# Patient Record
Sex: Female | Born: 1955
Health system: Southern US, Community
[De-identification: ages and names within clinical notes are randomized; demographics above are authoritative.]

## PROBLEM LIST (undated history)

## (undated) DIAGNOSIS — I2510 Atherosclerotic heart disease of native coronary artery without angina pectoris: Secondary | ICD-10-CM

## (undated) DIAGNOSIS — K759 Inflammatory liver disease, unspecified: Secondary | ICD-10-CM

## (undated) DIAGNOSIS — R200 Anesthesia of skin: Secondary | ICD-10-CM

## (undated) DIAGNOSIS — H3320 Serous retinal detachment, unspecified eye: Secondary | ICD-10-CM

## (undated) DIAGNOSIS — I10 Essential (primary) hypertension: Secondary | ICD-10-CM

## (undated) DIAGNOSIS — J342 Deviated nasal septum: Secondary | ICD-10-CM

## (undated) DIAGNOSIS — R112 Nausea with vomiting, unspecified: Secondary | ICD-10-CM

## (undated) DIAGNOSIS — R42 Dizziness and giddiness: Secondary | ICD-10-CM

## (undated) DIAGNOSIS — D649 Anemia, unspecified: Secondary | ICD-10-CM

## (undated) DIAGNOSIS — H469 Unspecified optic neuritis: Secondary | ICD-10-CM

## (undated) DIAGNOSIS — F419 Anxiety disorder, unspecified: Secondary | ICD-10-CM

## (undated) DIAGNOSIS — S83209A Unspecified tear of unspecified meniscus, current injury, unspecified knee, initial encounter: Secondary | ICD-10-CM

## (undated) DIAGNOSIS — G5600 Carpal tunnel syndrome, unspecified upper limb: Secondary | ICD-10-CM

## (undated) DIAGNOSIS — K219 Gastro-esophageal reflux disease without esophagitis: Secondary | ICD-10-CM

## (undated) DIAGNOSIS — R350 Frequency of micturition: Secondary | ICD-10-CM

## (undated) DIAGNOSIS — R413 Other amnesia: Secondary | ICD-10-CM

## (undated) DIAGNOSIS — J189 Pneumonia, unspecified organism: Secondary | ICD-10-CM

## (undated) DIAGNOSIS — Z87442 Personal history of urinary calculi: Secondary | ICD-10-CM

## (undated) DIAGNOSIS — M1990 Unspecified osteoarthritis, unspecified site: Secondary | ICD-10-CM

## (undated) DIAGNOSIS — D219 Benign neoplasm of connective and other soft tissue, unspecified: Secondary | ICD-10-CM

## (undated) DIAGNOSIS — Z9889 Other specified postprocedural states: Secondary | ICD-10-CM

## (undated) DIAGNOSIS — E7800 Pure hypercholesterolemia, unspecified: Secondary | ICD-10-CM

## (undated) DIAGNOSIS — R0600 Dyspnea, unspecified: Secondary | ICD-10-CM

## (undated) DIAGNOSIS — H3530 Unspecified macular degeneration: Secondary | ICD-10-CM

## (undated) DIAGNOSIS — M4800 Spinal stenosis, site unspecified: Secondary | ICD-10-CM

## (undated) DIAGNOSIS — A4902 Methicillin resistant Staphylococcus aureus infection, unspecified site: Secondary | ICD-10-CM

## (undated) HISTORY — DX: Deviated nasal septum: J34.2

## (undated) HISTORY — PX: KNEE SURGERY: SHX244

## (undated) HISTORY — DX: Spinal stenosis, site unspecified: M48.00

## (undated) HISTORY — PX: BACK SURGERY: SHX140

## (undated) HISTORY — DX: Unspecified tear of unspecified meniscus, current injury, unspecified knee, initial encounter: S83.209A

## (undated) HISTORY — DX: Other amnesia: R41.3

## (undated) HISTORY — PX: CATARACT EXTRACTION: SUR2

## (undated) HISTORY — DX: Methicillin resistant Staphylococcus aureus infection, unspecified site: A49.02

## (undated) HISTORY — PX: OTHER SURGICAL HISTORY: SHX169

## (undated) HISTORY — PX: RETINAL DETACHMENT SURGERY: SHX105

## (undated) HISTORY — DX: Benign neoplasm of connective and other soft tissue, unspecified: D21.9

## (undated) HISTORY — DX: Pure hypercholesterolemia, unspecified: E78.00

---

## 2001-03-06 ENCOUNTER — Observation Stay (HOSPITAL_COMMUNITY): Admission: RE | Admit: 2001-03-06 | Discharge: 2001-03-07 | Payer: Self-pay | Admitting: Ophthalmology

## 2001-03-06 ENCOUNTER — Encounter: Payer: Self-pay | Admitting: Ophthalmology

## 2001-03-06 DIAGNOSIS — H3320 Serous retinal detachment, unspecified eye: Secondary | ICD-10-CM

## 2001-03-06 HISTORY — DX: Serous retinal detachment, unspecified eye: H33.20

## 2001-03-06 HISTORY — PX: RETINAL DETACHMENT SURGERY: SHX105

## 2005-10-10 ENCOUNTER — Ambulatory Visit (HOSPITAL_COMMUNITY): Admission: RE | Admit: 2005-10-10 | Discharge: 2005-10-10 | Payer: Self-pay | Admitting: Orthopedic Surgery

## 2006-06-06 ENCOUNTER — Encounter: Admission: RE | Admit: 2006-06-06 | Discharge: 2006-06-06 | Payer: Self-pay | Admitting: Orthopedic Surgery

## 2008-04-30 ENCOUNTER — Encounter: Admission: RE | Admit: 2008-04-30 | Discharge: 2008-04-30 | Payer: Self-pay | Admitting: Internal Medicine

## 2009-06-02 ENCOUNTER — Encounter: Admission: RE | Admit: 2009-06-02 | Discharge: 2009-06-02 | Payer: Self-pay | Admitting: Family Medicine

## 2010-06-06 ENCOUNTER — Encounter: Admission: RE | Admit: 2010-06-06 | Discharge: 2010-06-06 | Payer: Self-pay | Admitting: *Deleted

## 2010-10-25 ENCOUNTER — Other Ambulatory Visit: Payer: Self-pay | Admitting: Orthopedic Surgery

## 2010-10-25 DIAGNOSIS — M545 Low back pain, unspecified: Secondary | ICD-10-CM

## 2010-11-01 ENCOUNTER — Ambulatory Visit
Admission: RE | Admit: 2010-11-01 | Discharge: 2010-11-01 | Disposition: A | Discharge status: Home / Self Care | Payer: Medicare Other | Source: Ambulatory Visit | Attending: Orthopedic Surgery | Admitting: Orthopedic Surgery

## 2010-11-01 DIAGNOSIS — M545 Low back pain: Secondary | ICD-10-CM

## 2010-11-08 ENCOUNTER — Ambulatory Visit
Admission: RE | Admit: 2010-11-08 | Discharge: 2010-11-08 | Disposition: A | Discharge status: Home / Self Care | Payer: Medicare Other | Source: Ambulatory Visit | Attending: Orthopedic Surgery | Admitting: Orthopedic Surgery

## 2011-02-10 NOTE — H&P (Signed)
[place]. [facility]  Patient:    [name], [name]                       MRN: [id number] Adm. Date:  [id number] Disc. Date: [id number] Attending:  Ivor Messier                         History and Physical  HISTORY:  This was an urgent outpatient admission of this 55 year old white female admitted with a rhegmatogenous retinal detachment of the right eye with multiple breaks.  HISTORY OF PRESENT ILLNESS:  This patient began to note the onset of flashing lights and floaters in her right eye over one months duration.  The patient then began to note the onset of a subsequent visual field defect in the right eye inferiorly.  The patient was seen by Dr. Stark Klein, [facility] optometrist, who noted a retinal detachment.  The patient was referred to my office on March 06, 2001 and this diagnosis confirmed.  Arrangements were made for her outpatient admission at that time.  PAST MEDICAL HISTORY:  The patient is in stable general health at the present time, taking Allegra D for allergies.  REVIEW OF SYSTEMS:  No cardiorespiratory complaints.  PHYSICAL EXAMINATION:  GENERAL:  The patient is a pleasant, well-nourished, well-developed white female in acute ocular distress.  VITAL SIGNS:  As recorded on admission.  Blood pressure 114/45, heart rate 85.  HEENT:  Eyes - Visual acuity without correction, less than 20/400 in the right eye, less than 20/400 in the left eye.  With correction, 20/30 in the right eye and 20/20 in the left eye.  Applanation tonometry 16 mm each eye. External ocular and slit lamp examination normal.  Detailed fundal examination dilated with Mydriacyl ophthalmic solution, right eye vitreous clear, superior rhegmatogenous retinal detachment.  Two irregular retinal tears are located at the 11:30 and 12 oclock position.  The macular area is still attached.  The OD detachment descends downward at the edge of the macula.  LUNGS:  Clear  to auscultation and percussion.  HEART:  Normal sinus rhythm.  No cardiomegaly.  No murmurs.  ABDOMEN:  Negative.  EXTREMITIES:  Negative.  ADMISSION DIAGNOSIS:  Rhegmatogenous retinal detachment, right eye.  Multiple defects.  SURGICAL PLAN:  Scleral buckling procedure, right eye, with possible vitrectomy.DD:  04/07/01 TD:  04/07/01 Job: [id number]

## 2011-02-10 NOTE — Op Note (Signed)
[place]. [facility]  Patient:    [name], [name]                       MRN: [id number] Proc. Date: 03/06/01 Adm. Date:  [id number] Disc. Date: [id number] Attending:  Ivor Messier                           Operative Report  PREOPERATIVE DIAGNOSES:  Rhegmatogenous retinal detachment right eye with multiple defects.  POSTOPERATIVE DIAGNOSES:  Rhegmatogenous retinal detachment right eye with multiple defects.  OPERATION: 1. Scleral buckling procedure, right eye, using solid silicone implants #277    and #240. 2. Cryo application. 3. Diathermy application. 4. External drainage of subretinal fluid. 5. Intravitreal C3F8 injection.  SURGEON:  Guadelupe Sabin, M.D.  ASSISTANT:  Nurse.  ANESTHESIA:  General.  OPHTHALMOSCOPY:  As previously described.  OPERATIVE PROCEDURE:  After the patient was prepped and draped, lid traction sutures were placed in the right upper and lower lids.  A lid speculum was inserted.  A peritomy was performed adjacent to the limbus 360 degrees.  The rectus muscles were isolated and looped with 4-0 silk traction sutures. The sclera was inspected and felt to be in satisfactory condition for the upper post lamellar scleral dissection.  Localization of the retinal breaks at the 11:30 and 12 oclock positions was verified and each tear treated with direct Cryo application.  It was then elected to perform lamellar scleral dissection from the 10 to 2 oclock position, the bed measuring 9 mm in width.  Light diathermy applications were applied.  A total of four 4-0 green Mersilene sutures were used to close the flaps loosely over a trimmed #277 solid silicone implant.  A #240 solid silicone encircling band was tied with two sutures at the 7:30 and 8 oclock position.  Anchoring sutures of 5-0 white Dacron were placed at the 8 and 4 oclock position.  The fundus was inspected and it was elected to drain fluid through the sclera at  the 11 oclock position.  An incision was made through the inner scleral lamella.  The choroid was treated with direct diathermy application and then perforated with the pen electrode.  A moderate amount of clear subretinal fluid was drained and then stopped.  The scleral buckle sutures were then pulled up, creating a superior indentation.  The tension of the encircling band was adjusted.  There was still remaining some residual folds and fluid on the implant surface and the tears appeared to be slightly still open.  It was therefore elected to perform an intravitreal C3F8 injection of 0.4 cc of 100% C3F8 after a paracentesis had been performed to lower the intraocular pressure further and the patient given Diamox 500 mg.  Indirect ophthalmoscopy revealed a good bubble created with the C3F8.  The intraocular pressure was monitored and optic nerve perfusion was monitored with indirect ophthalmoscopy intermittently.  There always appeared to be sufficient retinal artery perfusion.  It was then elected to close.  Tenons capsule was pulled forward and the four quadrants were tied as a separate layer with a 6-0 chromic CT gut suture.  The conjunctiva was then pulled forward and closed with a running 6-0 chromic cat gut suture.  Depo, garamycin and Celestone were injected in the subtenons space inferiorly.  A light patch and protector shield was applied to the right eye with Maxitrol and Atropine ointment.  DURATION OF PROCEDURE:  1-1/2 hours.  DISPOSITION:  The patient tolerated the procedure well in general and left the operating room for the recovery room in good condition. DD:  04/07/01 TD:  04/07/01 Job: [id number]

## 2011-02-10 NOTE — Discharge Summary (Signed)
[place]. [facility]  Patient:    [name], [name]                       MRN: [id number] Adm. Date:  [id number] Disc. Date: [id number] Attending:  Ivor Messier                           Discharge Summary  HISTORY OF PRESENT ILLNESS:  This was an urgent outpatient admission of this 55 year old white female admitted with a rhegmatogenous retinal detachment of the right eye with multiple breaks (see detailed admission history and physical).  HOSPITAL COURSE:  The patient was evaluated preoperatively and felt to be in satisfactory condition for the proposed surgery.  She therefore was taken into the operating room, where a complex scleral buckling procedure was performed using solid silicone implants with cryo application, diathermy application, external drainage of subretinal fluid and intravitreal C3F8 injection.  The patient tolerated the 1-1/2 hour procedure under general anesthesia well and was taken to the recovery room and subsequently to the 23-hour observation unit.  The patient was seen the following morning and felt to be doing satisfactory.  The residual subretinal fluid had absorbed and a good, smooth buckling indentation created superiorly.  The retinal breaks appeared to be in good position on the buckle.  It was felt that the patient could be discharged on limited activity and topical medications consisting of TobraDex and Cyclomydril ophthalmic solutions, one drop q.i.d. five minutes apart and Maxitrol and Atropine ointment at bed time.  CONDITION ON DISCHARGE:  Improved.  DISCHARGE DIAGNOSIS:  Rhegmatogenous retinal detachment right eye with multiple defects.  FOLLOW-UP APPOINTMENT:  In my office in 5-7 days. DD:  04/07/01 TD:  04/07/01 Job: [id number]

## 2011-05-02 ENCOUNTER — Other Ambulatory Visit: Payer: Self-pay | Admitting: Family Medicine

## 2011-05-02 DIAGNOSIS — Z1231 Encounter for screening mammogram for malignant neoplasm of breast: Secondary | ICD-10-CM

## 2011-06-08 ENCOUNTER — Ambulatory Visit
Admission: RE | Admit: 2011-06-08 | Discharge: 2011-06-08 | Disposition: A | Discharge status: Home / Self Care | Payer: Medicare Other | Source: Ambulatory Visit | Attending: Family Medicine | Admitting: Family Medicine

## 2011-06-08 DIAGNOSIS — Z1231 Encounter for screening mammogram for malignant neoplasm of breast: Secondary | ICD-10-CM

## 2011-12-12 DIAGNOSIS — H02839 Dermatochalasis of unspecified eye, unspecified eyelid: Secondary | ICD-10-CM | POA: Insufficient documentation

## 2011-12-12 DIAGNOSIS — Q078 Other specified congenital malformations of nervous system: Secondary | ICD-10-CM | POA: Diagnosis present

## 2011-12-12 DIAGNOSIS — H5210 Myopia, unspecified eye: Secondary | ICD-10-CM | POA: Insufficient documentation

## 2011-12-12 DIAGNOSIS — H43819 Vitreous degeneration, unspecified eye: Secondary | ICD-10-CM | POA: Insufficient documentation

## 2011-12-12 DIAGNOSIS — H02889 Meibomian gland dysfunction of unspecified eye, unspecified eyelid: Secondary | ICD-10-CM | POA: Insufficient documentation

## 2011-12-12 DIAGNOSIS — Z8669 Personal history of other diseases of the nervous system and sense organs: Secondary | ICD-10-CM | POA: Insufficient documentation

## 2011-12-12 DIAGNOSIS — H01009 Unspecified blepharitis unspecified eye, unspecified eyelid: Secondary | ICD-10-CM | POA: Insufficient documentation

## 2011-12-12 DIAGNOSIS — H5213 Myopia, bilateral: Secondary | ICD-10-CM | POA: Insufficient documentation

## 2011-12-12 DIAGNOSIS — H25819 Combined forms of age-related cataract, unspecified eye: Secondary | ICD-10-CM | POA: Insufficient documentation

## 2012-04-30 ENCOUNTER — Other Ambulatory Visit: Payer: Self-pay | Admitting: Internal Medicine

## 2012-04-30 DIAGNOSIS — Z1231 Encounter for screening mammogram for malignant neoplasm of breast: Secondary | ICD-10-CM

## 2012-06-20 ENCOUNTER — Ambulatory Visit
Admission: RE | Admit: 2012-06-20 | Discharge: 2012-06-20 | Disposition: A | Discharge status: Home / Self Care | Payer: Medicare Other | Source: Ambulatory Visit | Attending: Internal Medicine | Admitting: Internal Medicine

## 2012-06-20 DIAGNOSIS — Z1231 Encounter for screening mammogram for malignant neoplasm of breast: Secondary | ICD-10-CM

## 2013-05-20 ENCOUNTER — Other Ambulatory Visit: Payer: Self-pay

## 2013-05-20 DIAGNOSIS — Z1231 Encounter for screening mammogram for malignant neoplasm of breast: Secondary | ICD-10-CM

## 2013-06-24 ENCOUNTER — Ambulatory Visit
Admission: RE | Admit: 2013-06-24 | Discharge: 2013-06-24 | Disposition: A | Discharge status: Home / Self Care | Payer: Medicare Other | Source: Ambulatory Visit

## 2013-06-24 DIAGNOSIS — Z1231 Encounter for screening mammogram for malignant neoplasm of breast: Secondary | ICD-10-CM

## 2013-12-22 DIAGNOSIS — Z961 Presence of intraocular lens: Secondary | ICD-10-CM | POA: Insufficient documentation

## 2014-05-25 ENCOUNTER — Other Ambulatory Visit: Payer: Self-pay

## 2014-05-25 DIAGNOSIS — Z1231 Encounter for screening mammogram for malignant neoplasm of breast: Secondary | ICD-10-CM

## 2014-06-25 ENCOUNTER — Ambulatory Visit
Admission: RE | Admit: 2014-06-25 | Discharge: 2014-06-25 | Disposition: A | Discharge status: Home / Self Care | Payer: 59 | Source: Ambulatory Visit

## 2014-06-25 DIAGNOSIS — Z1231 Encounter for screening mammogram for malignant neoplasm of breast: Secondary | ICD-10-CM

## 2015-01-21 DIAGNOSIS — M79606 Pain in leg, unspecified: Secondary | ICD-10-CM | POA: Insufficient documentation

## 2015-01-21 DIAGNOSIS — I209 Angina pectoris, unspecified: Secondary | ICD-10-CM | POA: Insufficient documentation

## 2015-01-21 DIAGNOSIS — R079 Chest pain, unspecified: Secondary | ICD-10-CM | POA: Insufficient documentation

## 2015-05-28 ENCOUNTER — Other Ambulatory Visit: Payer: Self-pay

## 2015-05-28 DIAGNOSIS — Z1231 Encounter for screening mammogram for malignant neoplasm of breast: Secondary | ICD-10-CM

## 2015-06-28 ENCOUNTER — Ambulatory Visit
Admission: RE | Admit: 2015-06-28 | Discharge: 2015-06-28 | Disposition: A | Discharge status: Home / Self Care | Payer: Medicare Other | Source: Ambulatory Visit

## 2015-06-28 DIAGNOSIS — Z1231 Encounter for screening mammogram for malignant neoplasm of breast: Secondary | ICD-10-CM

## 2015-07-09 ENCOUNTER — Encounter (HOSPITAL_COMMUNITY): Payer: Self-pay | Admitting: Nurse Practitioner

## 2015-07-09 ENCOUNTER — Inpatient Hospital Stay (HOSPITAL_COMMUNITY)
Admission: EM | Admit: 2015-07-09 | Discharge: 2015-07-13 | DRG: 123 | Disposition: A | Discharge status: Home / Self Care | Payer: Medicare Other | Attending: Internal Medicine | Admitting: Internal Medicine

## 2015-07-09 ENCOUNTER — Emergency Department (HOSPITAL_COMMUNITY): Payer: Medicare Other

## 2015-07-09 DIAGNOSIS — I10 Essential (primary) hypertension: Secondary | ICD-10-CM | POA: Diagnosis present

## 2015-07-09 DIAGNOSIS — R519 Headache, unspecified: Secondary | ICD-10-CM

## 2015-07-09 DIAGNOSIS — Q078 Other specified congenital malformations of nervous system: Secondary | ICD-10-CM | POA: Diagnosis present

## 2015-07-09 DIAGNOSIS — H47011 Ischemic optic neuropathy, right eye: Principal | ICD-10-CM | POA: Diagnosis present

## 2015-07-09 DIAGNOSIS — I739 Peripheral vascular disease, unspecified: Secondary | ICD-10-CM | POA: Diagnosis present

## 2015-07-09 DIAGNOSIS — H547 Unspecified visual loss: Secondary | ICD-10-CM | POA: Diagnosis not present

## 2015-07-09 DIAGNOSIS — H468 Other optic neuritis: Secondary | ICD-10-CM | POA: Diagnosis present

## 2015-07-09 DIAGNOSIS — M1990 Unspecified osteoarthritis, unspecified site: Secondary | ICD-10-CM | POA: Diagnosis present

## 2015-07-09 DIAGNOSIS — H469 Unspecified optic neuritis: Secondary | ICD-10-CM

## 2015-07-09 DIAGNOSIS — R51 Headache: Secondary | ICD-10-CM

## 2015-07-09 DIAGNOSIS — H538 Other visual disturbances: Secondary | ICD-10-CM

## 2015-07-09 HISTORY — DX: Unspecified osteoarthritis, unspecified site: M19.90

## 2015-07-09 HISTORY — DX: Serous retinal detachment, unspecified eye: H33.20

## 2015-07-09 HISTORY — DX: Essential (primary) hypertension: I10

## 2015-07-09 LAB — BASIC METABOLIC PANEL
ANION GAP: 7 (ref 5–15)
BUN: 25 mg/dL — AB (ref 6–20)
CHLORIDE: 104 mmol/L (ref 101–111)
CO2: 26 mmol/L (ref 22–32)
CREATININE: 0.8 mg/dL (ref 0.44–1.00)
Calcium: 9.6 mg/dL (ref 8.9–10.3)
GFR calc Af Amer: >60 mL/min (ref >60)
GFR calc non Af Amer: >60 mL/min (ref >60)
Glucose, Bld: 115 mg/dL — ABNORMAL HIGH (ref 65–99)
POTASSIUM: 4.2 mmol/L (ref 3.5–5.1)
SODIUM: 137 mmol/L (ref 135–145)

## 2015-07-09 LAB — CBC WITH DIFFERENTIAL/PLATELET
BASOS ABS: 0.1 10*3/uL (ref 0.0–0.1)
Basophils Relative: 1 %
EOS ABS: 0.5 10*3/uL (ref 0.0–0.7)
EOS PCT: 5 %
HCT: 40.7 % (ref 36.0–46.0)
HEMOGLOBIN: 13.5 g/dL (ref 12.0–15.0)
LYMPHS PCT: 31 %
Lymphs Abs: 2.9 10*3/uL (ref 0.7–4.0)
MCH: 31.6 pg (ref 26.0–34.0)
MCHC: 33.2 g/dL (ref 30.0–36.0)
MCV: 95.3 fL (ref 78.0–100.0)
Monocytes Absolute: 0.7 10*3/uL (ref 0.1–1.0)
Monocytes Relative: 8 %
NEUTROS PCT: 55 %
Neutro Abs: 5.1 10*3/uL (ref 1.7–7.7)
PLATELETS: 253 10*3/uL (ref 150–400)
RBC: 4.27 MIL/uL (ref 3.87–5.11)
RDW: 13.8 % (ref 11.5–15.5)
WBC: 9.2 10*3/uL (ref 4.0–10.5)

## 2015-07-09 MED ORDER — LORAZEPAM 1 MG PO TABS
1.0000 mg | ORAL_TABLET | Freq: Once | ORAL | Status: AC
  Administered 2015-07-09: 1 mg via ORAL
  Filled 2015-07-09: qty 1

## 2015-07-09 NOTE — ED Notes (Signed)
MD at bedside. 

## 2015-07-09 NOTE — ED Notes (Signed)
She noticed this am when she woke she had a sensation that something was in her R eye, then approximately 1700 today she lost vision in her R eye, this has remained since onset. She went to her eye doctor and they sent for r/o optic neuritis but told retina was intact. shes had retinal detachment in this eye in the past. seh describes a gray film over R eye that she can only see "bits and pieces through". She is A&Ox4, speech is clear, grips = bilaterally.

## 2015-07-09 NOTE — ED Provider Notes (Signed)
CSN: [id number]     Arrival date & time 07/09/15  1829 History   First MD Initiated Contact with Patient 07/09/15 1934     Chief Complaint  Patient presents with  . Loss of Vision     (Consider location/radiation/quality/duration/timing/severity/associated sxs/prior Treatment) HPI Comments: Patient presents with vision changes in the right eye for the past day. She describes a gray film with "bits and pieces showing through". She saw her ophthalmologist Dr. Alethia Berthold who performed a complete exam and told her there was no retinal detachment. She has had a retinal detachment in the past and had surgery. She denies any eye pain. She was sent over to rule out optic neuritis. She denies any fevers, chills, nausea or vomiting. She denies any headache. Her ophthalmologist was also concerned about her history of tick bites this past summer and possible Lyme disease.  She describes a gray film over the vision in her right eye. She was told that her macula and retinal were normal by her ophthalmologist.  The history is provided by the patient.    Past Medical History  Diagnosis Date  . Retinal detachment   . Hypertension   . Arthritis    No past surgical history on file. No family history on file. Social History  Substance Use Topics  . Smoking status: Never Smoker   . Smokeless tobacco: Not on file  . Alcohol Use: No   OB History    No data available     Review of Systems  Constitutional: Negative for fever, activity change, appetite change and fatigue.  HENT: Negative for congestion and postnasal drip.   Eyes: Positive for visual disturbance. Negative for pain.  Respiratory: Negative for cough, chest tightness and shortness of breath.   Cardiovascular: Negative for chest pain.  Gastrointestinal: Negative for nausea, vomiting and abdominal pain.  Genitourinary: Negative for dysuria, hematuria, vaginal bleeding and vaginal discharge.  Musculoskeletal: Negative for myalgias and  arthralgias.  Skin: Negative for rash.  Neurological: Negative for dizziness, weakness and headaches.  A complete 10 system review of systems was obtained and all systems are negative except as noted in the HPI and PMH.      Allergies  Review of patient's allergies indicates no known allergies.  Home Medications   Prior to Admission medications   Medication Sig Start Date End Date Taking? Authorizing Provider  CALCIUM PO Take 1 tablet by mouth daily.   Yes Historical Provider, MD  carvedilol (COREG) 12.5 MG tablet Take 12.5 mg by mouth 2 (two) times daily with a meal.   Yes Historical Provider, MD  diclofenac (VOLTAREN) 75 MG EC tablet Take 75 mg by mouth daily.   Yes Historical Provider, MD  ibuprofen (ADVIL,MOTRIN) 200 MG tablet Take 200 mg by mouth every 6 (six) hours as needed. For knee pain   Yes Historical Provider, MD  Multiple Vitamin (MULTIVITAMIN WITH MINERALS) TABS tablet Take 1 tablet by mouth daily.   Yes Historical Provider, MD   BP 148/79 mmHg  Pulse 76  Temp(Src) 98 F (36.7 C) (Oral)  Resp 20  SpO2 98% Physical Exam  Constitutional: She is oriented to person, place, and time. She appears well-developed and well-nourished. No distress.  HENT:  Head: Normocephalic and atraumatic.  Mouth/Throat: Oropharynx is clear and moist. No oropharyngeal exudate.  Eyes: Conjunctivae and EOM are normal. Pupils are equal, round, and reactive to light.  Fundoscopic exam:      The right eye shows no papilledema.  The left eye shows no papilledema.  Pharmacologically dilated. Funduscopic exam appears to be normal without papilledema.  Neck: Normal range of motion. Neck supple.  No meningismus.  Cardiovascular: Normal rate, regular rhythm, normal heart sounds and intact distal pulses.   No murmur heard. Pulmonary/Chest: Effort normal and breath sounds normal. No respiratory distress.  Abdominal: Soft. There is no tenderness. There is no rebound and no guarding.   Musculoskeletal: Normal range of motion. She exhibits no edema or tenderness.  Neurological: She is alert and oriented to person, place, and time. No cranial nerve deficit. She exhibits normal muscle tone. Coordination normal.  No ataxia on finger to nose bilaterally. No pronator drift. 5/5 strength throughout. CN 2-12 intact. Negative Romberg. Equal grip strength. Sensation intact. Gait is normal.   Skin: Skin is warm.  Psychiatric: She has a normal mood and affect. Her behavior is normal.  Nursing note and vitals reviewed.   ED Course  Procedures (including critical care time) Labs Review Labs Reviewed  BASIC METABOLIC PANEL - Abnormal; Notable for the following:    Glucose, Bld 115 (*)    BUN 25 (*)    All other components within normal limits  CBC WITH DIFFERENTIAL/PLATELET  B. BURGDORFI ANTIBODIES    Imaging Review No results found. I have personally reviewed and evaluated these images and lab results as part of my medical decision-making.   EKG Interpretation None      MDM   Final diagnoses:  Vision loss   Vision change in right eye with concern for optic neuritis. Retina normal per ophthalmology exam earlier today.  MRI will be obtained to evaluate for possible optic neuritis. MS considered as well. No retinal detachment seen on bedside ultrasound.  No other neurological findings to suggest acute stroke.  MRI brain and orbits pending at time of sign out to Dr. Claudine Mouton. Will likely need neurology consult.  Dr. Doy Mince aware.   EMERGENCY DEPARTMENT [place] OCULAR EXAM "Study: Limited Ultrasound of Orbit "  INDICATIONS: Vision loss and Floaters/Flashes  Linear probe utilized to obtain images in both long and short axis of the orbit having the patient look left and right if possible.  PERFORMED BY: Myself  IMAGES ARCHIVED?: Yes  LIMITATIONS: none  VIEWS USED: Right orbit  INTERPRETATION: No retinal detachment, Lens in proper position    Ezequiel Essex,  MD 07/10/15 [phone]

## 2015-07-10 ENCOUNTER — Encounter (HOSPITAL_COMMUNITY): Payer: Self-pay | Admitting: Family Medicine

## 2015-07-10 DIAGNOSIS — I10 Essential (primary) hypertension: Secondary | ICD-10-CM | POA: Diagnosis present

## 2015-07-10 DIAGNOSIS — I739 Peripheral vascular disease, unspecified: Secondary | ICD-10-CM | POA: Diagnosis present

## 2015-07-10 DIAGNOSIS — M1990 Unspecified osteoarthritis, unspecified site: Secondary | ICD-10-CM | POA: Diagnosis present

## 2015-07-10 DIAGNOSIS — H538 Other visual disturbances: Secondary | ICD-10-CM | POA: Diagnosis not present

## 2015-07-10 DIAGNOSIS — H47011 Ischemic optic neuropathy, right eye: Secondary | ICD-10-CM | POA: Diagnosis present

## 2015-07-10 DIAGNOSIS — H547 Unspecified visual loss: Secondary | ICD-10-CM | POA: Diagnosis present

## 2015-07-10 DIAGNOSIS — H469 Unspecified optic neuritis: Secondary | ICD-10-CM | POA: Diagnosis not present

## 2015-07-10 DIAGNOSIS — H468 Other optic neuritis: Secondary | ICD-10-CM | POA: Diagnosis present

## 2015-07-10 HISTORY — DX: Essential (primary) hypertension: I10

## 2015-07-10 LAB — SEDIMENTATION RATE: Sed Rate: 35 mm/hr — ABNORMAL HIGH (ref 0–22)

## 2015-07-10 MED ORDER — GADOBENATE DIMEGLUMINE 529 MG/ML IV SOLN
20.0000 mL | Freq: Once | INTRAVENOUS | Status: AC | PRN
  Administered 2015-07-10: 18 mL via INTRAVENOUS

## 2015-07-10 MED ORDER — PANTOPRAZOLE SODIUM 40 MG PO TBEC
40.0000 mg | DELAYED_RELEASE_TABLET | Freq: Two times a day (BID) | ORAL | Status: DC
  Administered 2015-07-10 – 2015-07-13 (×7): 40 mg via ORAL
  Filled 2015-07-10 (×8): qty 1

## 2015-07-10 MED ORDER — ENOXAPARIN SODIUM 40 MG/0.4ML ~~LOC~~ SOLN
40.0000 mg | SUBCUTANEOUS | Status: DC
Start: 2015-07-10 — End: 2015-07-13
  Administered 2015-07-10 – 2015-07-13 (×4): 40 mg via SUBCUTANEOUS
  Filled 2015-07-10 (×4): qty 0.4

## 2015-07-10 MED ORDER — ACETAMINOPHEN 325 MG PO TABS
650.0000 mg | ORAL_TABLET | Freq: Four times a day (QID) | ORAL | Status: DC | PRN
  Administered 2015-07-10: 650 mg via ORAL
  Filled 2015-07-10 (×2): qty 2

## 2015-07-10 MED ORDER — DICLOFENAC SODIUM 75 MG PO TBEC
75.0000 mg | DELAYED_RELEASE_TABLET | Freq: Every day | ORAL | Status: DC
  Filled 2015-07-10: qty 1

## 2015-07-10 MED ORDER — SODIUM CHLORIDE 0.9 % IV SOLN
250.0000 mg | Freq: Four times a day (QID) | INTRAVENOUS | Status: DC
  Administered 2015-07-10 – 2015-07-12 (×8): 250 mg via INTRAVENOUS
  Filled 2015-07-10 (×12): qty 2

## 2015-07-10 MED ORDER — CARVEDILOL 12.5 MG PO TABS
12.5000 mg | ORAL_TABLET | Freq: Two times a day (BID) | ORAL | Status: DC
Start: 2015-07-10 — End: 2015-07-13
  Administered 2015-07-10 – 2015-07-13 (×7): 12.5 mg via ORAL
  Filled 2015-07-10 (×7): qty 1

## 2015-07-10 NOTE — Progress Notes (Signed)
Patient trans in from ED to [place]. On arrival, alert and oriented and ambulatory. Here for right eye vision changes. Oriented to the room and call bell placed within reach. Will keep monitoring.

## 2015-07-10 NOTE — H&P (Signed)
History and Physical  [name]  LZJ:[id number]  DOB: 01/16/1956  DOA: 07/09/2015  Referring physician: Everlene Balls, MD PCP: Suzanna Obey, MD   Chief Complaint: Vision change  HPI: [name] is a 58 y.o. female with a past medical history significant for HTN and retinal detachment who presents with vision changes of right eye.  The patient was in her normal state of health until today around noon when she abruptly onset of visual defects in her right eye. This was painless, look like "gray patches" in front of things, and seem to be evolving. It was not associated with headache, fever, jaw pain, joint pain, slurred speech, focal weakness. She went to an ophthalmologist as records are not available to [place] who recommended that she present to the ER for urgent MRI.  In the ED, this MRI showed no stroke, white matter lesions, or evidence of optic neuritis. Evaluated by neurology who felt that her clinical exam was consistent with optic neuritis and warranted admission and empiric treatment with IV steroids.   Review of Systems:  Patient seen 4:58 AM on 07/10/2015. Pt complains of gray spots in front of her eyes. All other systems negative except as just noted or noted in the history of present illness.  Past Medical History  Diagnosis Date  . Retinal detachment   . Hypertension   . Arthritis   The above past medical history was reviewed.  History reviewed. No pertinent past surgical history.The above surgical history was reviewed.  Social History: Patient lives with her sister. She is a former Radio producer. She has never smoked or drunk.    No Known Allergies  Family History  Problem Relation Age of Onset  . Stroke Mother   . Arrhythmia Mother   . Stroke Maternal Grandfather   . Stroke Maternal Grandmother     Prior to Admission medications   Medication Sig Start Date End Date Taking? Authorizing Provider  CALCIUM PO Take 1 tablet by mouth daily.   Yes Historical  Provider, MD  carvedilol (COREG) 12.5 MG tablet Take 12.5 mg by mouth 2 (two) times daily with a meal.   Yes Historical Provider, MD  diclofenac (VOLTAREN) 75 MG EC tablet Take 75 mg by mouth daily.   Yes Historical Provider, MD  ibuprofen (ADVIL,MOTRIN) 200 MG tablet Take 200 mg by mouth every 6 (six) hours as needed. For knee pain   Yes Historical Provider, MD  Multiple Vitamin (MULTIVITAMIN WITH MINERALS) TABS tablet Take 1 tablet by mouth daily.   Yes Historical Provider, MD    Physical Exam: BP 176/84 mmHg  Pulse 76  Temp(Src) 98.1 F (36.7 C) (Oral)  Resp 16  SpO2 98% General appearance: Well-developed, adult female, alert and in no distress.  Responds appropriately to questions.   Eyes: Sclerae normal without icterus, conjunctiva pink, lids and lashes normal.  PERRL and EOMI.   Nose: No deformity, discharge, or epistaxis.   Lymph: No cervical, supraclavicular or axillary lymphadenopathy. Skin: Warm and dry.  No jaundice.  No suspicious rashes or lesions. Cardiac: RRR, nl S1-S2, no murmurs appreciated.   Respiratory: Normal respiratory rate and rhythm.  CTAB without rales or wheezes. Abdomen: BS present.  Abdomen soft without rigidity.  No TTP or rebound all quadrants. No ascites, distension.   Neuro: Sensorium intact. Moves all extremities equally and with normal coordination.    Psych: Appropriate affect.  Speech normal. Thought content/process linear/appropriate.  No evidence of aural or visual hallucinations or delusions.  Labs on Admission:  The metabolic panel is all within normal limits. The complete blood count is also all within normal limits.   Radiological Exams on Admission:  Mr Lanae Crumbly and Kizzie Fantasia Contrast 07/10/2015 IMPRESSION:  1. No evidence for acute optic neuritis or other acute optic nerve pathology.  2. Sequela of prior scleral banding at the right globe for presumed previous retinal detachment. No findings to suggest acute retinal detachment on  this examination.  3. No other acute intracranial process.  4. Mild chronic small vessel ischemic disease.     Assessment/Plan  1. Optic neuritis, suspected:  This is new.  There are no findings of MS.  The patient was evaluated by ophthalmology earlier today who did not feel that she had a retinal detachment again. Empiric treatment for optic neuritis. -IV methylprednisolone 250 mg every 6 for 3 days -We will rule out GCA -Will rule out Lyme, sarcoid  2. HTN:  Not controlled. -Continue home carvedilol -Monitored overnight and add second agent if needed     DVT PPx: Lovenox Diet: Regular Consultants: Neurology Code Status: Full  Disposition Plan:  At the time of admission, it appears that the appropriate admission status for this patient is INPATIENT. This is judged to be reasonable and necessary in order to provide the required intensity of service to ensure the patient's treatment for potentially organ threatening illness. At the time of admission, we expect her to receive 3 days of IV steroids before discharge.  Edwin Dada [facility] Pager [phone]

## 2015-07-10 NOTE — Progress Notes (Addendum)
Patient was admitted earlier during the day for worsening vision in right eye, H&P, neurology consult, orders, labs, imaging, was reviewed, patient was seen and examined.  Physical exam - Awake alert 3 - Pink conjunctiva, anicteric sclera,PERRLQ - S1-S2 plus, no rubs or gallops - Clear air entry bilaterally, no wheezing - Abdomen soft, nontender, nondistended - Extremity no edema no clubbing no cyanosis  Patient was seen by neurology, started empirically on IV steroids for suspicion of optic neuritis, MRI brain, MRI orbits also with no acute optic nerve findings or optic neuritis, Lyme  Disease Ab pending as she endorses tick bites, follow on ANA, ACE level, ESR is mildly elevated, ophthalmology service were consulted for further evaluation, - Patient started on CBG and PPI as she is on steroids. Phillips Climes MD [facility] Pager [phone]

## 2015-07-10 NOTE — Consult Note (Signed)
Reason for Consult:Decreased vision Referring Physician: Oni  CC: Decreased vision  HPI: [name] is an 59 y.o. female with a history of a right retinal detachment was watching television on yesterday around midday and began to notice that her vision was not as usual.  When she closed her left eye she noticed that it was as if she was looking at a jigsaw puzzle-some portions of her vision were visual and some portions were not.  Also as she looked around flashes of color were noticeable.  The patient was concerned that she had another retinal detachment.  She saw an ophthalmologist today.  Those records are not available but she as concerned about an optic neuritis.    Past Medical History  Diagnosis Date  . Retinal detachment   . Hypertension   . Arthritis     No past surgical history on file.  Family history: Mother deceased.  She has multiple strokes.  Her maternal grandmother and grandfather died of strokes as well.  Her father died of a MI.    Social History:  reports that she has never smoked. She does not have any smokeless tobacco history on file. She reports that she does not drink alcohol or use illicit drugs.  No Known Allergies  Medications: I have reviewed the patient's current medications. Prior to Admission:  Prior to Admission medications   Medication Sig Start Date End Date Taking? Authorizing Provider  CALCIUM PO Take 1 tablet by mouth daily.   Yes Historical Provider, MD  carvedilol (COREG) 12.5 MG tablet Take 12.5 mg by mouth 2 (two) times daily with a meal.   Yes Historical Provider, MD  diclofenac (VOLTAREN) 75 MG EC tablet Take 75 mg by mouth daily.   Yes Historical Provider, MD  ibuprofen (ADVIL,MOTRIN) 200 MG tablet Take 200 mg by mouth every 6 (six) hours as needed. For knee pain   Yes Historical Provider, MD  Multiple Vitamin (MULTIVITAMIN WITH MINERALS) TABS tablet Take 1 tablet by mouth daily.   Yes Historical Provider, MD    ROS: History obtained  from the patient  General ROS: negative for - chills, fatigue, fever, night sweats, weight gain or weight loss Psychological ROS: negative for - behavioral disorder, hallucinations, memory difficulties, mood swings or suicidal ideation Ophthalmic ROS: decreased vision ENT ROS: negative for - epistaxis, nasal discharge, oral lesions, sore throat, tinnitus or vertigo Allergy and Immunology ROS: negative for - hives or itchy/watery eyes Hematological and Lymphatic ROS: negative for - bleeding problems, bruising or swollen lymph nodes Endocrine ROS: negative for - galactorrhea, hair pattern changes, polydipsia/polyuria or temperature intolerance Respiratory ROS: negative for - cough, hemoptysis, shortness of breath or wheezing Cardiovascular ROS: bilateral lower extremity edema  Gastrointestinal ROS: negative for - abdominal pain, diarrhea, hematemesis, nausea/vomiting or stool incontinence Genito-Urinary ROS: negative for - dysuria, hematuria, incontinence or urinary frequency/urgency Musculoskeletal ROS: negative for - joint pain Neurological ROS: as noted in HPI Dermatological ROS: negative for rash and skin lesion changes  Physical Examination: Blood pressure 158/77, pulse 75, temperature 97.7 F (36.5 C), temperature source Oral, resp. rate 20, SpO2 98 %.  HEENT-  Normocephalic, no lesions, without obvious abnormality.  Normal external eye and conjunctiva.  Normal TM's bilaterally.  Normal auditory canals and external ears. Normal external nose, mucus membranes and septum.  Normal pharynx. Cardiovascular- S1, S2 normal, pulses palpable throughout   Lungs- chest clear, no wheezing, rales, normal symmetric air entry Abdomen- soft, non-tender; bowel sounds normal; no masses,  no  organomegaly Extremities- mild BLE edema Lymph-no adenopathy palpable Musculoskeletal-left knee tenderness Skin-warm and dry, no hyperpigmentation, vitiligo, or suspicious lesions  Neurological Examination Mental  Status: Alert, oriented, thought content appropriate.  Speech fluent without evidence of aphasia.  Able to follow 3 step commands without difficulty. Cranial Nerves: II: Discs flat bilaterally; Visual acuity both eyes tested at 20/30, right eye at 20/400, pupils equal, round, reactive to light and accommodation.  No APD III,IV, VI: ptosis not present, extra-ocular motions intact bilaterally V,VII: smile symmetric, facial light touch sensation normal bilaterally VIII: hearing normal bilaterally IX,X: gag reflex present XI: bilateral shoulder shrug XII: midline tongue extension Motor: Right : Upper extremity   5/5    Left:     Upper extremity   5/5  Lower extremity   5/5     Lower extremity   5/5 Tone and bulk:normal tone throughout; no atrophy noted Sensory: Pinprick and light touch intact throughout, bilaterally Deep Tendon Reflexes: 2+ and symmetric throughout Plantars: Right: downgoing   Left: downgoing Cerebellar: normal finger-to-nose and normal heel-to-shin testing bilaterally Gait: not tested due to safety concerns   Laboratory Studies:   Basic Metabolic Panel:  Recent Labs Lab 07/09/15 1957  NA 137  K 4.2  CL 104  CO2 26  GLUCOSE 115*  BUN 25*  CREATININE 0.80  CALCIUM 9.6    Liver Function Tests: No results for input(s): AST, ALT, ALKPHOS, BILITOT, PROT, ALBUMIN in the last 168 hours. No results for input(s): LIPASE, AMYLASE in the last 168 hours. No results for input(s): AMMONIA in the last 168 hours.  CBC:  Recent Labs Lab 07/09/15 1957  WBC 9.2  NEUTROABS 5.1  HGB 13.5  HCT 40.7  MCV 95.3  PLT 253    Cardiac Enzymes: No results for input(s): CKTOTAL, CKMB, CKMBINDEX, TROPONINI in the last 168 hours.  BNP: Invalid input(s): POCBNP  CBG: No results for input(s): GLUCAP in the last 168 hours.  Microbiology: No results found for this or any previous visit.  Coagulation Studies: No results for input(s): LABPROT, INR in the last 72  hours.  Urinalysis: No results for input(s): COLORURINE, LABSPEC, PHURINE, GLUCOSEU, HGBUR, BILIRUBINUR, KETONESUR, PROTEINUR, UROBILINOGEN, NITRITE, LEUKOCYTESUR in the last 168 hours.  Invalid input(s): APPERANCEUR  Lipid Panel:  No results found for: CHOL, TRIG, HDL, CHOLHDL, VLDL, LDLCALC  HgbA1C: No results found for: HGBA1C  Urine Drug Screen:  No results found for: LABOPIA, COCAINSCRNUR, LABBENZ, AMPHETMU, THCU, LABBARB  Alcohol Level: No results for input(s): ETH in the last 168 hours.   Imaging: Mr Jeri Cos LY Contrast  07/10/2015  CLINICAL DATA:  Initial valuation for acute visualized in right eye. History of retinal detachment, status post repair. EXAM: MRI HEAD WITHOUT AND WITH CONTRAST TECHNIQUE: Multiplanar, multiecho pulse sequences of the brain and surrounding structures were obtained without and with intravenous contrast. CONTRAST:  70m MULTIHANCE GADOBENATE DIMEGLUMINE 529 MG/ML IV SOLN COMPARISON:  None. FINDINGS: No abnormal foci of restricted diffusion to suggest acute intracranial infarct. Gray-white matter differentiation maintained. Normal intravascular flow voids are preserved. No acute or chronic intracranial hemorrhage. Cerebral volume within normal limits for patient age. Scattered T2/FLAIR hyperintensity within the periventricular, deep, and subcortical white matter present, nonspecific, but likely related to chronic small vessel ischemic disease. No mass lesion, midline shift or mass effect. No hydrocephalus. No extra-axial fluid collection. Craniocervical junction within normal limits. Pituitary gland normal. Dedicated views of the orbits demonstrate evidence of prior scleral banding for retinal detachment at the right globe. The right  globe itself is somewhat valve long and shape. The right lens has been replaced. No evidence for retinal detachment seen on this examination. Extraocular muscles are normal and symmetric bilaterally. Superior orbital veins symmetric and  within normal limits. The optic nerves themselves are symmetric in size without evidence for abnormal enhancement to suggest optic neuritis. Optic nerve cc demonstrate symmetric fluid. Optic chiasm is normal. No abnormal enhancement elsewhere within the brain. IMPRESSION: 1. No evidence for acute optic neuritis or other acute optic nerve pathology. 2. Sequela of prior scleral banding at the right globe for presumed previous retinal detachment. No findings to suggest acute retinal detachment on this examination. 3. No other acute intracranial process. 4. Mild chronic small vessel ischemic disease. Electronically Signed   By: Jeannine Boga M.D.   On: 07/10/2015 01:36   Mr Darnelle Catalan Wo/w Cm  07/10/2015  CLINICAL DATA:  Initial valuation for acute visualized in right eye. History of retinal detachment, status post repair. EXAM: MRI HEAD WITHOUT AND WITH CONTRAST TECHNIQUE: Multiplanar, multiecho pulse sequences of the brain and surrounding structures were obtained without and with intravenous contrast. CONTRAST:  19m MULTIHANCE GADOBENATE DIMEGLUMINE 529 MG/ML IV SOLN COMPARISON:  None. FINDINGS: No abnormal foci of restricted diffusion to suggest acute intracranial infarct. Gray-white matter differentiation maintained. Normal intravascular flow voids are preserved. No acute or chronic intracranial hemorrhage. Cerebral volume within normal limits for patient age. Scattered T2/FLAIR hyperintensity within the periventricular, deep, and subcortical white matter present, nonspecific, but likely related to chronic small vessel ischemic disease. No mass lesion, midline shift or mass effect. No hydrocephalus. No extra-axial fluid collection. Craniocervical junction within normal limits. Pituitary gland normal. Dedicated views of the orbits demonstrate evidence of prior scleral banding for retinal detachment at the right globe. The right globe itself is somewhat valve long and shape. The right lens has been replaced.  No evidence for retinal detachment seen on this examination. Extraocular muscles are normal and symmetric bilaterally. Superior orbital veins symmetric and within normal limits. The optic nerves themselves are symmetric in size without evidence for abnormal enhancement to suggest optic neuritis. Optic nerve cc demonstrate symmetric fluid. Optic chiasm is normal. No abnormal enhancement elsewhere within the brain. IMPRESSION: 1. No evidence for acute optic neuritis or other acute optic nerve pathology. 2. Sequela of prior scleral banding at the right globe for presumed previous retinal detachment. No findings to suggest acute retinal detachment on this examination. 3. No other acute intracranial process. 4. Mild chronic small vessel ischemic disease. Electronically Signed   By: BJeannine BogaM.D.   On: 07/10/2015 01:36     Assessment/Plan: 59year old female presenting with decreased visual acuity in the right eye.  Evaluated by ophthalmology today and not felt to be secondary to a retinal abnormality.  Based on history concern is for optic neuritis.  MRI of the brain performed with contrast and personally reviewed.  It showed no enhancement of the optic nerve.  No evidence of MS noted either.  With patient being early in the course of her symptoms and no records available from ophthalmology it was felt reasonable to treat the patient as if she had an optic neuritis.    Recommendations; 1.  Lyme titer, ANA, ESR, ACE level 2.  Solumedrol 2556mIV q 6 hours for 12 doses.    LeAlexis GoodellMD [facility] [phone]/15/2016, 3:49 AM

## 2015-07-10 NOTE — ED Notes (Signed)
Neurology at bedside.

## 2015-07-10 NOTE — ED Notes (Signed)
Pt just placed in pod C, requesting something to eat.  Ambulated to bathroom

## 2015-07-10 NOTE — ED Notes (Signed)
MD at bedside. 

## 2015-07-10 NOTE — Consult Note (Signed)
Reason for consult:  vision loss OD  HPI: [name] is an 59 y.o. female who has a past ocular history of high myopia, retinal detachment OD (repaired 2002 by Dr. Ishmael Holter), and cataract surgery OD (Dr. Bobette Mo at [facility] 2006.) She is seen yearly at [facility].  She also sees an optometrist (Dr. Quay Burow) at [facility] yearly.   She covered her left eye on Friday and noticed that the vision was poor OD.  She saw Dr. [place], who told her to go to ER for MRI to rule out optic neuropathy.  She has no notes or records from [facility], so I do not know the results of the testing she had yesterday.  Because it is now Saturday, I can not reach Dr. [place] or her staff.  She was started on IV steroids by neurology.  SHe has not noticed any improvement.  The patient admits that she is not sure of the time of onset of the vision change.  "It could have started much earlier in the week and I might not have noticed it until I covered my left eye on Friday."  She is concerned because of she had tick bite exposure this Spring.     Past Medical History  Diagnosis Date  . Retinal detachment   . Hypertension   . Arthritis    History reviewed. No pertinent past surgical history. Family History  Problem Relation Age of Onset  . Stroke Mother   . Arrhythmia Mother   . Stroke Maternal Grandfather   . Stroke Maternal Grandmother    Current Facility-Administered Medications  Medication Dose Route Frequency Provider Last Rate Last Dose  . carvedilol (COREG) tablet 12.5 mg  12.5 mg Oral BID WC Edwin Dada, MD   12.5 mg at 07/10/15 1056  . enoxaparin (LOVENOX) injection 40 mg  40 mg Subcutaneous Q24H Edwin Dada, MD   40 mg at 07/10/15 1056  . methylPREDNISolone sodium succinate (SOLU-MEDROL) 250 mg in sodium chloride 0.9 % 50 mL IVPB  250 mg Intravenous Q6H Alexis Goodell, MD   250 mg at 07/10/15 1232  . pantoprazole (PROTONIX) EC tablet 40 mg  40 mg Oral BID Albertine Patricia, MD   40 mg at  07/10/15 1056   No Known Allergies Social History   Social History  . Marital Status: Single    Spouse Name: N/A  . Number of Children: N/A  . Years of Education: N/A   Occupational History  . Not on file.   Social History Main Topics  . Smoking status: Never Smoker   . Smokeless tobacco: Not on file  . Alcohol Use: No  . Drug Use: No  . Sexual Activity: Not on file   Other Topics Concern  . Not on file   Social History Narrative    Review of systems: ROS General: no fever Neuro:  Mild headache a few days ago, no dizziness,no jaw claudication  Derm: no rash,  MS: Bilateral leg pain getting worse over the last year, chronic knee pain for years. CV: No chest pain, HTN not controlled well lately.  GI: no nausea or vomitting  Pulm: chronic SOB, HEENT: no hearing issues, no sinus congestion  Hem/onc: no night sweats, no history of CA, GU: no dysuria  Physical Exam:  Blood pressure 139/72, pulse 85, temperature 98.1 F (36.7 C), temperature source Oral, resp. rate 18, height 5' 6"  (1.676 m), weight 87.6 kg (193 lb 2 oz), SpO2 97 %.  VA cc near card:  OD  20/200  OS 20/30   Pupils:   OD round, reactive to light, + RAPD            OS round, reactive to light, no APD  IOP (T pen)  OD 17  OS  18  CVF: OD: restricted.  "I can only see a few spots in the center.  Like puzzle pieces.   OS: full to CF  Motility:  OD full ductions  OS full ductions  Balance/alignment:  Ortho by Luiz Ochoa   Bedside examination:                                 OD                                       External/adnexa: Normal                                      Lids/lashes:        Normal                                      Conjunctiva        White, quiet         Cornea:              Clear                  AC:                     Deep, quiet                                Iris:                     Normal        Lens:                  PCIOL                                       OS                                        External/adnexa: Normal                                      Lids/lashes:        Normal                                      Conjunctiva        White, quiet        Cornea:              Clear  AC:                     Deep, quiet                                Iris:                     Normal        Lens:                  Nuclear Sclerosis       Dilated fundus exam: (Neo 2.5; Myd 1%)      OD Vitreous            Marked vitreous degeneration                             Optic Disc:       Tilted, temporal PPA                      Macula:            Poor bedside view.  I do see a couple of yellow dots consistent with either drusen or macular scars                                           Vessels:           Normal caliber,distribution         Periphery:         Low scleral buckle, light peripheral LASER, no untreated tears.                                    OS Vitreous            Vitreous degeneration                              Optic Disc:       Normal, perfused                      Macula:             Flat                                            Vessels:           Normal caliber,distribution         Periphery:         Flat, attached        Labs/studies: Results for orders placed or performed during the hospital encounter of 07/09/15 (from the past 48 hour(s))  CBC with Differential/Platelet     Status: None   Collection Time: 07/09/15  7:57 PM  Result Value Ref Range   WBC 9.2 4.0 - 10.5 K/uL   RBC 4.27 3.87 - 5.11 MIL/uL   Hemoglobin 13.5 12.0 - 15.0 g/dL   HCT 40.7 36.0 - 46.0 %   MCV 95.3 78.0 - 100.0 fL   MCH 31.6  26.0 - 34.0 pg   MCHC 33.2 30.0 - 36.0 g/dL   RDW 13.8 11.5 - 15.5 %   Platelets 253 150 - 400 K/uL   Neutrophils Relative % 55 %   Neutro Abs 5.1 1.7 - 7.7 K/uL   Lymphocytes Relative 31 %   Lymphs Abs 2.9 0.7 - 4.0 K/uL   Monocytes Relative 8 %   Monocytes Absolute 0.7 0.1 - 1.0 K/uL   Eosinophils Relative 5 %    Eosinophils Absolute 0.5 0.0 - 0.7 K/uL   Basophils Relative 1 %   Basophils Absolute 0.1 0.0 - 0.1 K/uL  Basic metabolic panel     Status: Abnormal   Collection Time: 07/09/15  7:57 PM  Result Value Ref Range   Sodium 137 135 - 145 mmol/L   Potassium 4.2 3.5 - 5.1 mmol/L   Chloride 104 101 - 111 mmol/L   CO2 26 22 - 32 mmol/L   Glucose, Bld 115 (H) 65 - 99 mg/dL   BUN 25 (H) 6 - 20 mg/dL   Creatinine, Ser 0.80 0.44 - 1.00 mg/dL   Calcium 9.6 8.9 - 10.3 mg/dL   GFR calc non Af Amer >60 >60 mL/min   GFR calc Af Amer >60 >60 mL/min    Comment: (NOTE) The eGFR has been calculated using the CKD EPI equation. This calculation has not been validated in all clinical situations. eGFR's persistently <60 mL/min signify possible Chronic Kidney Disease.    Anion gap 7 5 - 15  Sedimentation rate     Status: Abnormal   Collection Time: 07/10/15  4:45 AM  Result Value Ref Range   Sed Rate 35 (H) 0 - 22 mm/hr   Mr Jeri Cos Wo Contrast  07/10/2015  CLINICAL DATA:  Initial valuation for acute visualized in right eye. History of retinal detachment, status post repair. EXAM: MRI HEAD WITHOUT AND WITH CONTRAST TECHNIQUE: Multiplanar, multiecho pulse sequences of the brain and surrounding structures were obtained without and with intravenous contrast. CONTRAST:  80m MULTIHANCE GADOBENATE DIMEGLUMINE 529 MG/ML IV SOLN COMPARISON:  None. FINDINGS: No abnormal foci of restricted diffusion to suggest acute intracranial infarct. Gray-white matter differentiation maintained. Normal intravascular flow voids are preserved. No acute or chronic intracranial hemorrhage. Cerebral volume within normal limits for patient age. Scattered T2/FLAIR hyperintensity within the periventricular, deep, and subcortical white matter present, nonspecific, but likely related to chronic small vessel ischemic disease. No mass lesion, midline shift or mass effect. No hydrocephalus. No extra-axial fluid collection. Craniocervical junction  within normal limits. Pituitary gland normal. Dedicated views of the orbits demonstrate evidence of prior scleral banding for retinal detachment at the right globe. The right globe itself is somewhat valve long and shape. The right lens has been replaced. No evidence for retinal detachment seen on this examination. Extraocular muscles are normal and symmetric bilaterally. Superior orbital veins symmetric and within normal limits. The optic nerves themselves are symmetric in size without evidence for abnormal enhancement to suggest optic neuritis. Optic nerve cc demonstrate symmetric fluid. Optic chiasm is normal. No abnormal enhancement elsewhere within the brain. IMPRESSION: 1. No evidence for acute optic neuritis or other acute optic nerve pathology. 2. Sequela of prior scleral banding at the right globe for presumed previous retinal detachment. No findings to suggest acute retinal detachment on this examination. 3. No other acute intracranial process. 4. Mild chronic small vessel ischemic disease. Electronically Signed   By: BJeannine BogaM.D.   On: 07/10/2015 01:36   Mr ODarnelle CatalanWo/w  Cm  07/10/2015  CLINICAL DATA:  Initial valuation for acute visualized in right eye. History of retinal detachment, status post repair. EXAM: MRI HEAD WITHOUT AND WITH CONTRAST TECHNIQUE: Multiplanar, multiecho pulse sequences of the brain and surrounding structures were obtained without and with intravenous contrast. CONTRAST:  20m MULTIHANCE GADOBENATE DIMEGLUMINE 529 MG/ML IV SOLN COMPARISON:  None. FINDINGS: No abnormal foci of restricted diffusion to suggest acute intracranial infarct. Gray-white matter differentiation maintained. Normal intravascular flow voids are preserved. No acute or chronic intracranial hemorrhage. Cerebral volume within normal limits for patient age. Scattered T2/FLAIR hyperintensity within the periventricular, deep, and subcortical white matter present, nonspecific, but likely related to  chronic small vessel ischemic disease. No mass lesion, midline shift or mass effect. No hydrocephalus. No extra-axial fluid collection. Craniocervical junction within normal limits. Pituitary gland normal. Dedicated views of the orbits demonstrate evidence of prior scleral banding for retinal detachment at the right globe. The right globe itself is somewhat valve long and shape. The right lens has been replaced. No evidence for retinal detachment seen on this examination. Extraocular muscles are normal and symmetric bilaterally. Superior orbital veins symmetric and within normal limits. The optic nerves themselves are symmetric in size without evidence for abnormal enhancement to suggest optic neuritis. Optic nerve cc demonstrate symmetric fluid. Optic chiasm is normal. No abnormal enhancement elsewhere within the brain. IMPRESSION: 1. No evidence for acute optic neuritis or other acute optic nerve pathology. 2. Sequela of prior scleral banding at the right globe for presumed previous retinal detachment. No findings to suggest acute retinal detachment on this examination. 3. No other acute intracranial process. 4. Mild chronic small vessel ischemic disease. Electronically Signed   By: BJeannine BogaM.D.   On: 07/10/2015 01:36                             Assessment and Plan: Vision loss OD in a patient with a history of RD.  I do not have any records for comparison to baseline.  My examination is limited at bedside.  No OCT access in hospital.  She does have an RAPD OD, but this could be longstanding.  (RAPD is common in eyes with a history of RD and multiple surgeries.)  With her RAPD, I think that optic neuropathy should be kept high on the differential diagnosis list.  She is being treated for presumed optic neuritis with IV solumedrol as per neurology.  However, the MRI was unremarkable.  Plus, she is older than the usual optic neuritis patient, and she has no pain with eye movement.  I will defer to  neurology on this, as it is their specialty.  Statistically,  Ischemic optic neuropathy is more likely.  Thus, I would recommend carotid dopplers and C-reactive protein.  I recommend watching cardiovascular risk factors.  I will discuss with hospitalist.    This patient has marked vitreous degeneration OD obscuring my view.  She needs a full ophthalmic evaluation (Visual field testing, OCT of optic nerve and macula, slit lamp exam, etc.) as an outpatient.  [facility], I would recommend she follow up with them.  She has transportation issues, but they do have a satellite office in [place]  I am happy to see this patient at my office for further testing.  I gave her my card.   All of the above information was relayed to the patient..   Follow up contact  information was provided.  All questions were answered.   [place] L 07/10/2015, 3:34 PM  [facility] [phone]

## 2015-07-11 ENCOUNTER — Encounter (HOSPITAL_COMMUNITY): Payer: PRIVATE HEALTH INSURANCE

## 2015-07-11 LAB — GLUCOSE, CAPILLARY
GLUCOSE-CAPILLARY: 145 mg/dL — AB (ref 65–99)
GLUCOSE-CAPILLARY: 189 mg/dL — AB (ref 65–99)
Glucose-Capillary: 129 mg/dL — ABNORMAL HIGH (ref 65–99)
Glucose-Capillary: 155 mg/dL — ABNORMAL HIGH (ref 65–99)

## 2015-07-11 LAB — BASIC METABOLIC PANEL
Anion gap: 11 (ref 5–15)
BUN: 23 mg/dL — AB (ref 6–20)
CHLORIDE: 102 mmol/L (ref 101–111)
CO2: 26 mmol/L (ref 22–32)
CREATININE: 0.69 mg/dL (ref 0.44–1.00)
Calcium: 9.9 mg/dL (ref 8.9–10.3)
GFR calc Af Amer: >60 mL/min (ref >60)
GFR calc non Af Amer: >60 mL/min (ref >60)
GLUCOSE: 174 mg/dL — AB (ref 65–99)
POTASSIUM: 3.8 mmol/L (ref 3.5–5.1)
SODIUM: 139 mmol/L (ref 135–145)

## 2015-07-11 LAB — ANGIOTENSIN CONVERTING ENZYME: ANGIOTENSIN-CONVERTING ENZYME: 52 U/L (ref 14–82)

## 2015-07-11 LAB — C-REACTIVE PROTEIN: CRP: <0.5 mg/dL (ref <1.0)

## 2015-07-11 MED ORDER — ONDANSETRON HCL 4 MG/2ML IJ SOLN
4.0000 mg | Freq: Four times a day (QID) | INTRAMUSCULAR | Status: DC | PRN
Start: 2015-07-11 — End: 2015-07-13
  Administered 2015-07-11 – 2015-07-12 (×2): 4 mg via INTRAVENOUS
  Filled 2015-07-11 (×2): qty 2

## 2015-07-11 MED ORDER — HYDRALAZINE HCL 20 MG/ML IJ SOLN
5.0000 mg | Freq: Four times a day (QID) | INTRAMUSCULAR | Status: DC | PRN
Start: 2015-07-11 — End: 2015-07-13

## 2015-07-11 MED ORDER — HYDROCODONE-ACETAMINOPHEN 5-325 MG PO TABS
1.0000 | ORAL_TABLET | Freq: Four times a day (QID) | ORAL | Status: DC | PRN
  Administered 2015-07-11 – 2015-07-12 (×2): 1 via ORAL
  Filled 2015-07-11 (×2): qty 1

## 2015-07-11 MED ORDER — TRAMADOL HCL 50 MG PO TABS
50.0000 mg | ORAL_TABLET | Freq: Once | ORAL | Status: AC
  Administered 2015-07-11: 50 mg via ORAL
  Filled 2015-07-11: qty 1

## 2015-07-11 MED ORDER — MORPHINE SULFATE (PF) 2 MG/ML IV SOLN
1.0000 mg | Freq: Once | INTRAVENOUS | Status: AC
  Administered 2015-07-11: 1 mg via INTRAVENOUS
  Filled 2015-07-11: qty 1

## 2015-07-11 MED ORDER — BUTALBITAL-APAP-CAFFEINE 50-325-40 MG PO TABS: 1.0000 | ORAL_TABLET | ORAL | Status: DC | PRN

## 2015-07-11 NOTE — Progress Notes (Signed)
Subjective: Patient was complaining of headache and questionable reaction to treatment with Solu-Medrol last night with a burning sensation involving left side of her face and headache. She was given Tylenol, Ultram, butalbital as well as morphine and headache was not relieved. She has not experienced an improvement in vision involving her right eye.  Objective: Current vital signs: BP 154/94 mmHg  Pulse 81  Temp(Src) 97.8 F (36.6 C) (Oral)  Resp 17  Ht 5\' 6"  (1.676 m)  Wt 87.6 kg (193 lb 2 oz)  BMI 31.19 kg/m2  SpO2 96%  Neurologic Exam: Patient was complaining of headache and appeared to be at least in moderate discomfort. Mental status was normal. Visual acuity right eye was 20/400. Extraocular movements were full and conjugate. No facial weakness was noted. Speech was normal.  Medications: I have reviewed the patient's current medications.  Assessment/Plan: 59 year old lady with probable right optic neuritis. Etiology is unclear. Patient's also complaining of headache which so far has been unresponsive to analgesic medications given.  Plan: 1. Continue course of IV Solu-Medrol as planned 2. Trial of Vicodin 5-3 25 one every 6 hours when necessary severe headache  We will continue to follow this patient with you.  C.R. Nicole Kindred, MD [facility] [phone]  07/11/2015  10:10 AM

## 2015-07-11 NOTE — Progress Notes (Addendum)
Patient Demographics  [name], is a 59 y.o. female, DOB - 01/20/1956, PQZ:[id number]  Admit date - 07/09/2015   Admitting Physician Edwin Dada, MD  Outpatient Primary MD for the patient is Suzanna Obey, MD  LOS - 1   Chief Complaint  Patient presents with  . Loss of Vision         Subjective:   [name] today has,  No chest pain, No abdominal pain -  No new weakness tingling or numbness, No Cough - SOB. Complaining off headache, nausea, had an episode of vomiting.  Assessment & Plan    Principal Problem:   Optic neuritis Active Problems:   Hypertension   Arthritis  Right eye blurry vision - Etiology is unclear, has been seen by neurology and ophthalmology, MRI brain and orbits  is unremarkable, will continue with Solu-Medrol treatment as planned per neurology, will obtain carotid Dopplers as recommended by ophthalmology to rule out ischemic event contributing to her symptoms. - Will need to follow-up with [facility] ophthalmology on discharge for more appropriate eye exam in the outpatient setting. - Continue with PPI and CBGs monitoring as patient is on high-dose steroids. - Lyme Disease Ab pending as she endorses tick bites, follow on ANA, ACE level, ESR is mildly elevated  Hypertension - Continue with home medication, will add when necessary hydralazine.   Code Status: Full  Family Communication: None at bedside  Disposition Plan: Home when stable   Procedures  None   Consults   Ophthalmology Dr Luberta Mutter Neurology Dr. Doy Mince   Medications  Scheduled Meds: . carvedilol  12.5 mg Oral BID WC  . enoxaparin (LOVENOX) injection  40 mg Subcutaneous Q24H  . methylPREDNISolone (SOLU-MEDROL) injection  250 mg Intravenous Q6H  . pantoprazole  40 mg Oral BID   Continuous Infusions:  PRN Meds:.HYDROcodone-acetaminophen, ondansetron (ZOFRAN)  IV  DVT Prophylaxis  Lovenox -   Lab Results  Component Value Date   PLT 253 07/09/2015    Antibiotics    Anti-infectives    None          Objective:   Filed Vitals:   07/10/15 0607 07/10/15 1347 07/10/15 2242 07/11/15 0551  BP: 148/69 139/72 142/84 154/94  Pulse: 71 85 83 81  Temp: 98.1 F (36.7 C) 98.1 F (36.7 C) 98.3 F (36.8 C) 97.8 F (36.6 C)  TempSrc: Oral Oral Oral Oral  Resp: _0 Height: _1  (1.676 m)     Weight: 87.6 kg (193 lb 2 oz)     SpO2: 100% 97% 98% 96%    Wt Readings from Last 3 Encounters:  07/10/15 87.6 kg (193 lb 2 oz)     Intake/Output Summary (Last 24 hours) at 07/11/15 1251 Last data filed at 07/11/15 0930  Gross per 24 hour  Intake    360 ml  Output      0 ml  Net    360 ml     Physical Exam  Awake Alert, Oriented X 3, Significantly decreased visual activity in right eye, extraocular movement intact. Supple Neck,No JVD,  Symmetrical Chest wall movement, Good air movement bilaterally, CTAB RRR,No Gallops,Rubs or new Murmurs, No Parasternal Heave +ve B.Sounds, Abd Soft, No tenderness, No organomegaly appriciated, No  rebound - guarding or rigidity. No Cyanosis, Clubbing or edema, No new Rash or bruise     Data Review   Micro Results No results found for this or any previous visit (from the past 240 hour(s)).  Radiology Reports Mr Jeri Cos Wo Contrast  07/10/2015  CLINICAL DATA:  Initial valuation for acute visualized in right eye. History of retinal detachment, status post repair. EXAM: MRI HEAD WITHOUT AND WITH CONTRAST TECHNIQUE: Multiplanar, multiecho pulse sequences of the brain and surrounding structures were obtained without and with intravenous contrast. CONTRAST:  7m MULTIHANCE GADOBENATE DIMEGLUMINE 529 MG/ML IV SOLN COMPARISON:  None. FINDINGS: No abnormal foci of restricted diffusion to suggest acute intracranial infarct. Gray-white matter differentiation maintained. Normal intravascular flow voids are  preserved. No acute or chronic intracranial hemorrhage. Cerebral volume within normal limits for patient age. Scattered T2/FLAIR hyperintensity within the periventricular, deep, and subcortical white matter present, nonspecific, but likely related to chronic small vessel ischemic disease. No mass lesion, midline shift or mass effect. No hydrocephalus. No extra-axial fluid collection. Craniocervical junction within normal limits. Pituitary gland normal. Dedicated views of the orbits demonstrate evidence of prior scleral banding for retinal detachment at the right globe. The right globe itself is somewhat valve long and shape. The right lens has been replaced. No evidence for retinal detachment seen on this examination. Extraocular muscles are normal and symmetric bilaterally. Superior orbital veins symmetric and within normal limits. The optic nerves themselves are symmetric in size without evidence for abnormal enhancement to suggest optic neuritis. Optic nerve cc demonstrate symmetric fluid. Optic chiasm is normal. No abnormal enhancement elsewhere within the brain. IMPRESSION: 1. No evidence for acute optic neuritis or other acute optic nerve pathology. 2. Sequela of prior scleral banding at the right globe for presumed previous retinal detachment. No findings to suggest acute retinal detachment on this examination. 3. No other acute intracranial process. 4. Mild chronic small vessel ischemic disease. Electronically Signed   By: BJeannine BogaM.D.   On: 07/10/2015 01:36   Mm Digital Screening Bilateral  06/29/2015  CLINICAL DATA:  Screening. EXAM: DIGITAL SCREENING BILATERAL MAMMOGRAM WITH CAD COMPARISON:  Previous exam(s). ACR Breast Density Category c: The breast tissue is heterogeneously dense, which may obscure small masses. FINDINGS: There are no findings suspicious for malignancy. Images were processed with CAD. IMPRESSION: No mammographic evidence of malignancy. A result letter of this screening  mammogram will be mailed directly to the patient. RECOMMENDATION: Screening mammogram in one year. (Code:SM-B-01Y) BI-RADS CATEGORY  1: Negative. Electronically Signed   By: DFidela SalisburyM.D.   On: 06/29/2015 11:21   Mr ODarnelle CatalanWo/w Cm  07/10/2015  CLINICAL DATA:  Initial valuation for acute visualized in right eye. History of retinal detachment, status post repair. EXAM: MRI HEAD WITHOUT AND WITH CONTRAST TECHNIQUE: Multiplanar, multiecho pulse sequences of the brain and surrounding structures were obtained without and with intravenous contrast. CONTRAST:  178mMULTIHANCE GADOBENATE DIMEGLUMINE 529 MG/ML IV SOLN COMPARISON:  None. FINDINGS: No abnormal foci of restricted diffusion to suggest acute intracranial infarct. Gray-white matter differentiation maintained. Normal intravascular flow voids are preserved. No acute or chronic intracranial hemorrhage. Cerebral volume within normal limits for patient age. Scattered T2/FLAIR hyperintensity within the periventricular, deep, and subcortical white matter present, nonspecific, but likely related to chronic small vessel ischemic disease. No mass lesion, midline shift or mass effect. No hydrocephalus. No extra-axial fluid collection. Craniocervical junction within normal limits. Pituitary gland normal. Dedicated views of the orbits demonstrate evidence of prior scleral  banding for retinal detachment at the right globe. The right globe itself is somewhat valve long and shape. The right lens has been replaced. No evidence for retinal detachment seen on this examination. Extraocular muscles are normal and symmetric bilaterally. Superior orbital veins symmetric and within normal limits. The optic nerves themselves are symmetric in size without evidence for abnormal enhancement to suggest optic neuritis. Optic nerve cc demonstrate symmetric fluid. Optic chiasm is normal. No abnormal enhancement elsewhere within the brain. IMPRESSION: 1. No evidence for acute optic  neuritis or other acute optic nerve pathology. 2. Sequela of prior scleral banding at the right globe for presumed previous retinal detachment. No findings to suggest acute retinal detachment on this examination. 3. No other acute intracranial process. 4. Mild chronic small vessel ischemic disease. Electronically Signed   By: Jeannine Boga M.D.   On: 07/10/2015 01:36     CBC  Recent Labs Lab 07/09/15 1957  WBC 9.2  HGB 13.5  HCT 40.7  PLT 253  MCV 95.3  MCH 31.6  MCHC 33.2  RDW 13.8  LYMPHSABS 2.9  MONOABS 0.7  EOSABS 0.5  BASOSABS 0.1    Chemistries   Recent Labs Lab 07/09/15 1957 07/11/15 0537  NA 137 139  K 4.2 3.8  CL 104 102  CO2 26 26  GLUCOSE 115* 174*  BUN 25* 23*  CREATININE 0.80 0.69  CALCIUM 9.6 9.9   ------------------------------------------------------------------------------------------------------------------ estimated creatinine clearance is 82.3 mL/min (by C-G formula based on Cr of 0.69). ------------------------------------------------------------------------------------------------------------------ No results for input(s): HGBA1C in the last 72 hours. ------------------------------------------------------------------------------------------------------------------ No results for input(s): CHOL, HDL, LDLCALC, TRIG, CHOLHDL, LDLDIRECT in the last 72 hours. ------------------------------------------------------------------------------------------------------------------ No results for input(s): TSH, T4TOTAL, T3FREE, THYROIDAB in the last 72 hours.  Invalid input(s): FREET3 ------------------------------------------------------------------------------------------------------------------ No results for input(s): VITAMINB12, FOLATE, FERRITIN, TIBC, IRON, RETICCTPCT in the last 72 hours.  Coagulation profile No results for input(s): INR, PROTIME in the last 168 hours.  No results for input(s): DDIMER in the last 72 hours.  Cardiac  Enzymes No results for input(s): CKMB, TROPONINI, MYOGLOBIN in the last 168 hours.  Invalid input(s): CK ------------------------------------------------------------------------------------------------------------------ Invalid input(s): POCBNP     Time Spent in minutes   30 minutes   ELGERGAWY, DAWOOD M.D on 07/11/2015 at 12:51 PM  Between 7am to 7pm - Pager - [phone]  After 7pm go to [email or web address] - password [facility]  [facility]   Office  [phone]

## 2015-07-11 NOTE — Progress Notes (Signed)
Dr Doy Mince informed of patient c/o headache and facial pain post solumedrol infusion . Orders given to hold 0500 iv solumedrol dose

## 2015-07-11 NOTE — Progress Notes (Signed)
Patient c/o of headache and facial pain. Hsp paged(Dr. Rogue Bussing) @ x2 orders given to administer  tylenol on call one. morphine on call #2/ see mar for dosing times.

## 2015-07-12 ENCOUNTER — Encounter (HOSPITAL_COMMUNITY): Payer: Self-pay | Admitting: *Deleted

## 2015-07-12 ENCOUNTER — Inpatient Hospital Stay (HOSPITAL_COMMUNITY): Payer: Medicare Other

## 2015-07-12 DIAGNOSIS — H538 Other visual disturbances: Secondary | ICD-10-CM

## 2015-07-12 LAB — GLUCOSE, CAPILLARY
GLUCOSE-CAPILLARY: 171 mg/dL — AB (ref 65–99)
GLUCOSE-CAPILLARY: 209 mg/dL — AB (ref 65–99)
Glucose-Capillary: 136 mg/dL — ABNORMAL HIGH (ref 65–99)
Glucose-Capillary: 145 mg/dL — ABNORMAL HIGH (ref 65–99)

## 2015-07-12 LAB — B. BURGDORFI ANTIBODIES: B burgdorferi Ab IgG+IgM: <0.91 {ISR} (ref 0.00–0.90)

## 2015-07-12 LAB — ANTINUCLEAR ANTIBODIES, IFA: ANTINUCLEAR ANTIBODIES, IFA: NEGATIVE

## 2015-07-12 MED ORDER — PROMETHAZINE HCL 25 MG PO TABS
12.5000 mg | ORAL_TABLET | Freq: Four times a day (QID) | ORAL | Status: DC | PRN
  Administered 2015-07-12: 12.5 mg via ORAL
  Filled 2015-07-12: qty 1

## 2015-07-12 MED ORDER — SODIUM CHLORIDE 0.9 % IV SOLN
250.0000 mg | Freq: Four times a day (QID) | INTRAVENOUS | Status: AC
  Administered 2015-07-12 – 2015-07-13 (×3): 250 mg via INTRAVENOUS
  Filled 2015-07-12 (×5): qty 2

## 2015-07-12 NOTE — Progress Notes (Signed)
Subjective: patient feels slight improvement especially when looking at a distance. She did have a HA this AM but the Vicodin seems to keep it mild.   Objective: Current vital signs: BP 143/69 mmHg  Pulse 70  Temp(Src) 97.6 F (36.4 C) (Oral)  Resp 16  Ht 5\' 6"  (1.676 m)  Wt 87.6 kg (193 lb 2 oz)  BMI 31.19 kg/m2  SpO2 98% Vital signs in last 24 hours: Temp:  [97.6 F (36.4 C)-97.9 F (36.6 C)] 97.6 F (36.4 C) (10/17 0540) Pulse Rate:  [70-79] 70 (10/17 0540) Resp:  [16-19] 16 (10/17 0540) BP: (143-145)/(68-74) 143/69 mmHg (10/17 0540) SpO2:  [95 %-100 %] 98 % (10/17 0540)  Intake/Output from previous day: 10/16 0701 - 10/17 0700 In: 490 [P.O.:440; IV Piggyback:50] Out: 500 [Urine:500] Intake/Output this shift:   Nutritional status: Diet regular Room service appropriate?: Yes; Fluid consistency:: Thin  Neurologic Exam: General: NAD Mental Status: Alert, oriented, thought content appropriate.  Speech fluent without evidence of aphasia.  Able to follow 3 step commands without difficulty. Cranial Nerves: II: Visual fields intact right eye 20/200 and left eye 20/20, pupils equal, round, reactive to light and accommodation III,IV, VI: ptosis not present, extra-ocular motions intact bilaterally V,VII: smile symmetric, facial light touch sensation normal bilaterally VIII: hearing normal bilaterally IX,X: uvula rises symmetrically XI: bilateral shoulder shrug XII: midline tongue extension without atrophy or fasciculations  Motor: Right : Upper extremity   5/5    Left:     Upper extremity   5/5  Lower extremity   5/5     Lower extremity   5/5 Tone and bulk:normal tone throughout; no atrophy noted Sensory: Pinprick and light touch intact throughout, bilaterally Deep Tendon Reflexes:  Right: Upper Extremity   Left: Upper extremity   biceps (C-5 to C-6) 2/4   biceps (C-5 to C-6) 2/4 tricep (C7) 2/4    triceps (C7) 2/4 Brachioradialis (C6) 2/4  Brachioradialis (C6)  2/4  Lower Extremity Lower Extremity  quadriceps (L-2 to L-4) 2/4   quadriceps (L-2 to L-4) 2/4 Achilles (S1) 0/4   Achilles (S1) 0/4  Plantars: Right: downgoing   Left: downgoing t    Lab Results: Basic Metabolic Panel:  Recent Labs Lab 07/09/15 1957 07/11/15 0537  NA 137 139  K 4.2 3.8  CL 104 102  CO2 26 26  GLUCOSE 115* 174*  BUN 25* 23*  CREATININE 0.80 0.69  CALCIUM 9.6 9.9    Liver Function Tests: No results for input(s): AST, ALT, ALKPHOS, BILITOT, PROT, ALBUMIN in the last 168 hours. No results for input(s): LIPASE, AMYLASE in the last 168 hours. No results for input(s): AMMONIA in the last 168 hours.  CBC:  Recent Labs Lab 07/09/15 1957  WBC 9.2  NEUTROABS 5.1  HGB 13.5  HCT 40.7  MCV 95.3  PLT 253    Cardiac Enzymes: No results for input(s): CKTOTAL, CKMB, CKMBINDEX, TROPONINI in the last 168 hours.  Lipid Panel: No results for input(s): CHOL, TRIG, HDL, CHOLHDL, VLDL, LDLCALC in the last 168 hours.  CBG:  Recent Labs Lab 07/11/15 0748 07/11/15 1151 07/11/15 1650 07/11/15 2130 07/12/15 0808  GLUCAP 145* 129* 189* 155* 136*    Microbiology: No results found for this or any previous visit.  Coagulation Studies: No results for input(s): LABPROT, INR in the last 72 hours.  Imaging: No results found.  Medications:  Scheduled: . carvedilol  12.5 mg Oral BID WC  . enoxaparin (LOVENOX) injection  40 mg Subcutaneous Q24H  .  methylPREDNISolone (SOLU-MEDROL) injection  250 mg Intravenous Q6H  . pantoprazole  40 mg Oral BID    Assessment/Plan: 59 year old lady with probable right optic neuritis. Etiology is unclear.now 7/12 doses of solumedrol with some improvement. Sed rate 35, ACE (-). ANA and Lyme pending  Plan: 1) Continue Solumedrol  2) Continue Vicodin 5-325 Q6 hours PRN for HA.   Etta Quill PA-C [facility] [phone]  07/12/2015, 9:20 AM   I personally participated in this patient's evaluation and  management, including formulating the above clinical assessment and management recommendations.  Rush Farmer M.D. [facility] [phone]

## 2015-07-12 NOTE — Progress Notes (Deleted)
Pt has orders to receive IV Solumedrol. Med not available. Pharmacy was notified. Awaiting medication.

## 2015-07-12 NOTE — Progress Notes (Addendum)
Patient Demographics  [name], is a 59 y.o. female, DOB - 04/27/1956, AJG:[id number]  Admit date - 07/09/2015   Admitting Physician Edwin Dada, MD  Outpatient Primary MD for the patient is Suzanna Obey, MD  LOS - 2   Chief Complaint  Patient presents with  . Loss of Vision         Subjective:   [name] today has,  No chest pain, No abdominal pain -  No new weakness tingling or numbness, No Cough - SOB. Complaints of headache, mild nausea, reports blurry vision is improving.  Assessment & Plan    Principal Problem:   Optic neuritis Active Problems:   Hypertension   Arthritis  Right eye blurry vision - Etiology is unclear, has been seen by neurology and ophthalmology, MRI brain and orbits  is unremarkable, will continue with Solu-Medrol treatment as planned per neurology,   - carotid Dopplers :Bilateral: 1-39% ICA stenosis, Right ECA >50% stenosis.  - Will need to follow-up with [facility] ophthalmology on discharge for more appropriate eye exam in the outpatient setting. Ointment has been scheduled with Dr. Dwana Melena on 10/24 at [place] office at 8:40 am. - This discussed with her optometrist Dr.Oman, who examined her in our office prior to admission, she did not see any evidence of ischemic picture on her physical exam. - Continue with PPI and CBGs monitoring as patient is on high-dose steroids. - Lyme Disease Ab were obtained as she endorses tick bites, they are negative,  - ANA, ACE level, CRP within normal limits ESR is mildly elevated  Hypertension - Continue with home medication, will add when necessary hydralazine.  Headache - Proven with Vicodin, will check CT head.  Code Status: Full  Family Communication: None at bedside  Disposition Plan: Home when stable   Procedures  None   Consults   Ophthalmology Dr Luberta Mutter Neurology Dr.  Doy Mince   Medications  Scheduled Meds: . carvedilol  12.5 mg Oral BID WC  . enoxaparin (LOVENOX) injection  40 mg Subcutaneous Q24H  . methylPREDNISolone (SOLU-MEDROL) injection  250 mg Intravenous Q6H  . pantoprazole  40 mg Oral BID   Continuous Infusions:  PRN Meds:.hydrALAZINE, HYDROcodone-acetaminophen, ondansetron (ZOFRAN) IV, promethazine  DVT Prophylaxis  Lovenox -   Lab Results  Component Value Date   PLT 253 07/09/2015    Antibiotics    Anti-infectives    None          Objective:   Filed Vitals:   07/11/15 1341 07/11/15 2132 07/12/15 0540 07/12/15 1422  BP: 143/68 145/74 143/69 155/85  Pulse: 77 79 70 77  Temp: 97.9 F (36.6 C) 97.6 F (36.4 C) 97.6 F (36.4 C) 98.4 F (36.9 C)  TempSrc: Oral Oral Oral Oral  Resp: _0 Height:      Weight:      SpO2: 100% 95% 98% 99%    Wt Readings from Last 3 Encounters:  07/10/15 87.6 kg (193 lb 2 oz)     Intake/Output Summary (Last 24 hours) at 07/12/15 1438 Last data filed at 07/12/15 1031  Gross per 24 hour  Intake    440 ml  Output    300 ml  Net    140 ml  Physical Exam  Awake Alert, Oriented X 3, Significantly decreased visual activity in right eye, extraocular movement intact. Supple Neck,No JVD,  Symmetrical Chest wall movement, Good air movement bilaterally, CTAB RRR,No Gallops,Rubs or new Murmurs, No Parasternal Heave +ve B.Sounds, Abd Soft, No tenderness, No organomegaly appriciated, No rebound - guarding or rigidity. No Cyanosis, Clubbing or edema, No new Rash or bruise     Data Review   Micro Results No results found for this or any previous visit (from the past 240 hour(s)).  Radiology Reports Mr Jeri Cos Wo Contrast  07/10/2015  CLINICAL DATA:  Initial valuation for acute visualized in right eye. History of retinal detachment, status post repair. EXAM: MRI HEAD WITHOUT AND WITH CONTRAST TECHNIQUE: Multiplanar, multiecho pulse sequences of the brain and surrounding  structures were obtained without and with intravenous contrast. CONTRAST:  82m MULTIHANCE GADOBENATE DIMEGLUMINE 529 MG/ML IV SOLN COMPARISON:  None. FINDINGS: No abnormal foci of restricted diffusion to suggest acute intracranial infarct. Gray-white matter differentiation maintained. Normal intravascular flow voids are preserved. No acute or chronic intracranial hemorrhage. Cerebral volume within normal limits for patient age. Scattered T2/FLAIR hyperintensity within the periventricular, deep, and subcortical white matter present, nonspecific, but likely related to chronic small vessel ischemic disease. No mass lesion, midline shift or mass effect. No hydrocephalus. No extra-axial fluid collection. Craniocervical junction within normal limits. Pituitary gland normal. Dedicated views of the orbits demonstrate evidence of prior scleral banding for retinal detachment at the right globe. The right globe itself is somewhat valve long and shape. The right lens has been replaced. No evidence for retinal detachment seen on this examination. Extraocular muscles are normal and symmetric bilaterally. Superior orbital veins symmetric and within normal limits. The optic nerves themselves are symmetric in size without evidence for abnormal enhancement to suggest optic neuritis. Optic nerve cc demonstrate symmetric fluid. Optic chiasm is normal. No abnormal enhancement elsewhere within the brain. IMPRESSION: 1. No evidence for acute optic neuritis or other acute optic nerve pathology. 2. Sequela of prior scleral banding at the right globe for presumed previous retinal detachment. No findings to suggest acute retinal detachment on this examination. 3. No other acute intracranial process. 4. Mild chronic small vessel ischemic disease. Electronically Signed   By: BJeannine BogaM.D.   On: 07/10/2015 01:36   Mm Digital Screening Bilateral  06/29/2015  CLINICAL DATA:  Screening. EXAM: DIGITAL SCREENING BILATERAL MAMMOGRAM  WITH CAD COMPARISON:  Previous exam(s). ACR Breast Density Category c: The breast tissue is heterogeneously dense, which may obscure small masses. FINDINGS: There are no findings suspicious for malignancy. Images were processed with CAD. IMPRESSION: No mammographic evidence of malignancy. A result letter of this screening mammogram will be mailed directly to the patient. RECOMMENDATION: Screening mammogram in one year. (Code:SM-B-01Y) BI-RADS CATEGORY  1: Negative. Electronically Signed   By: DFidela SalisburyM.D.   On: 06/29/2015 11:21   Mr ODarnelle CatalanWo/w Cm  07/10/2015  CLINICAL DATA:  Initial valuation for acute visualized in right eye. History of retinal detachment, status post repair. EXAM: MRI HEAD WITHOUT AND WITH CONTRAST TECHNIQUE: Multiplanar, multiecho pulse sequences of the brain and surrounding structures were obtained without and with intravenous contrast. CONTRAST:  146mMULTIHANCE GADOBENATE DIMEGLUMINE 529 MG/ML IV SOLN COMPARISON:  None. FINDINGS: No abnormal foci of restricted diffusion to suggest acute intracranial infarct. Gray-white matter differentiation maintained. Normal intravascular flow voids are preserved. No acute or chronic intracranial hemorrhage. Cerebral volume within normal limits for patient age. Scattered T2/FLAIR hyperintensity within the  periventricular, deep, and subcortical white matter present, nonspecific, but likely related to chronic small vessel ischemic disease. No mass lesion, midline shift or mass effect. No hydrocephalus. No extra-axial fluid collection. Craniocervical junction within normal limits. Pituitary gland normal. Dedicated views of the orbits demonstrate evidence of prior scleral banding for retinal detachment at the right globe. The right globe itself is somewhat valve long and shape. The right lens has been replaced. No evidence for retinal detachment seen on this examination. Extraocular muscles are normal and symmetric bilaterally. Superior orbital  veins symmetric and within normal limits. The optic nerves themselves are symmetric in size without evidence for abnormal enhancement to suggest optic neuritis. Optic nerve cc demonstrate symmetric fluid. Optic chiasm is normal. No abnormal enhancement elsewhere within the brain. IMPRESSION: 1. No evidence for acute optic neuritis or other acute optic nerve pathology. 2. Sequela of prior scleral banding at the right globe for presumed previous retinal detachment. No findings to suggest acute retinal detachment on this examination. 3. No other acute intracranial process. 4. Mild chronic small vessel ischemic disease. Electronically Signed   By: Jeannine Boga M.D.   On: 07/10/2015 01:36     CBC  Recent Labs Lab 07/09/15 1957  WBC 9.2  HGB 13.5  HCT 40.7  PLT 253  MCV 95.3  MCH 31.6  MCHC 33.2  RDW 13.8  LYMPHSABS 2.9  MONOABS 0.7  EOSABS 0.5  BASOSABS 0.1    Chemistries   Recent Labs Lab 07/09/15 1957 07/11/15 0537  NA 137 139  K 4.2 3.8  CL 104 102  CO2 26 26  GLUCOSE 115* 174*  BUN 25* 23*  CREATININE 0.80 0.69  CALCIUM 9.6 9.9   ------------------------------------------------------------------------------------------------------------------ estimated creatinine clearance is 82.3 mL/min (by C-G formula based on Cr of 0.69). ------------------------------------------------------------------------------------------------------------------ No results for input(s): HGBA1C in the last 72 hours. ------------------------------------------------------------------------------------------------------------------ No results for input(s): CHOL, HDL, LDLCALC, TRIG, CHOLHDL, LDLDIRECT in the last 72 hours. ------------------------------------------------------------------------------------------------------------------ No results for input(s): TSH, T4TOTAL, T3FREE, THYROIDAB in the last 72 hours.  Invalid input(s):  FREET3 ------------------------------------------------------------------------------------------------------------------ No results for input(s): VITAMINB12, FOLATE, FERRITIN, TIBC, IRON, RETICCTPCT in the last 72 hours.  Coagulation profile No results for input(s): INR, PROTIME in the last 168 hours.  No results for input(s): DDIMER in the last 72 hours.  Cardiac Enzymes No results for input(s): CKMB, TROPONINI, MYOGLOBIN in the last 168 hours.  Invalid input(s): CK ------------------------------------------------------------------------------------------------------------------ Invalid input(s): POCBNP     Time Spent in minutes   30 minutes   ELGERGAWY, DAWOOD M.D on 07/12/2015 at 2:38 PM  Between 7am to 7pm - Pager - [phone]  After 7pm go to [email or web address] - password [email or web address]  [facility]   Office  [phone]

## 2015-07-12 NOTE — Progress Notes (Deleted)
[name] to be D/C'd Home per MD order.  Discussed with the patient and all questions fully answered.  VSS, Skin clean, dry and intact without evidence of skin break down, no evidence of skin tears noted. IV catheter discontinued intact. Site without signs and symptoms of complications. Dressing and pressure applied.  An After Visit Summary was printed and given to the patient. Patient received prescription.  D/c education completed with patient/family including follow up instructions, medication list, d/c activities limitations if indicated, with other d/c instructions as indicated by MD - patient able to verbalize understanding, all questions fully answered.   Patient instructed to return to ED, call 911, or call MD for any changes in condition.   Patient escorted via [place], and D/C home via private auto.  [name] 07/12/2015 5:34 PM

## 2015-07-12 NOTE — Progress Notes (Signed)
Utilization review completed. Martavion Couper, RN, BSN. 

## 2015-07-12 NOTE — Progress Notes (Signed)
*  PRELIMINARY RESULTS* Vascular Ultrasound Carotid Duplex (Doppler) has been completed.  Preliminary findings: Bilateral:  1-39% ICA stenosis.  Right ECA >50% stenosis.  Vertebral artery flow is antegrade.     Landry Mellow, RDMS, RVT  07/12/2015, 1:04 PM

## 2015-07-13 LAB — GLUCOSE, CAPILLARY
GLUCOSE-CAPILLARY: 124 mg/dL — AB (ref 65–99)
GLUCOSE-CAPILLARY: 145 mg/dL — AB (ref 65–99)

## 2015-07-13 MED ORDER — SODIUM CHLORIDE 0.9 % IV SOLN
250.0000 mg | INTRAVENOUS | Status: AC
  Administered 2015-07-13: 250 mg via INTRAVENOUS
  Filled 2015-07-13: qty 2

## 2015-07-13 NOTE — Discharge Summary (Signed)
[name], is a 59 y.o. female  DOB May 23, 1956  MRN [id number].  Admission date:  07/09/2015  Admitting Physician  Edwin Dada, MD  Discharge Date:  07/13/2015   Primary MD  Suzanna Obey, MD  Recommendations for primary care physician for things to follow:  - check CBC, BMP the next visit. - Patient will need to follow-up with ophthalmology as scheduled appointment 10/24 - Patient  to follow with neurology in 2 weeks .   Admission Diagnosis  Vision loss [H54.7] Optic neuritis [H46.9]   Discharge Diagnosis  Vision loss [H54.7] Optic neuritis [H46.9]    Principal Problem:   Optic neuritis Active Problems:   Hypertension   Arthritis      Past Medical History  Diagnosis Date  . Retinal detachment   . Hypertension   . Arthritis     History reviewed. No pertinent past surgical history.     History of present illness and  Hospital Course:     Kindly see H&P for history of present illness and admission details, please review complete Labs, Consult reports and Test reports for all details in brief  HPI  from the history and physical done on the day of admission  [name] is a 59 y.o. female with a past medical history significant for HTN and retinal detachment who presents with vision changes of right eye.  The patient was in her normal state of health until today around noon when she abruptly onset of visual defects in her right eye. This was painless, look like "gray patches" in front of things, and seem to be evolving. It was not associated with headache, fever, jaw pain, joint pain, slurred speech, focal weakness. She went to an ophthalmologist as records are not available to [place] who recommended that she present to the ER for urgent MRI.  In the ED, this MRI showed no stroke, white matter lesions, or evidence of optic neuritis. Evaluated by neurology who felt that her  clinical exam was consistent with optic neuritis and warranted admission and empiric treatment with IV steroids.    Hospital Course   Right eye blurry vision - Etiology is unclear, has been seen by neurology and ophthalmology, MRI brain and orbits is unremarkable, there was clinical suspicion for optic neuritis, patient was treated with IV Solu-Medrol, finish total of 12 doses of 250 mg of IV Solu-Medrol over  3 days, follow with urology as an outpatient for further recommendation, referral has been sent to [facility] neurology. - carotid Dopplers :Bilateral: 1-39% ICA stenosis, Right ECA >50% stenosis.  - Will need to follow-up with [facility] ophthalmology on discharge for more appropriate eye exam in the outpatient setting. Ointment has been scheduled with Dr. Dwana Melena on 10/24 at [place] office at 8:40 am. - This discussed with her optometrist Dr.Oman, who examined her in our office prior to admission, she did not see any evidence of ischemic picture on her physical exam. - Lyme Disease Ab were obtained as she endorses tick bites, they  are negative,  - ANA, ACE level, CRP within normal limits ESR is mildly elevated  Hypertension - Continue with home medication,   Headache - Negative CT head      Discharge Condition:    Follow UP  Follow-up Information    Follow up with Dr Dwana Melena .   Why:  apointment on 10/24 at 8:40 am at       Follow up with Suzanna Obey, MD. Call in 1 week.   Specialty:  Family Medicine   Why:  follow up   Contact information:   [place] [address] [phone]       Follow up with [facility]. Call in 1 week.   Specialty:  Neurology   Why:  For optic neuritisoptic neuritis   Contact information:   [address] [place] [phone]        Discharge Instructions  and  Discharge Medications     Discharge Instructions    Ambulatory  referral to Neurology    Complete by:  As directed   An appointment is requested in approximately: 2 weeks     Discharge instructions    Complete by:  As directed   Follow with Primary MD Suzanna Obey, MD in 7 days   Get CBC, CMP, 2 view Chest X ray checked  by Primary MD next visit.    Activity: As tolerated with Full fall precautions use walker/cane & assistance as needed   Disposition Home **   Diet: Heart Healthy ** , with feeding assistance and aspiration precautions.  For Heart failure patients - Check your Weight same time everyday, if you gain over 2 pounds, or you develop in leg swelling, experience more shortness of breath or chest pain, call your Primary MD immediately. Follow Cardiac Low Salt Diet and 1.5 lit/day fluid restriction.   On your next visit with your primary care physician please Get Medicines reviewed and adjusted.   Please request your Prim.MD to go over all Hospital Tests and Procedure/Radiological results at the follow up, please get all Hospital records sent to your Prim MD by signing hospital release before you go home.   If you experience worsening of your admission symptoms, develop shortness of breath, life threatening emergency, suicidal or homicidal thoughts you must seek medical attention immediately by calling 911 or calling your MD immediately  if symptoms less severe.  You Must read complete instructions/literature along with all the possible adverse reactions/side effects for all the Medicines you take and that have been prescribed to you. Take any new Medicines after you have completely understood and accpet all the possible adverse reactions/side effects.   Do not drive, operating heavy machinery, perform activities at heights, swimming or participation in water activities or provide baby sitting services if your were admitted for syncope or siezures until you have seen by Primary MD or a Neurologist and advised to do so again.  Do not drive  when taking Pain medications.    Do not take more than prescribed Pain, Sleep and Anxiety Medications  Special Instructions: If you have smoked or chewed Tobacco  in the last 2 yrs please stop smoking, stop any regular Alcohol  and or any Recreational drug use.  Wear Seat belts while driving.   Please note  You were cared for by a hospitalist during your hospital stay. If you have any questions about your discharge medications or the care you received while you  were in the hospital after you are discharged, you can call the unit and asked to speak with the hospitalist on call if the hospitalist that took care of you is not available. Once you are discharged, your primary care physician will handle any further medical issues. Please note that NO REFILLS for any discharge medications will be authorized once you are discharged, as it is imperative that you return to your primary care physician (or establish a relationship with a primary care physician if you do not have one) for your aftercare needs so that they can reassess your need for medications and monitor your lab values.     Increase activity slowly    Complete by:  As directed             Medication List    STOP taking these medications        diclofenac 75 MG EC tablet  Commonly known as:  VOLTAREN     ibuprofen 200 MG tablet  Commonly known as:  ADVIL,MOTRIN      TAKE these medications        CALCIUM PO  Take 1 tablet by mouth daily.     carvedilol 12.5 MG tablet  Commonly known as:  COREG  Take 12.5 mg by mouth 2 (two) times daily with a meal.     multivitamin with minerals Tabs tablet  Take 1 tablet by mouth daily.          Diet and Activity recommendation: See Discharge Instructions above   Consults obtained -  Ophthalmology Neurology   Major procedures and Radiology Reports - PLEASE review detailed and final reports for all details, in brief -     Ct Head Wo Contrast  07/12/2015  CLINICAL  DATA:  Headache since 07/10/2015. EXAM: CT HEAD WITHOUT CONTRAST TECHNIQUE: Contiguous axial images were obtained from the base of the skull through the vertex without intravenous contrast. COMPARISON:  MR brain 07/09/2015 FINDINGS: There is no evidence of mass effect, midline shift or extra-axial fluid collections. There is no evidence of a space-occupying lesion or intracranial hemorrhage. There is no evidence of a cortical-based area of acute infarction. The ventricles and sulci are appropriate for the patient's age. The basal cisterns are patent. Visualized portions of the orbits are unremarkable. The visualized portions of the paranasal sinuses and mastoid air cells are unremarkable. The osseous structures are unremarkable. IMPRESSION: Normal CT of the brain without intravenous contrast. Electronically Signed   By: Kathreen Devoid   On: 07/12/2015 16:28   Mr Brain W Wo Contrast  07/10/2015  CLINICAL DATA:  Initial valuation for acute visualized in right eye. History of retinal detachment, status post repair. EXAM: MRI HEAD WITHOUT AND WITH CONTRAST TECHNIQUE: Multiplanar, multiecho pulse sequences of the brain and surrounding structures were obtained without and with intravenous contrast. CONTRAST:  29m MULTIHANCE GADOBENATE DIMEGLUMINE 529 MG/ML IV SOLN COMPARISON:  None. FINDINGS: No abnormal foci of restricted diffusion to suggest acute intracranial infarct. Gray-white matter differentiation maintained. Normal intravascular flow voids are preserved. No acute or chronic intracranial hemorrhage. Cerebral volume within normal limits for patient age. Scattered T2/FLAIR hyperintensity within the periventricular, deep, and subcortical white matter present, nonspecific, but likely related to chronic small vessel ischemic disease. No mass lesion, midline shift or mass effect. No hydrocephalus. No extra-axial fluid collection. Craniocervical junction within normal limits. Pituitary gland normal. Dedicated views of  the orbits demonstrate evidence of prior scleral banding for retinal detachment at the right globe. The right globe  itself is somewhat valve long and shape. The right lens has been replaced. No evidence for retinal detachment seen on this examination. Extraocular muscles are normal and symmetric bilaterally. Superior orbital veins symmetric and within normal limits. The optic nerves themselves are symmetric in size without evidence for abnormal enhancement to suggest optic neuritis. Optic nerve cc demonstrate symmetric fluid. Optic chiasm is normal. No abnormal enhancement elsewhere within the brain. IMPRESSION: 1. No evidence for acute optic neuritis or other acute optic nerve pathology. 2. Sequela of prior scleral banding at the right globe for presumed previous retinal detachment. No findings to suggest acute retinal detachment on this examination. 3. No other acute intracranial process. 4. Mild chronic small vessel ischemic disease. Electronically Signed   By: Jeannine Boga M.D.   On: 07/10/2015 01:36   Mm Digital Screening Bilateral  06/29/2015  CLINICAL DATA:  Screening. EXAM: DIGITAL SCREENING BILATERAL MAMMOGRAM WITH CAD COMPARISON:  Previous exam(s). ACR Breast Density Category c: The breast tissue is heterogeneously dense, which may obscure small masses. FINDINGS: There are no findings suspicious for malignancy. Images were processed with CAD. IMPRESSION: No mammographic evidence of malignancy. A result letter of this screening mammogram will be mailed directly to the patient. RECOMMENDATION: Screening mammogram in one year. (Code:SM-B-01Y) BI-RADS CATEGORY  1: Negative. Electronically Signed   By: Fidela Salisbury M.D.   On: 06/29/2015 11:21   Mr Darnelle Catalan Wo/w Cm  07/10/2015  CLINICAL DATA:  Initial valuation for acute visualized in right eye. History of retinal detachment, status post repair. EXAM: MRI HEAD WITHOUT AND WITH CONTRAST TECHNIQUE: Multiplanar, multiecho pulse sequences of the  brain and surrounding structures were obtained without and with intravenous contrast. CONTRAST:  78m MULTIHANCE GADOBENATE DIMEGLUMINE 529 MG/ML IV SOLN COMPARISON:  None. FINDINGS: No abnormal foci of restricted diffusion to suggest acute intracranial infarct. Gray-white matter differentiation maintained. Normal intravascular flow voids are preserved. No acute or chronic intracranial hemorrhage. Cerebral volume within normal limits for patient age. Scattered T2/FLAIR hyperintensity within the periventricular, deep, and subcortical white matter present, nonspecific, but likely related to chronic small vessel ischemic disease. No mass lesion, midline shift or mass effect. No hydrocephalus. No extra-axial fluid collection. Craniocervical junction within normal limits. Pituitary gland normal. Dedicated views of the orbits demonstrate evidence of prior scleral banding for retinal detachment at the right globe. The right globe itself is somewhat valve long and shape. The right lens has been replaced. No evidence for retinal detachment seen on this examination. Extraocular muscles are normal and symmetric bilaterally. Superior orbital veins symmetric and within normal limits. The optic nerves themselves are symmetric in size without evidence for abnormal enhancement to suggest optic neuritis. Optic nerve cc demonstrate symmetric fluid. Optic chiasm is normal. No abnormal enhancement elsewhere within the brain. IMPRESSION: 1. No evidence for acute optic neuritis or other acute optic nerve pathology. 2. Sequela of prior scleral banding at the right globe for presumed previous retinal detachment. No findings to suggest acute retinal detachment on this examination. 3. No other acute intracranial process. 4. Mild chronic small vessel ischemic disease. Electronically Signed   By: BJeannine BogaM.D.   On: 07/10/2015 01:36    Micro Results    No results found for this or any previous visit (from the past 240  hour(s)).     Today   Subjective:   [name] has no headache,no chest abdominal pain,no new weakness tingling or numbness, feels much today.   Objective:   Blood pressure 143/72, pulse 75,  temperature 97.9 F (36.6 C), temperature source Oral, resp. rate 18, height 5' 6"  (1.676 m), weight 87.6 kg (193 lb 2 oz), SpO2 98 %.   Intake/Output Summary (Last 24 hours) at 07/13/15 1326 Last data filed at 07/13/15 0948  Gross per 24 hour  Intake    486 ml  Output    900 ml  Net   -414 ml    Exam  Awake Alert, Oriented X 3, Significantly decreased visual activity in right eye, extraocular movement intact. Supple Neck,No JVD,  Symmetrical Chest wall movement, Good air movement bilaterally, CTAB RRR,No Gallops,Rubs or new Murmurs, No Parasternal Heave +ve B.Sounds, Abd Soft, No tenderness, No organomegaly appriciated, No rebound - guarding or rigidity. No Cyanosis, Clubbing or edema, No new Rash or bruise  Data Review   CBC w Diff: Lab Results  Component Value Date   WBC 9.2 07/09/2015   HGB 13.5 07/09/2015   HCT 40.7 07/09/2015   PLT 253 07/09/2015   LYMPHOPCT 31 07/09/2015   MONOPCT 8 07/09/2015   EOSPCT 5 07/09/2015   BASOPCT 1 07/09/2015    CMP: Lab Results  Component Value Date   NA 139 07/11/2015   K 3.8 07/11/2015   CL 102 07/11/2015   CO2 26 07/11/2015   BUN 23* 07/11/2015   CREATININE 0.69 07/11/2015  .   Total Time in preparing paper work, data evaluation and todays exam - 35 minutes  Travarus Trudo M.D on 07/13/2015 at 1:26 PM  [facility]   Office  [phone]

## 2015-07-13 NOTE — Care Management Note (Addendum)
Case Management Note  Patient Details  Name: [name] MRN: [id number] Date of Birth: Apr 06, 1956  Subjective/Objective:                 Patient states she has established care for cataracts and retinal detachment in [place]. Patient expressed interest in following up earleir than appointment that is scheduled on 10/24. Patient requested CM to call and schedule with Dr Rachelle Hora at [facility]. [place] with office to call patient back to schedule appointment for this week if possible.  Plan on following up with [facility] if appointment is made earleir. Patient will plan to transfer care to Dr Dwana Melena.   Action/Plan:   Expected Discharge Date:  07/12/15               Expected Discharge Plan:  Home/Self Care  In-House Referral:     Discharge planning Services  CM Consult  Post Acute Care Choice:    Choice offered to:     DME Arranged:    DME Agency:     HH Arranged:    HH Agency:     Status of Service:  Completed, signed off  Medicare Important Message Given:    Date Medicare IM Given:    Medicare IM give by:    Date Additional Medicare IM Given:    Additional Medicare Important Message give by:     If discussed at [place] of Stay Meetings, dates discussed:    Additional Comments:  Carles Collet, RN 07/13/2015, 11:03 AM

## 2015-07-13 NOTE — Care Management Important Message (Signed)
Important Message  Patient Details  Name: [name] MRN: [id number] Date of Birth: June 27, 1956   Medicare Important Message Given:  Yes-second notification given    Nathen May 07/13/2015, 1:04 PM

## 2015-07-13 NOTE — Progress Notes (Signed)
[name] to be D/C'd Home per MD order.  Discussed with the patient and all questions fully answered.  VSS, Skin clean, dry and intact without evidence of skin break down, no evidence of skin tears noted. IV catheter discontinued intact. Site without signs and symptoms of complications. Dressing and pressure applied.  An After Visit Summary was printed and given to the patient. Patient received prescription.  D/c education completed with patient/family including follow up instructions, medication list, d/c activities limitations if indicated, with other d/c instructions as indicated by MD - patient able to verbalize understanding, all questions fully answered.   Patient instructed to return to ED, call 911, or call MD for any changes in condition.   Patient escorted via [place], and D/C home via private auto.  [name] 07/13/2015 2:30 PM

## 2015-07-13 NOTE — Discharge Instructions (Signed)
Follow with Primary MD Suzanna Obey, MD in 7 days    Get CBC, CMP, 2 view Chest X ray checked  by Primary MD next visit.    Activity: As tolerated with Full fall precautions use walker/cane & assistance as needed   Disposition Home    Diet: Heart Healthy  , with feeding assistance and aspiration precautions.  For Heart failure patients - Check your Weight same time everyday, if you gain over 2 pounds, or you develop in leg swelling, experience more shortness of breath or chest pain, call your Primary MD immediately. Follow Cardiac Low Salt Diet and 1.5 lit/day fluid restriction.   On your next visit with your primary care physician please Get Medicines reviewed and adjusted.   Please request your Prim.MD to go over all Hospital Tests and Procedure/Radiological results at the follow up, please get all Hospital records sent to your Prim MD by signing hospital release before you go home.   If you experience worsening of your admission symptoms, develop shortness of breath, life threatening emergency, suicidal or homicidal thoughts you must seek medical attention immediately by calling 911 or calling your MD immediately  if symptoms less severe.  You Must read complete instructions/literature along with all the possible adverse reactions/side effects for all the Medicines you take and that have been prescribed to you. Take any new Medicines after you have completely understood and accpet all the possible adverse reactions/side effects.   Do not drive, operating heavy machinery, perform activities at heights, swimming or participation in water activities or provide baby sitting services if your were admitted for syncope or siezures until you have seen by Primary MD or a Neurologist and advised to do so again.  Do not drive when taking Pain medications.    Do not take more than prescribed Pain, Sleep and Anxiety Medications  Special Instructions: If you have smoked or chewed Tobacco  in  the last 2 yrs please stop smoking, stop any regular Alcohol  and or any Recreational drug use.  Wear Seat belts while driving.   Please note  You were cared for by a hospitalist during your hospital stay. If you have any questions about your discharge medications or the care you received while you were in the hospital after you are discharged, you can call the unit and asked to speak with the hospitalist on call if the hospitalist that took care of you is not available. Once you are discharged, your primary care physician will handle any further medical issues. Please note that NO REFILLS for any discharge medications will be authorized once you are discharged, as it is imperative that you return to your primary care physician (or establish a relationship with a primary care physician if you do not have one) for your aftercare needs so that they can reassess your need for medications and monitor your lab values.

## 2015-07-13 NOTE — Progress Notes (Signed)
Subjective: No significant change. Able to see colors well but feels vision is blurry when looking close but improves with far vision.   Objective: Current vital signs: BP 143/72 mmHg  Pulse 75  Temp(Src) 97.9 F (36.6 C) (Oral)  Resp 18  Ht 5\' 6"  (1.676 m)  Wt 87.6 kg (193 lb 2 oz)  BMI 31.19 kg/m2  SpO2 98% Vital signs in last 24 hours: Temp:  [97.4 F (36.3 C)-97.9 F (36.6 C)] 97.9 F (36.6 C) (10/18 0453) Pulse Rate:  [69-77] 75 (10/18 0453) Resp:  [16-18] 18 (10/18 0453) BP: (129-155)/(61-85) 143/72 mmHg (10/18 0453) SpO2:  [97 %-99 %] 98 % (10/18 0453)  Intake/Output from previous day: 10/17 0701 - 10/18 0700 In: 636 [P.O.:480; IV Piggyback:156] Out: 650 [Urine:650] Intake/Output this shift:   Nutritional status: Diet regular Room service appropriate?: Yes; Fluid consistency:: Thin  Neurologic Exam: General: NAD Mental Status: Alert, oriented, thought content appropriate. Speech fluent without evidence of aphasia. Able to follow 3 step commands without difficulty. Cranial Nerves: II: Visual fields intact right eye 20/200 and left eye 20/20, pupils equal, round, reactive to light and accommodation III,IV, VI: ptosis not present, extra-ocular motions intact bilaterally V,VII: smile symmetric, facial light touch sensation normal bilaterally VIII: hearing normal bilaterally IX,X: uvula rises symmetrically XI: bilateral shoulder shrug XII: midline tongue extension without atrophy or fasciculations  Motor: Right :Upper extremity 5/5Left: Upper extremity 5/5 Lower extremity 5/5Lower extremity 5/5 Tone and bulk:normal tone throughout; no atrophy noted Sensory: Pinprick and light touch intact throughout, bilaterally Deep Tendon Reflexes:  Right: Upper Extremity Left: Upper extremity   biceps (C-5 to C-6) 2/4  biceps (C-5 to C-6) 2/4 tricep (C7) 2/4triceps (C7) 2/4 Brachioradialis (C6) 2/4Brachioradialis (C6) 2/4  Lower Extremity Lower Extremity  quadriceps (L-2 to L-4) 2/4 quadriceps (L-2 to L-4) 2/4 Achilles (S1) 0/4Achilles (S1) 0/4  Plantars: Right: downgoingLeft: downgoing  Lab Results: Basic Metabolic Panel:  Recent Labs Lab 07/09/15 1957 07/11/15 0537  NA 137 139  K 4.2 3.8  CL 104 102  CO2 26 26  GLUCOSE 115* 174*  BUN 25* 23*  CREATININE 0.80 0.69  CALCIUM 9.6 9.9    Liver Function Tests: No results for input(s): AST, ALT, ALKPHOS, BILITOT, PROT, ALBUMIN in the last 168 hours. No results for input(s): LIPASE, AMYLASE in the last 168 hours. No results for input(s): AMMONIA in the last 168 hours.  CBC:  Recent Labs Lab 07/09/15 1957  WBC 9.2  NEUTROABS 5.1  HGB 13.5  HCT 40.7  MCV 95.3  PLT 253    Cardiac Enzymes: No results for input(s): CKTOTAL, CKMB, CKMBINDEX, TROPONINI in the last 168 hours.  Lipid Panel: No results for input(s): CHOL, TRIG, HDL, CHOLHDL, VLDL, LDLCALC in the last 168 hours.  CBG:  Recent Labs Lab 07/12/15 0808 07/12/15 1211 07/12/15 1714 07/12/15 2102 07/13/15 0759  GLUCAP 136* 145* 171* 209* 124*    Microbiology: No results found for this or any previous visit.  Coagulation Studies: No results for input(s): LABPROT, INR in the last 72 hours.  Imaging: Ct Head Wo Contrast  07/12/2015  CLINICAL DATA:  Headache since 07/10/2015. EXAM: CT HEAD WITHOUT CONTRAST TECHNIQUE: Contiguous axial images were obtained from the base of the skull through the vertex without intravenous contrast. COMPARISON:  MR brain 07/09/2015 FINDINGS: There is no evidence of mass effect, midline shift or extra-axial fluid collections. There is no evidence of a space-occupying lesion or  intracranial hemorrhage. There is no evidence of a cortical-based area of  acute infarction. The ventricles and sulci are appropriate for the patient's age. The basal cisterns are patent. Visualized portions of the orbits are unremarkable. The visualized portions of the paranasal sinuses and mastoid air cells are unremarkable. The osseous structures are unremarkable. IMPRESSION: Normal CT of the brain without intravenous contrast. Electronically Signed   By: Kathreen Devoid   On: 07/12/2015 16:28    Medications:  Scheduled: . carvedilol  12.5 mg Oral BID WC  . enoxaparin (LOVENOX) injection  40 mg Subcutaneous Q24H  . methylPREDNISolone (SOLU-MEDROL) injection  250 mg Intravenous STAT  . pantoprazole  40 mg Oral BID    Assessment/Plan:  59 year old lady with probable right optic neuritis. Etiology is unclear.now 11/12 doses of solumedrol with mild improvement. Sed rate 35, ACE (-),ANA (-) and Lyme(-). CD show bilateral ICA 1-39%  Plan: 1) Continue Solumedrol for one final dose.  2) Patient will need to follow up with neurology in 1-2 weeks for follow up  Etta Quill PA-C [facility] [phone]  07/13/2015, 9:00 AM   I personally participated in this patient's evaluation and management, including formulating above clinical impression and management recommendations.  Rush Farmer M.D. [facility] [phone]

## 2015-07-19 DIAGNOSIS — H3321 Serous retinal detachment, right eye: Secondary | ICD-10-CM | POA: Insufficient documentation

## 2015-07-19 DIAGNOSIS — H35033 Hypertensive retinopathy, bilateral: Secondary | ICD-10-CM | POA: Insufficient documentation

## 2015-07-19 DIAGNOSIS — H35039 Hypertensive retinopathy, unspecified eye: Secondary | ICD-10-CM | POA: Insufficient documentation

## 2015-07-19 DIAGNOSIS — H3581 Retinal edema: Secondary | ICD-10-CM | POA: Insufficient documentation

## 2015-07-21 DIAGNOSIS — E7800 Pure hypercholesterolemia, unspecified: Secondary | ICD-10-CM

## 2015-07-21 DIAGNOSIS — I6529 Occlusion and stenosis of unspecified carotid artery: Secondary | ICD-10-CM | POA: Insufficient documentation

## 2015-07-21 DIAGNOSIS — R7309 Other abnormal glucose: Secondary | ICD-10-CM | POA: Insufficient documentation

## 2015-07-21 HISTORY — DX: Pure hypercholesterolemia, unspecified: E78.00

## 2015-07-22 ENCOUNTER — Encounter: Payer: Self-pay | Admitting: Neurology

## 2015-07-22 ENCOUNTER — Ambulatory Visit (INDEPENDENT_AMBULATORY_CARE_PROVIDER_SITE_OTHER): Payer: Medicare Other | Admitting: Neurology

## 2015-07-22 VITALS — BP 158/80 | HR 69 | Ht 66.0 in | Wt 188.0 lb

## 2015-07-22 DIAGNOSIS — R9089 Other abnormal findings on diagnostic imaging of central nervous system: Secondary | ICD-10-CM

## 2015-07-22 DIAGNOSIS — R5382 Chronic fatigue, unspecified: Secondary | ICD-10-CM | POA: Diagnosis not present

## 2015-07-22 DIAGNOSIS — R202 Paresthesia of skin: Secondary | ICD-10-CM

## 2015-07-22 DIAGNOSIS — H469 Unspecified optic neuritis: Secondary | ICD-10-CM

## 2015-07-22 DIAGNOSIS — R930 Abnormal findings on diagnostic imaging of skull and head, not elsewhere classified: Secondary | ICD-10-CM | POA: Diagnosis not present

## 2015-07-22 NOTE — Progress Notes (Signed)
PATIENT: [name] DOB: [birth year]  Chief Complaint  Patient presents with  . Visual Disturbance    She has been evaluated at [facility] by Dr. Dwana Melena.  She was informed it was possible optic neuritis.     HISTORICAL  [name] is a 59 years old right-handed female, seen in refer by her primary care physician, for evaluation of right visual distortion, possible right optic neuritis in July 22 2015,  She had a history of right retinal detachment in 2002, in early October, she noticed blurry vision, color washing out, by October 15, she realized when she close her left eye, she saw dark clusters of gray discoloration in her right  Visual field,  She was sent to the emergency room for evaluation, was diagnosed with right optic neuritis, stating hospital for IV Solu-Medrol 1000 mg 4 days,  She reported over the Past few years, she had intermittent left hand numbness, muscle cramping, but was never evaluated, she also complains bilateral lower extremity stiffness, give out underneath her sometimes. 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  She was also recently evaluated by [facility] ophthalmologist Dr. Dwana Melena, confirmed her diagnosis of right optic neuritis , she also has a history of bilateral posterior vitreous detachment, hypertensive retinopathy of both eyes, she was given prednisone tapering dose, overall her vision has improved, but not back to her baseline  I have personally reviewed MRI of the brain with and without contrast October 2015 with patient, multi supratentorium lesions, few lesions are oval-shaped, oriented towards ventricle, no contrast enhancement. This certainly raises the possibility of relapsing remitting multiple sclerosis versus small vessel disease.  She also reported previous history of brain injury, basketball bad hit her head before, falling down 13 steps stairs   in the past  Laboratory showed mild elevated glucose on BMP, normal CBC, Lyme titer, ESR was elevated 35  REVIEW OF SYSTEMS: Full 14 system review of systems performed and notable only fas above   ALLERGIES: No Known Allergies  HOME MEDICATIONS: Current Outpatient Prescriptions  Medication Sig Dispense Refill  . CALCIUM PO Take 1 tablet by mouth daily.    . carvedilol (COREG) 12.5 MG tablet Take 12.5 mg by mouth 2 (two) times daily with a meal.    . carvedilol (COREG) 12.5 MG tablet     . diclofenac (VOLTAREN) 75 MG EC tablet TAKE 1 TABLET BY MOUTH TWICE A DAY FOR INFLAMMATION AND PAIN  1  . Docusate Calcium (STOOL SOFTENER PO) Take by mouth daily.    . Misc Natural Products (JOINT HEALTH) CAPS Take by mouth.    . Multiple Vitamin (MULTIVITAMIN WITH MINERALS) TABS tablet Take 1 tablet by mouth daily.    . pravastatin (PRAVACHOL) 20 MG tablet Take 20 mg by mouth daily.    . predniSONE (DELTASONE) 20 MG tablet Take 20 mg by mouth.    . ranitidine (ZANTAC) 150 MG tablet Take 150 mg by mouth.     No current facility-administered medications for this visit.    PAST MEDICAL HISTORY: Past Medical History  Diagnosis Date  . Retinal detachment   . Hypertension   . Arthritis     PAST SURGICAL HISTORY: Past Surgical History  Procedure Laterality Date  . Fibroid tumor    . Retinal detachment surgery Right   . Cataract extraction Right   . Knee surgery Bilateral     FAMILY HISTORY: Family  History  Problem Relation Age of Onset  . Stroke Mother   . Arrhythmia Mother   . Stroke Maternal Grandfather   . Stroke Maternal Grandmother   . Heart disease Father   . Bladder Cancer Mother     SOCIAL HISTORY:  Social History   Social History  . Marital Status: Single    Spouse Name: N/A  . Number of Children: 0  . Years of Education: Masters   Occupational History  . caregiver     Part-time   Social History Main Topics  . Smoking status: Never Smoker   . Smokeless  tobacco: Not on file  . Alcohol Use: No  . Drug Use: No  . Sexual Activity: Not on file   Other Topics Concern  . Not on file   Social History Narrative   Lives at home with her sister.   Right-handed.   1 cup caffeine daily.        PHYSICAL EXAM   Filed Vitals:   07/22/15 0942  BP: 158/80  Pulse: 69  Height: _0  (1.676 m)  Weight: 188 lb (85.276 kg)    Not recorded      Body mass index is 30.36 kg/(m^2).  PHYSICAL EXAMNIATION:  Gen: NAD, conversant, well nourised, obese, well groomed                     Cardiovascular: Regular rate rhythm, no peripheral edema, warm, nontender. Eyes: Conjunctivae clear without exudates or hemorrhage Neck: Supple, no carotid bruise. Pulmonary: Clear to auscultation bilaterally   NEUROLOGICAL EXAM:  MENTAL STATUS: Speech:    Speech is normal; fluent and spontaneous with normal comprehension.  Cognition:     Orientation to time, place and person     Normal recent and remote memory     Normal Attention span and concentration     Normal Language, naming, repeating,spontaneous speech     Fund of knowledge   CRANIAL NERVES: CN II: Visual fields are full to confrontation. Fundoscopic exam is normal with sharp discs and no vascular changes. Pupils are round briskly reactive to light, she has right pupil afferent defect  CN III, IV, VI: extraocular movement are normal. No ptosis. CN V: Facial sensation is intact to pinprick in all 3 divisions bilaterally. Corneal responses are intact.  CN VII: Face is symmetric with normal eye closure and smile. CN VIII: Hearing is normal to rubbing fingers CN IX, X: Palate elevates symmetrically. Phonation is normal. CN XI: Head turning and shoulder shrug are intact CN XII: Tongue is midline with normal movements and no atrophy.  MOTOR: There is no pronator drift of out-stretched arms. Muscle bulk and tone are normal. Muscle strength is normal.  REFLEXES: Reflexes are 2+ and symmetric at the  biceps, triceps, knees 2/0 , and ankles. Plantar responses are flexor.  SENSORY: Intact to light touch, pinprick, position sense, and vibration sense are intact in fingers and toes.  COORDINATION: Rapid alternating movements and fine finger movements are intact. There is no dysmetria on finger-to-nose and heel-knee-shin.    GAIT/STANCE: Posture is normal. Gait is steady with normal steps, base, arm swing, and turning. Heel and toe walking are normal. Tandem gait is normal.  Romberg is absent.   DIAGNOSTIC DATA (LABS, IMAGING, TESTING) - I reviewed patient records, labs, notes, testing and imaging myself where available.  ASSESSMENT AND PLAN  SHANEL PRAZAK is a 59 y.o. female   Right optic neuritis,  abnormal MRI of the brain  possible relapsing remitting multiple sclerosis  Complete evaluation with MRI of cervical spine with and without contrast  Lumbar puncture   Marcial Pacas, M.D. Ph.D.  [facility] [address] [place], [place] [address] Ph: [phone] Fax: [phone]  CC: Katherina Mires, MD

## 2015-07-30 DIAGNOSIS — I6529 Occlusion and stenosis of unspecified carotid artery: Secondary | ICD-10-CM | POA: Insufficient documentation

## 2015-07-30 DIAGNOSIS — E785 Hyperlipidemia, unspecified: Secondary | ICD-10-CM | POA: Diagnosis present

## 2015-07-30 DIAGNOSIS — Z006 Encounter for examination for normal comparison and control in clinical research program: Secondary | ICD-10-CM | POA: Insufficient documentation

## 2015-08-03 ENCOUNTER — Ambulatory Visit
Admission: RE | Admit: 2015-08-03 | Discharge: 2015-08-03 | Disposition: A | Discharge status: Home / Self Care | Payer: Medicare Other | Source: Ambulatory Visit | Attending: Neurology | Admitting: Neurology

## 2015-08-03 ENCOUNTER — Other Ambulatory Visit: Payer: Self-pay | Admitting: Neurology

## 2015-08-03 DIAGNOSIS — R5382 Chronic fatigue, unspecified: Secondary | ICD-10-CM

## 2015-08-03 DIAGNOSIS — R202 Paresthesia of skin: Secondary | ICD-10-CM

## 2015-08-03 DIAGNOSIS — H269 Unspecified cataract: Secondary | ICD-10-CM | POA: Insufficient documentation

## 2015-08-03 DIAGNOSIS — H3320 Serous retinal detachment, unspecified eye: Secondary | ICD-10-CM | POA: Insufficient documentation

## 2015-08-03 DIAGNOSIS — R9089 Other abnormal findings on diagnostic imaging of central nervous system: Secondary | ICD-10-CM

## 2015-08-03 DIAGNOSIS — H469 Unspecified optic neuritis: Secondary | ICD-10-CM

## 2015-08-03 LAB — CSF CELL COUNT WITH DIFFERENTIAL
RBC COUNT CSF: 1 uL — AB
Tube #: 4
WBC CSF: 1 uL (ref 0–5)

## 2015-08-03 LAB — GRAM STAIN: GRAM STAIN: NONE SEEN

## 2015-08-03 LAB — PROTEIN, CSF: Total Protein, CSF: 44 mg/dL (ref 15–45)

## 2015-08-03 LAB — GLUCOSE, CSF: Glucose, CSF: 57 mg/dL (ref 43–76)

## 2015-08-03 NOTE — Progress Notes (Signed)
One SST tube of blood drawn from left AC space for LP labs; site unremarkable.   

## 2015-08-03 NOTE — Discharge Instructions (Signed)

## 2015-08-06 ENCOUNTER — Ambulatory Visit
Admission: RE | Admit: 2015-08-06 | Discharge: 2015-08-06 | Disposition: A | Discharge status: Home / Self Care | Payer: Medicare Other | Source: Ambulatory Visit | Attending: Neurology | Admitting: Neurology

## 2015-08-06 DIAGNOSIS — R202 Paresthesia of skin: Secondary | ICD-10-CM | POA: Diagnosis not present

## 2015-08-06 DIAGNOSIS — R5382 Chronic fatigue, unspecified: Secondary | ICD-10-CM

## 2015-08-06 DIAGNOSIS — R9089 Other abnormal findings on diagnostic imaging of central nervous system: Secondary | ICD-10-CM

## 2015-08-06 DIAGNOSIS — H469 Unspecified optic neuritis: Secondary | ICD-10-CM

## 2015-08-06 DIAGNOSIS — R930 Abnormal findings on diagnostic imaging of skull and head, not elsewhere classified: Secondary | ICD-10-CM | POA: Diagnosis not present

## 2015-08-06 LAB — ANGIOTENSIN CONVERTING ENZYME, CSF: ACE, CSF: 10 U/L (ref <=15)

## 2015-08-06 LAB — VDRL, CSF: SYPHILIS VDRL QUANT CSF: NONREACTIVE

## 2015-08-06 MED ORDER — GADOBENATE DIMEGLUMINE 529 MG/ML IV SOLN
17.0000 mL | Freq: Once | INTRAVENOUS | Status: AC | PRN
  Administered 2015-08-06: 17 mL via INTRAVENOUS

## 2015-08-08 LAB — MULTIPLE SCLEROSIS PANEL 2
ALBUMIN CSF MSPROF: 21 mg/dL (ref <35)
ALBUMIN MSPROF: 3920 mg/dL (ref 3700–5410)
Albumin Index: 5.4 (ref <9.0)
CNS-IgG Synthesis Rate: 0.8 mg/24hr (ref <3.3)
IGG-INDEX: 0.63 (ref <0.70)
IGM TOTAL: 78 mg/dL (ref 48–271)
IgA CSF: 0.2 mg/dL (ref 0.15–0.60)
IgA MSPROF: <0.001 mg/dL (ref <0.010)
IgA Total: 121 mg/dL (ref 81–463)
IgG Total CSF: 2.6 mg/dL (ref 0.5–6.1)
IgG Total: 774 mg/dL (ref 694–1618)
IgM-CSF: 0.03 mg/dL (ref <0.10)

## 2015-08-09 ENCOUNTER — Telehealth: Payer: Self-pay | Admitting: Neurology

## 2015-08-09 NOTE — Telephone Encounter (Signed)
Please call patient, MRI of the cervical spine showed severe spinal canal stenosis at C5-6 level, subtle T2 hyperintensity signal changes within the cord, may move up appointment to review the film together  IMPRESSION: This is an abnormal MRI of the cervical spine with and without contrast showed the following: 1. Severe spinal stenosis at C5-C6 with an AP diameter of 5.6 mm. There is subtle T2 hyperintense signal within the spinal cord below the point of maximal stenosis that could represent myelopathic changes associated with possible cord compression. There does not appear to be nerve root compression at this level. 2. Mild spinal stenosis at C6-C7 that does not lead to any spinal cord changes or nerve root compression. 3. Milder degenerative changes at the other cervical levels do not appear to lead to nerve root impingement. 4.. There are no acute findings and the enhancement pattern is normal.

## 2015-08-09 NOTE — Telephone Encounter (Signed)
Spoke to patient - aware of results. 

## 2015-08-09 NOTE — Telephone Encounter (Signed)
Spoke to [place] - moved her appt to 08/10/15.

## 2015-08-09 NOTE — Telephone Encounter (Signed)
Patient returned call

## 2015-08-09 NOTE — Telephone Encounter (Signed)
Left message for a return call

## 2015-08-09 NOTE — Telephone Encounter (Signed)
Please call patient, CSF showed no evidence of oligoclonal band, I will review all the findings with her at her follow-up visit in November 29

## 2015-08-10 ENCOUNTER — Encounter: Payer: Self-pay | Admitting: Neurology

## 2015-08-10 ENCOUNTER — Telehealth: Payer: Self-pay | Admitting: Neurology

## 2015-08-10 ENCOUNTER — Ambulatory Visit (INDEPENDENT_AMBULATORY_CARE_PROVIDER_SITE_OTHER): Payer: Medicare Other | Admitting: Neurology

## 2015-08-10 VITALS — BP 122/74 | HR 85 | Ht 66.0 in | Wt 188.0 lb

## 2015-08-10 DIAGNOSIS — H469 Unspecified optic neuritis: Secondary | ICD-10-CM | POA: Diagnosis not present

## 2015-08-10 DIAGNOSIS — R930 Abnormal findings on diagnostic imaging of skull and head, not elsewhere classified: Secondary | ICD-10-CM

## 2015-08-10 DIAGNOSIS — M47812 Spondylosis without myelopathy or radiculopathy, cervical region: Secondary | ICD-10-CM | POA: Diagnosis not present

## 2015-08-10 DIAGNOSIS — G4719 Other hypersomnia: Secondary | ICD-10-CM | POA: Diagnosis not present

## 2015-08-10 DIAGNOSIS — R9089 Other abnormal findings on diagnostic imaging of central nervous system: Secondary | ICD-10-CM

## 2015-08-10 NOTE — Progress Notes (Signed)
PATIENT: [name] DOB: 07/05/1956  Chief Complaint  Patient presents with  . Visual Disturbance    She has been evaluated at [facility] by Dr. Dwana Melena.  She was informed it was possible optic neuritis.     HISTORICAL  [name] is a 59 years old right-handed female, seen in refer by her primary care physician, for evaluation of right visual distortion, possible right optic neuritis in July 22 2015,  She had a history of right retinal detachment in 2002, in early October 2016, she noticed blurry vision, color washing out, by July 10 2015, she realized when she close her left eye, she saw dark clusters of gray discoloration in her right  Visual field,  She was sent to the emergency room for evaluation, was diagnosed with right optic neuritis, stating hospital for IV Solu-Medrol 1000 mg 4 days,  She reported over the past few years, she had intermittent left hand numbness, muscle cramping, but was never evaluated, she also complains bilateral lower extremity stiffness, give out underneath her sometimes. 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  She was also recently evaluated by [facility] ophthalmologist Dr. Dwana Melena, confirmed her diagnosis of right optic neuritis , she also has a history of bilateral posterior vitreous detachment, hypertensive retinopathy of both eyes, she was given prednisone tapering dose, overall her vision has improved, but not back to her baseline  I have personally reviewed MRI of the brain with and without contrast October 2015 with patient, multi supratentorium lesions, few lesions are oval-shaped, oriented towards ventricle, no contrast enhancement. This certainly raises the possibility of relapsing remitting multiple sclerosis versus small vessel disease.  She also reported previous history of brain injury, basketball bad hit her head before, falling down 13  steps stairs  in the past  Laboratory showed mild elevated glucose on BMP, normal CBC, Lyme titer, ESR was elevated 35  UPDATE Aug 10 2015:  I have personally reviewed MRI of the cervical in November third 2016 with patient: Severe spinal stenosis at C5-C6 with an AP diameter of 5.6 mm. There is subtle T2 hyperintense signal within the spinal cord below the point of maximal stenosis that could represent myelopathic changes associated with possible cord compression. Mild spinal stenosis at C6-C7 that does not lead to any spinal cord changes or nerve root compression.  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 CALCIUM PO Take 1 tablet by mouth daily.    . carvedilol (COREG) 12.5 MG tablet Take 12.5 mg by mouth 2 (two) times daily with a meal.    . carvedilol (COREG) 12.5 MG tablet     . Docusate Calcium (STOOL SOFTENER PO) Take by mouth daily.    . Misc Natural Products (JOINT HEALTH) CAPS Take by mouth.    Marland Kitchen  Multiple Vitamin (MULTIVITAMIN WITH MINERALS) TABS tablet Take 1 tablet by mouth daily.    . pravastatin (PRAVACHOL) 20 MG tablet Take 20 mg by mouth daily.    . predniSONE (DELTASONE) 20 MG tablet Take 20 mg by mouth.    . ranitidine (ZANTAC) 150 MG tablet Take 150 mg by mouth.      No current facility-administered medications for this visit.    PAST MEDICAL HISTORY: Past Medical History  Diagnosis Date  . Retinal detachment   . Hypertension   . Arthritis     PAST SURGICAL HISTORY: Past Surgical History  Procedure Laterality Date  . Fibroid tumor    . Retinal detachment surgery Right   . Cataract extraction Right   . Knee surgery Bilateral     FAMILY HISTORY: Family History  Problem Relation Age of Onset  . Stroke Mother   . Arrhythmia Mother   . Stroke Maternal Grandfather   . Stroke Maternal Grandmother   . Heart disease Father   . Bladder Cancer Mother     SOCIAL HISTORY:  Social History   Social History  . Marital Status: Single    Spouse Name: N/A  . Number of Children: 0  . Years of Education: Masters   Occupational History  . caregiver     Part-time   Social History Main Topics  . Smoking status: Never Smoker   . Smokeless tobacco: Not on file  . Alcohol Use: No  . Drug Use: No  . Sexual Activity: Not on file   Other Topics Concern  . Not on file   Social History Narrative   Lives at home with her sister.   Right-handed.   1 cup caffeine daily.        PHYSICAL EXAM   Filed Vitals:   08/10/15 1432  BP: 122/74  Pulse: 85  Height: 5' 6"  (1.676 m)  Weight: 188 lb (85.276 kg)    Not recorded      Body mass index is 30.36 kg/(m^2).  PHYSICAL EXAMNIATION:  Gen: NAD, conversant, well nourised, obese, well groomed                     Cardiovascular: Regular rate rhythm, no peripheral edema, warm, nontender. Eyes: Conjunctivae clear without exudates or hemorrhage Neck: Supple, no carotid bruise. Pulmonary: Clear to auscultation bilaterally   NEUROLOGICAL EXAM:  MENTAL STATUS: Speech:    Speech is normal; fluent and spontaneous with normal comprehension.  Cognition:     Orientation to time, place and person     Normal recent and remote memory     Normal Attention span and concentration     Normal  Language, naming, repeating,spontaneous speech     Fund of knowledge   CRANIAL NERVES: CN II: Visual fields are full to confrontation. Fundoscopic exam is normal with sharp discs and no vascular changes. Pupils are round briskly reactive to light, she has right pupil afferent defect  CN III, IV, VI: extraocular movement are normal. No ptosis. CN V: Facial sensation is intact to pinprick in all 3 divisions bilaterally. Corneal responses are intact.  CN VII: Face is symmetric with normal eye closure and smile. CN VIII: Hearing is normal to rubbing fingers CN IX, X: Palate elevates symmetrically. Phonation is normal. CN XI: Head turning and shoulder shrug are intact CN XII: Tongue is midline with normal movements and no atrophy.  MOTOR: There is no pronator drift of out-stretched arms. Muscle bulk and tone are normal. Muscle  strength is normal.  REFLEXES: Reflexes are 2+ and symmetric at the biceps, triceps, knees 2/0 , and ankles. Plantar responses are flexor.  SENSORY: Intact to light touch, pinprick, position sense, and vibration sense are intact in fingers and toes.  COORDINATION: Rapid alternating movements and fine finger movements are intact. There is no dysmetria on finger-to-nose and heel-knee-shin.    GAIT/STANCE: Mildly unsteady due to big body habitus,  DIAGNOSTIC DATA (LABS, IMAGING, TESTING) - I reviewed patient records, labs, notes, testing and imaging myself where available.  ASSESSMENT AND PLAN  [name] is a 59 y.o. female   Right optic neuritis,  abnormal MRI of the brain   0 oligoclonal banding on spinal fluid testing,  Not enough evidence to support a definite relapsing remitting multiple sclerosis  We will repeat MRI of the brain with and without contrast in one year   Cervical spondylitic myelopathy  After discussed with patient, she wants to proceed with surgical referral at this point  Obstructive sleep apnea  She: has obesity, small  oropharyngeal  ESS score is 10  Referral to sleep study  Marcial Pacas, M.D. Ph.D.  [facility] [address] [place], [place] [address] Ph: [phone] Fax: [phone]  CC: Katherina Mires, MD

## 2015-08-10 NOTE — Telephone Encounter (Signed)
Pt's sister called and would like to know how the appt went. Please call and advise [phone]

## 2015-08-10 NOTE — Telephone Encounter (Signed)
Unable to reach at home number - rang busy with multiple attempts - called patient's cell phone and left a message letting her know her sister called inquiring about her appt.  Left our number to call back with any questions.

## 2015-08-24 ENCOUNTER — Ambulatory Visit: Payer: Medicare Other | Admitting: Neurology

## 2015-08-30 LAB — FUNGUS CULTURE W SMEAR: SMEAR RESULT: NONE SEEN

## 2015-09-01 ENCOUNTER — Encounter: Payer: Self-pay | Admitting: Neurology

## 2015-09-01 ENCOUNTER — Ambulatory Visit (INDEPENDENT_AMBULATORY_CARE_PROVIDER_SITE_OTHER): Payer: Medicare Other | Admitting: Neurology

## 2015-09-01 ENCOUNTER — Telehealth: Payer: Self-pay | Admitting: *Deleted

## 2015-09-01 VITALS — BP 138/84 | HR 80 | Resp 18 | Ht 66.0 in | Wt 194.0 lb

## 2015-09-01 DIAGNOSIS — R0683 Snoring: Secondary | ICD-10-CM

## 2015-09-01 DIAGNOSIS — G2581 Restless legs syndrome: Secondary | ICD-10-CM

## 2015-09-01 DIAGNOSIS — G4719 Other hypersomnia: Secondary | ICD-10-CM

## 2015-09-01 DIAGNOSIS — G4761 Periodic limb movement disorder: Secondary | ICD-10-CM | POA: Diagnosis not present

## 2015-09-01 DIAGNOSIS — E669 Obesity, unspecified: Secondary | ICD-10-CM | POA: Diagnosis not present

## 2015-09-01 DIAGNOSIS — R351 Nocturia: Secondary | ICD-10-CM

## 2015-09-01 HISTORY — DX: Restless legs syndrome: G25.81

## 2015-09-01 NOTE — Telephone Encounter (Signed)
Per Epic, the referral was sent on 08/11/15.  I called [facility] and they told me several messages had been left for the patient on her home and cell.  I verified the numbers with them and both were correct. They will call her again today.  I also called the patient to let her know but was unable to reach her at either number.  I left message at both with [facility]'s phone number - told her she just needed to return their call for scheduling.

## 2015-09-01 NOTE — Progress Notes (Signed)
Subjective:    Patient ID: [name] is a 59 y.o. female.  HPI     Cheryl Age, MD, PhD [facility] [address], [address].O. [place], [place] [address]  Dear [name],   I saw your patient, [name], upon your kind request in my clinic today for initial consultation of her sleep disorder, in particular, concern for underlying obstructive sleep apnea. The patient is unaccompanied today. As you know, [name] is a 59 year old right-handed woman with an underlying medical history of cervical spondylosis, retinal detachment, status post surgery on the right, arthritis, status post knee surgery, hypertension, hyperlipidemia, reflux disease, history of right optic neuritis, and obesity, who reports snoring and excessive daytime somnolence. Her Epworth sleepiness score is 10 out of 24 today, her fatigue score is 60 out of 63 today. 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 She lives with her 10 year old sister [name]. She has no children. She is single. She has a history of restless leg syndrome and symptoms improved after she started wearing compression stockings during the day. She is a restless sleeper. She feels like she moves a lot in her sleep in her bed is usually disheveled in the morning. She has a little puppy who sleeps in the same bed with her. She has woken up with a sense of gasping for air. Her father had loud snoring and woke up with gasping sensations and died at the Miller of 38 from a massive heart attack. Her sister was diagnosed with sleep apnea but stopped using her CPAP machine. Patient's bedtime is around 9 PM and wake time is around 5 AM. She sometimes takes an over-the-counter sleep aid. She does not smoke, or drink alcohol. She drinks coffee one cup per day on average. She has significant nocturia, usually 2-3 times per night. She has rare morning headaches.    I reviewed your office note from  08/10/2015.  Her Past Medical History Is Significant For: Past Medical History  Diagnosis Date  . Retinal detachment   . Hypertension   . Arthritis   . Hypercholesteremia   . Retinal detachment   . Fibroid tumor   . Meniscus tear   . Spinal stenosis     Her Past Surgical History Is Significant For: Past Surgical History  Procedure Laterality Date  . Fibroid tumor    . Retinal detachment surgery Right   . Cataract extraction Right   . Knee surgery Bilateral     Her Family History Is Significant For: Family History  Problem Relation Miller of Onset  . Stroke Mother   . Arrhythmia Mother   . Bladder Cancer Mother   . Thyroid disease Mother   . Stroke Maternal Grandfather   . Stroke Maternal Grandmother   . Heart disease Father   . Heart attack Father     Her Social History Is Significant For: Social History   Social History  . Marital Status: Single    Spouse Name: N/A  . Number of Children: 0  . Years of Education: Masters   Occupational History  . caregiver     Part-time   Social History Main Topics  . Smoking status: Never Smoker   . Smokeless tobacco: Not on file  . Alcohol Use: No  . Drug Use: No  . Sexual Activity: Not on file   Other Topics Concern  . Not on file   Social History Narrative  Lives at home with her sister.   Right-handed.   1 cup coffee daily.       Her Allergies Are:  No Known Allergies:   Her Current Medications Are:  Outpatient Encounter Prescriptions as of 09/01/2015  Medication Sig  . CALCIUM PO Take 1 tablet by mouth daily.  . carvedilol (COREG) 12.5 MG tablet Take 12.5 mg by mouth 2 (two) times daily with a meal.  . carvedilol (COREG) 12.5 MG tablet   . Docusate Calcium (STOOL SOFTENER PO) Take by mouth daily.  . Misc Natural Products (JOINT HEALTH) CAPS Take by mouth.  . Multiple Vitamin (MULTIVITAMIN WITH MINERALS) TABS tablet Take 1 tablet by mouth daily.  . pravastatin (PRAVACHOL) 20 MG tablet Take 20 mg by  mouth daily.  . predniSONE (DELTASONE) 20 MG tablet Take 20 mg by mouth 3 (three) times daily.   . ranitidine (ZANTAC) 150 MG tablet Take 150 mg by mouth.   No facility-administered encounter medications on file as of 09/01/2015.  :  Review of Systems:  Out of a complete 14 point review of systems, all are reviewed and negative with the exception of these symptoms as listed below:   Review of Systems  Constitutional: Positive for fatigue.  Eyes:       Blurred vision, loss of vision.   Musculoskeletal:       Joint pain   Neurological: Positive for weakness and numbness.       Patient feels like she is sleeping better at night after hospital stay in October. She feels like she has good sleep hygiene.  Restless legs at night, has trouble going back to sleep after waking up in the night, daytime tiredness, wakes up feeling tired, denies taking naps.   Epworth Sleepiness Scale 0= would never doze 1= slight chance of dozing 2= moderate chance of dozing 3= high chance of dozing  Sitting and reading:1 Watching TV:2 Sitting inactive in a public place (ex. Theater or meeting):0 As a passenger in a car for an hour without a break:1 Lying down to rest in the afternoon:3 Sitting and talking to someone:0 Sitting quietly after lunch (no alcohol):2 In a car, while stopped in traffic:1 Total:10  Objective:  Neurologic Exam  Physical Exam Physical Examination:   Filed Vitals:   09/01/15 1536  BP: 138/84  Pulse: 80  Resp: 18   General Examination: The patient is a very pleasant 59 y.o. female in no acute distress. She appears well-developed and well-nourished and well groomed.   HEENT: Normocephalic, atraumatic, pupils are equal, round and reactive to light and accommodation. Funduscopic exam is normal with sharp disc margins noted. Extraocular tracking is good without limitation to gaze excursion or nystagmus noted. Normal smooth pursuit is noted. Hearing is grossly intact. Tympanic  membranes are clear bilaterally. Face is symmetric with normal facial animation and normal facial sensation. Speech is clear with no dysarthria noted. There is no hypophonia. There is no lip, neck/head, jaw or voice tremor. Neck is supple with full range of passive and active motion. There are no carotid bruits on auscultation. Oropharynx exam reveals: mild mouth dryness, adequate dental hygiene and moderate airway crowding, due to narrow airway entry and larger uvula and tonsils in place. Mallampati is class II. Tongue protrudes centrally and palate elevates symmetrically. Tonsils are 1+ in size. Neck size is 16 inches. She has a Mild overbite. Nasal inspection reveals no significant nasal mucosal bogginess or redness and no septal deviation.   Chest: Clear to auscultation without  wheezing, rhonchi or crackles noted.  Heart: S1+S2+0, regular and normal without murmurs, rubs or gallops noted.   Abdomen: Soft, non-tender and non-distended with normal bowel sounds appreciated on auscultation.  Extremities: There is trace pitting edema in the distal lower extremities bilaterally. Pedal pulses are intact. She is wearing compression stockings up to the knees b/l.  Skin: Warm and dry without trophic changes noted.   Musculoskeletal: exam reveals no obvious joint deformities, tenderness or joint swelling or erythema.   Neurologically:  Mental status: The patient is awake, alert and oriented in all 4 spheres. Her immediate and remote memory, attention, language skills and fund of knowledge are appropriate. There is no evidence of aphasia, agnosia, apraxia or anomia. Speech is clear with normal prosody and enunciation. Thought process is linear. Mood is normal and affect is normal.  Cranial nerves II - XII are as described above under HEENT exam. In addition: shoulder shrug is normal with equal shoulder height noted. Motor exam: Normal bulk, strength and tone is noted. There is no drift, tremor or rebound.  Romberg is negative. Reflexes are 2+ in the upper extremities, 1+ in both knees, left side more diminished. Fine motor skills and coordination appear intact. She has some deliberate movements on finger to nose testing but no frank intention tremor or dysmetria. Heel to shin is unremarkable.  Sensory exam: intact to light touch in the upper and lower extremities.  Gait, station and balance: She stands easily. No veering to one side is noted. No leaning to one side is noted. Posture is Miller-appropriate and stance is narrow based. Gait shows normal stride length and normal pace. No problems turning are noted. She turns en bloc. Tandem walk is somewhat challenging for her. She feels that she is weaker in her left leg and has trouble putting pressure on it.               Assessment and Plan:  In summary, [name] is a very pleasant 59 y.o.-year old female with an underlying medical history of cervical spondylosis, retinal detachment, status post surgery on the right, arthritis, status post knee surgery, hypertension, hyperlipidemia, reflux disease, history of right optic neuritis, and obesity, whose history and physical exam are indeed concerning for obstructive sleep apnea (OSA). I had a long chat with the patient about my findings and the diagnosis of OSA, its prognosis and treatment options. We talked about medical treatments, surgical interventions and non-pharmacological approaches. I explained in particular the risks and ramifications of untreated moderate to severe OSA, especially with respect to developing cardiovascular disease down the Road, including congestive heart failure, difficult to treat hypertension, cardiac arrhythmias, or stroke. Even type 2 diabetes has, in part, been linked to untreated OSA. Symptoms of untreated OSA include daytime sleepiness, memory problems, mood irritability and mood disorder such as depression and anxiety, lack of energy, as well as recurrent headaches, especially  morning headaches. We talked about trying to maintain a healthy lifestyle in general, as well as the importance of weight control. I encouraged the patient to eat healthy, exercise daily and keep well hydrated, to keep a scheduled bedtime and wake time routine, to not skip any meals and eat healthy snacks in between meals. I advised the patient not to drive when feeling sleepy. I recommended the following at this time: sleep study with potential positive airway pressure titration. (We will score hypopneas at 4% and split the sleep study into diagnostic and treatment portion, if the estimated. 2 hour AHI  is >20/h).   I explained the sleep test procedure to the patient and also outlined possible surgical and non-surgical treatment options of OSA, including the use of a custom-made dental device (which would require a referral to a specialist dentist or oral surgeon), upper airway surgical options, such as pillar implants, radiofrequency surgery, tongue base surgery, and UPPP (which would involve a referral to an ENT surgeon). Rarely, jaw surgery such as mandibular advancement may be considered.  I also explained the CPAP treatment option to the patient, who indicated that she would be willing to try CPAP if the need arises. I explained the importance of being compliant with PAP treatment, not only for insurance purposes but primarily to improve Her symptoms, and for the patient's long term health benefit, including to reduce Her cardiovascular risks. I answered all her questions today and the patient was in agreement. I would like to see her back after the sleep study is completed and encouraged her to call with any interim questions, concerns, problems or updates.   Thank you very much for allowing me to participate in the care of this nice patient. If I can be of any further assistance to you please do not hesitate to talk to me.   Sincerely,   Cheryl Age, MD, PhD

## 2015-09-01 NOTE — Patient Instructions (Signed)

## 2015-09-14 ENCOUNTER — Other Ambulatory Visit (HOSPITAL_COMMUNITY): Payer: Self-pay | Admitting: Neurosurgery

## 2015-10-08 DIAGNOSIS — G4733 Obstructive sleep apnea (adult) (pediatric): Secondary | ICD-10-CM | POA: Insufficient documentation

## 2015-10-12 ENCOUNTER — Telehealth: Payer: Self-pay | Admitting: Neurology

## 2015-10-12 NOTE — Telephone Encounter (Signed)
Spoke to [name] - her appt has been moved up prior to her surgery date.

## 2015-10-12 NOTE — Telephone Encounter (Signed)
Sister Vaughan Basta called regarding patient having neck surgery 11/01/14, "patient is having a really rough time" and thinks it's due to Prednisone, takes Prednisone due to eye condition and can't come off of this medication, states Dr. Krista Blue knows all about this, feels patient should see Dr. Krista Blue prior to already scheduled appointment 11/09/14 and before surgery 11/01/14.

## 2015-10-13 ENCOUNTER — Other Ambulatory Visit (HOSPITAL_COMMUNITY): Payer: Medicare Other

## 2015-10-19 ENCOUNTER — Telehealth: Payer: Self-pay | Admitting: Neurology

## 2015-10-19 ENCOUNTER — Encounter: Payer: Self-pay | Admitting: Neurology

## 2015-10-19 ENCOUNTER — Ambulatory Visit (INDEPENDENT_AMBULATORY_CARE_PROVIDER_SITE_OTHER): Payer: Medicare Other | Admitting: Neurology

## 2015-10-19 VITALS — BP 129/78 | HR 68 | Ht 66.0 in | Wt 204.0 lb

## 2015-10-19 DIAGNOSIS — M5440 Lumbago with sciatica, unspecified side: Secondary | ICD-10-CM | POA: Diagnosis not present

## 2015-10-19 DIAGNOSIS — H469 Unspecified optic neuritis: Secondary | ICD-10-CM

## 2015-10-19 DIAGNOSIS — M545 Low back pain, unspecified: Secondary | ICD-10-CM | POA: Insufficient documentation

## 2015-10-19 DIAGNOSIS — M4802 Spinal stenosis, cervical region: Secondary | ICD-10-CM

## 2015-10-19 DIAGNOSIS — M25552 Pain in left hip: Secondary | ICD-10-CM

## 2015-10-19 DIAGNOSIS — M25551 Pain in right hip: Secondary | ICD-10-CM | POA: Diagnosis not present

## 2015-10-19 NOTE — Telephone Encounter (Signed)
Left message for [name] to return call to Dr. Krista Blue concerning her order questions.

## 2015-10-19 NOTE — Patient Instructions (Addendum)
January 25-27 2017, prednisone 30mg   every morning  January 28 -Oct 25 2015 20 mg daily  Jan 31-Feb 2nd 89m daily.  Feb 3rd -Feb 6th 5mg  daily

## 2015-10-19 NOTE — Progress Notes (Signed)
PATIENT: [name] DOB: 03-18-1956  Chief Complaint  Patient presents with  . Visual Disturbance    She has been evaluated at [facility] by Dr. Dwana Melena.  She was informed it was possible optic neuritis.     HISTORICAL  [name] is a 60 years old right-handed female, seen in refer by her primary care physician, for evaluation of right visual distortion, possible right optic neuritis in July 22 2015.  She had a history of right retinal detachment in 2002, in early October 2016, she noticed blurry vision, color washing out, by July 10 2015, she realized when she close her left eye, she saw dark clusters of gray discoloration in her right visual field,  She was sent to the emergency room for evaluation, was diagnosed with right optic neuritis, stayed in hospital for IV Solu-Medrol 1000 mg for 4 days.  She reported over the past few years, she had intermittent left hand numbness, muscle cramping, but was never evaluated, she also complains bilateral lower extremity stiffness, give out underneath her sometimes. 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  She was also recently evaluated by [facility] ophthalmologist Dr. Dwana Melena, confirmed her diagnosis of right optic neuritis , she also has a history of bilateral posterior vitreous detachment, hypertensive retinopathy of both eyes, she was given prednisone tapering dose, overall her vision has improved, but not back to her baseline  I have personally reviewed MRI of the brain with and without contrast October 2015 with patient, multi supratentorium lesions, few lesions are oval-shaped, oriented towards ventricle, no contrast enhancement. This certainly raises the possibility of relapsing remitting multiple sclerosis versus small vessel disease.  She also reported previous history of brain injury, basketball bad hit her head before, falling down  13 steps stairs  in the past  Laboratory showed mild elevated glucose on BMP, normal CBC, Lyme titer, ESR was elevated 35  UPDATE Aug 10 2015:  I have personally reviewed MRI of the cervical in November third 2016 with patient: Severe spinal stenosis at C5-C6 with an AP diameter of 5.6 mm. There is subtle T2 hyperintense signal within the spinal cord below the point of maximal stenosis that could represent myelopathic changes associated with possible cord compression. Mild spinal stenosis at C6-C7 that does not lead to any spinal cord changes or nerve root compression.  Patient's right eye vision has significant improvement, about 80% back to baseline.  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 She also complains of nighttime snoring, excessive daytime sleepiness, fatigue during the daytime, ESS score is 10  UPDATE Oct 19 2015: She has developed moon face, she is now taking prednisone 24m, [facility], she was given prednisone 60 mg daily since July 19 2015, only started tapering recently, she was also started on methotrexate 2.5 milligrams, 4 tablets once a week, along with folic acid since October 12 2015, She is now on Fosamax,  She continue complains of intermittent blurry vision involving both eye, she has complicated eye issues, bilateral cataract, posterior vitreous detachment, hypertensive retinopathy, right optic neuritis, right retinal detachment, status post scleral buckle/cryo in the past, laboratory evaluation in her January visit showed no evidence of systemic inflammatory process  She will have anterior approach  cervical decompression surgery in November 02 2015.  She is now having increased pain at her both hips, she also has low back pain, getting worse since Jan 2017,  she has mild gait difficulty also related to her bilateral hip pain, she denies bowel and bladder incontinence, generalized fatigue, even bilateral upper extremity weighted down  I have reviewed ophthalmologist note and called her ophthalmologist Dr. Manuella Ghazi, we agreed on quickly tapering off her steroid dosage, keep methotrexate 2.4 milligrams 4 tablets each week    REVIEW OF SYSTEMS: Full 14 system review of systems performed and notable only for: Activity change, fatigue, loss of vision, blurred vision, daytime sleepiness, back pain, walking difficulty, dizziness, numbness, weakness   ALLERGIES: No Known Allergies  HOME MEDICATIONS: Current Outpatient Prescriptions  Medication Sig Dispense Refill  . alendronate (FOSAMAX) 70 MG tablet Take 70 mg by mouth.    Marland Kitchen CALCIUM PO Take 1 tablet by mouth daily.    . carvedilol (COREG) 12.5 MG tablet Take 12.5 mg by mouth 2 (two) times daily with a meal.    . Docusate Calcium (STOOL SOFTENER PO) Take by mouth daily.    . folic acid (FOLVITE) 1 MG tablet Take 1 mg by mouth.    . methotrexate (RHEUMATREX) 2.5 MG tablet Take 10 mg by mouth.    . Misc Natural Products (JOINT HEALTH) CAPS Take by mouth.    . Multiple Vitamin (MULTIVITAMIN WITH MINERALS) TABS tablet Take 1 tablet by mouth daily.    . pravastatin (PRAVACHOL) 20 MG tablet Take 20 mg by mouth daily.    . predniSONE (DELTASONE) 20 MG tablet Take 20 mg by mouth 3 (three) times daily.     . ranitidine (ZANTAC) 150 MG tablet Take 150 mg by mouth.     No current facility-administered medications for this visit.    PAST MEDICAL HISTORY: Past Medical History  Diagnosis Date  . Retinal detachment   . Hypertension   . Arthritis   . Hypercholesteremia   . Retinal detachment   . Fibroid tumor   . Meniscus tear   . Spinal stenosis     PAST SURGICAL HISTORY: Past Surgical History  Procedure Laterality Date  . Fibroid tumor    . Retinal detachment surgery Right   . Cataract extraction  Right   . Knee surgery Bilateral     FAMILY HISTORY: Family History  Problem Relation Age of Onset  . Stroke Mother   . Arrhythmia Mother   . Bladder Cancer Mother   . Thyroid disease Mother   . Stroke Maternal Grandfather   . Stroke Maternal Grandmother   . Heart disease Father   . Heart attack Father     SOCIAL HISTORY:  Social History   Social History  . Marital Status: Single    Spouse Name: N/A  . Number of Children: 0  . Years of Education: Masters   Occupational History  . caregiver     Part-time   Social History Main Topics  . Smoking status: Never Smoker   . Smokeless tobacco: Not on file  . Alcohol Use: No  . Drug Use: No  . Sexual Activity: Not on file   Other Topics Concern  . Not on file   Social History Narrative   Lives at home with her sister.   Right-handed.   1 cup coffee daily.        PHYSICAL EXAM   Filed Vitals:   10/19/15 0954  BP: 129/78  Pulse: 68  Height:  5' 6"  (1.676 m)  Weight: 204 lb (92.534 kg)    Not recorded      Body mass index is 32.94 kg/(m^2).  PHYSICAL EXAMNIATION:  Gen: NAD, conversant, well nourised, obese, well groomed                     Cardiovascular: Regular rate rhythm, no peripheral edema, warm, nontender. Eyes: Conjunctivae clear without exudates or hemorrhage Neck: Supple, no carotid bruise. Pulmonary: Clear to auscultation bilaterally   NEUROLOGICAL EXAM:  MENTAL STATUS: Speech:    Speech is normal; fluent and spontaneous with normal comprehension.  Cognition:     Orientation to time, place and person     Normal recent and remote memory     Normal Attention span and concentration     Normal Language, naming, repeating,spontaneous speech     Fund of knowledge   CRANIAL NERVES: CN II: Visual fields are full to confrontation. Fundoscopic exam is normal with sharp discs and no vascular changes. Pupils are round briskly reactive to light, near vision are 20/20 OU CN III, IV, VI:  extraocular movement are normal. No ptosis. CN V: Facial sensation is intact to pinprick in all 3 divisions bilaterally. Corneal responses are intact.  CN VII: Face is symmetric with normal eye closure and smile. CN VIII: Hearing is normal to rubbing fingers CN IX, X: Palate elevates symmetrically. Phonation is normal. CN XI: Head turning and shoulder shrug are intact CN XII: Tongue is midline with normal movements and no atrophy.  MOTOR: There is no pronator drift of out-stretched arms. Muscle bulk and tone are normal. Muscle strength is normal.  REFLEXES: Reflexes are 2+ and symmetric at the biceps, triceps, knees 2/0 , and ankles. Plantar responses are flexor.  SENSORY: Intact to light touch, pinprick, position sense, and vibration sense are intact in fingers and toes.  COORDINATION: Rapid alternating movements and fine finger movements are intact. There is no dysmetria on finger-to-nose and heel-knee-shin.    GAIT/STANCE: Mildly unsteady due to big body habitus,  DIAGNOSTIC DATA (LABS, IMAGING, TESTING) - I reviewed patient records, labs, notes, testing and imaging myself where available.  ASSESSMENT AND PLAN  [name] is a 60 y.o. female   Right optic neuritis,  abnormal MRI of the brain   0 oligoclonal banding on spinal fluid testing,  Not enough evidence to support a definite diagnosis relapsing remitting multiple sclerosis  We will repeat MRI of the brain with and without contrast in one year   She was treated with steroid since October 2016, had a phone discussion with her ophthalmologist Dr. Manuella Ghazi, agrees on tapering off her prednisone to avoid the long-term side effect   Cervical spondylitic myelopathy  Anterior approach cervical decompression in February 2017   Face-to-face time was 40 minutes, more than 50% was spent in counseling and coordination of care with the patient  Marcial Pacas, M.D. Ph.D.  [facility] [address] [place], [place] [address] Ph: [phone] Fax: [phone]  CC: Katherina Mires, MD

## 2015-10-19 NOTE — Telephone Encounter (Signed)
Roberta from [facility] called requesting to clarify the orders for [facility] Hip left & right. Please call her # [phone]

## 2015-10-20 NOTE — Addendum Note (Signed)
Addended by: Marcial Pacas on: 10/20/2015 08:16 AM   Modules accepted: Orders

## 2015-10-26 ENCOUNTER — Encounter (HOSPITAL_COMMUNITY)
Admission: RE | Admit: 2015-10-26 | Discharge: 2015-10-26 | Disposition: A | Discharge status: Home / Self Care | Payer: Medicare Other | Source: Ambulatory Visit | Attending: Neurosurgery | Admitting: Neurosurgery

## 2015-10-26 ENCOUNTER — Encounter (HOSPITAL_COMMUNITY): Payer: Self-pay

## 2015-10-26 ENCOUNTER — Ambulatory Visit
Admission: RE | Admit: 2015-10-26 | Discharge: 2015-10-26 | Disposition: A | Discharge status: Home / Self Care | Payer: Medicare Other | Source: Ambulatory Visit | Attending: Neurology | Admitting: Neurology

## 2015-10-26 DIAGNOSIS — M5440 Lumbago with sciatica, unspecified side: Secondary | ICD-10-CM | POA: Diagnosis not present

## 2015-10-26 DIAGNOSIS — H469 Unspecified optic neuritis: Secondary | ICD-10-CM

## 2015-10-26 DIAGNOSIS — Z01812 Encounter for preprocedural laboratory examination: Secondary | ICD-10-CM | POA: Diagnosis not present

## 2015-10-26 DIAGNOSIS — M4802 Spinal stenosis, cervical region: Secondary | ICD-10-CM | POA: Diagnosis not present

## 2015-10-26 DIAGNOSIS — M25551 Pain in right hip: Secondary | ICD-10-CM

## 2015-10-26 DIAGNOSIS — M25552 Pain in left hip: Secondary | ICD-10-CM

## 2015-10-26 DIAGNOSIS — Z01818 Encounter for other preprocedural examination: Secondary | ICD-10-CM | POA: Diagnosis not present

## 2015-10-26 DIAGNOSIS — I10 Essential (primary) hypertension: Secondary | ICD-10-CM | POA: Diagnosis not present

## 2015-10-26 HISTORY — DX: Gastro-esophageal reflux disease without esophagitis: K21.9

## 2015-10-26 HISTORY — DX: Inflammatory liver disease, unspecified: K75.9

## 2015-10-26 LAB — BASIC METABOLIC PANEL
Anion gap: 10 (ref 5–15)
BUN: 31 mg/dL — ABNORMAL HIGH (ref 6–20)
CHLORIDE: 106 mmol/L (ref 101–111)
CO2: 26 mmol/L (ref 22–32)
CREATININE: 0.84 mg/dL (ref 0.44–1.00)
Calcium: 9.4 mg/dL (ref 8.9–10.3)
GFR calc non Af Amer: >60 mL/min (ref >60)
Glucose, Bld: 128 mg/dL — ABNORMAL HIGH (ref 65–99)
POTASSIUM: 5.3 mmol/L — AB (ref 3.5–5.1)
SODIUM: 142 mmol/L (ref 135–145)

## 2015-10-26 LAB — CBC
HEMATOCRIT: 41.3 % (ref 36.0–46.0)
Hemoglobin: 13.8 g/dL (ref 12.0–15.0)
MCH: 33.7 pg (ref 26.0–34.0)
MCHC: 33.4 g/dL (ref 30.0–36.0)
MCV: 101 fL — ABNORMAL HIGH (ref 78.0–100.0)
Platelets: 279 10*3/uL (ref 150–400)
RBC: 4.09 MIL/uL (ref 3.87–5.11)
RDW: 14.1 % (ref 11.5–15.5)
WBC: 11.5 10*3/uL — ABNORMAL HIGH (ref 4.0–10.5)

## 2015-10-26 LAB — SURGICAL PCR SCREEN
MRSA, PCR: NEGATIVE
STAPHYLOCOCCUS AUREUS: NEGATIVE

## 2015-10-26 NOTE — Progress Notes (Signed)
Requested ekg + echo  From Dr. Mauricio Po  [phone].

## 2015-10-26 NOTE — Telephone Encounter (Signed)
Please call patient, x-ray of right and left hip showed no significant abnormality  I did talk with her ophthalmologist Dr. Manuella Ghazi the day of her last appointment October 19 2015, he agreed on rapid tapering off the prednisone,

## 2015-10-26 NOTE — Pre-Procedure Instructions (Signed)
[name]  10/26/2015      [id number] - [place], [place]  [address] Phone: [phone] FaxXO:[phone]    Your procedure is scheduled on   Tuesday  11/02/15  Report to [facility] Admitting at 530 A.M.  Call this number if you have problems the morning of surgery:  [phone]   Remember:  Do not eat food or drink liquids after midnight.  Take these medicines the morning of surgery with A SIP OF WATER   CARVEDILOL (COREG), PREDNISONE  , RANITIDINE         (STOP MULTIVITAMIN, VITAMINS, HERBAL MEDICINES, MOVE FREE JOINT HEALTH, ASPIRIN, IBUPROFEN/ ADVIL/ MOTRIN, GOODY POWDERS/ BC'S)   Do not wear jewelry, make-up or nail polish.  Do not wear lotions, powders, or perfumes.  You may wear deodorant.  Do not shave 48 hours prior to surgery.  Men may shave face and neck.  Do not bring valuables to the hospital.  [facility] is not responsible for any belongings or valuables.  Contacts, dentures or bridgework may not be worn into surgery.  Leave your suitcase in the car.  After surgery it may be brought to your room.  For patients admitted to the hospital, discharge time will be determined by your treatment team.  Patients discharged the day of surgery will not be allowed to drive home.   Name and phone number of your driver:    Special instructions:  [place] - Preparing for Surgery  Before surgery, you can play an important role.  Because skin is not sterile, your skin needs to be as free of germs as possible.  You can reduce the number of germs on you skin by washing with CHG (chlorahexidine gluconate) soap before surgery.  CHG is an antiseptic cleaner which kills germs and bonds with the skin to continue killing germs even after washing.  Please DO NOT use if you have an allergy to CHG or antibacterial soaps.  If your skin becomes reddened/irritated stop using the CHG and inform your nurse when you arrive  at Short Stay.  Do not shave (including legs and underarms) for at least 48 hours prior to the first CHG shower.  You may shave your face.  Please follow these instructions carefully:   1.  Shower with CHG Soap the night before surgery and the                                morning of Surgery.  2.  If you choose to wash your hair, wash your hair first as usual with your       normal shampoo.  3.  After you shampoo, rinse your hair and body thoroughly to remove the                      Shampoo.  4.  Use CHG as you would any other liquid soap.  You can apply chg directly       to the skin and wash gently with scrungie or a clean washcloth.  5.  Apply the CHG Soap to your body ONLY FROM THE NECK DOWN.        Do not use on open wounds or open sores.  Avoid contact with your eyes,       ears, mouth and genitals (private parts).  Wash genitals (private parts)  with your normal soap.  6.  Wash thoroughly, paying special attention to the area where your surgery        will be performed.  7.  Thoroughly rinse your body with warm water from the neck down.  8.  DO NOT shower/wash with your normal soap after using and rinsing off       the CHG Soap.  9.  Pat yourself dry with a clean towel.            10.  Wear clean pajamas.            11.  Place clean sheets on your bed the night of your first shower and do not        sleep with pets.  Day of Surgery  Do not apply any lotions/deoderants the morning of surgery.  Please wear clean clothes to the hospital/surgery center.    Please read over the following fact sheets that you were given. Pain Booklet, Coughing and Deep Breathing, MRSA Information and Surgical Site Infection Prevention

## 2015-10-26 NOTE — Telephone Encounter (Signed)
Spoke to Emerald - she is aware of results.  She is still having significant pain and would like her MRI results when they become available (scan completed today).

## 2015-10-28 ENCOUNTER — Telehealth: Payer: Self-pay | Admitting: Neurology

## 2015-10-28 ENCOUNTER — Other Ambulatory Visit: Payer: Self-pay | Admitting: Neurology

## 2015-10-28 MED ORDER — OXYCODONE-ACETAMINOPHEN 5-325 MG PO TABS: 1.0000 | ORAL_TABLET | ORAL | Status: DC | PRN

## 2015-10-28 NOTE — Telephone Encounter (Signed)
Please call patient, I have reviewed her MRI of lumbar spine, there is evidence of moderate to severe lumbar spinal stenosis at L4-5, which likely will explain her bilateral low back hip pain, I will review findings at her next follow-up in March   Abnormal MRI lumbar spine (without) demonstrating: 1. At L4-5: disc bulging and facet hypertrophy with moderate-severe spinal stenosis and mild biforaminal narrowing 2. At L3-4: disc bulging and facet hypertrophy with mild-moderate spinal stenosis and no foraminal narrowing 3. Additional multi-level facet hypertrophy and disc bulging as above. 4. Compared to MRI on 11/08/10, there has been some progressive worsening of degenerative spine disease and spinal stenosis.

## 2015-10-28 NOTE — Telephone Encounter (Addendum)
Spoke to Manaia - she is aware of results.  She will keep her follow up appt.  Dr. Krista Blue has supplied her with a small rx for oxycodone to help with pain.  She is aware to come to office to pick it up.

## 2015-11-01 MED ORDER — CEFAZOLIN SODIUM-DEXTROSE 2-3 GM-% IV SOLR
2.0000 g | INTRAVENOUS | Status: AC
  Administered 2015-11-02: 2 g via INTRAVENOUS
  Filled 2015-11-01: qty 50

## 2015-11-01 MED ORDER — DEXAMETHASONE SODIUM PHOSPHATE 10 MG/ML IJ SOLN
10.0000 mg | INTRAMUSCULAR | Status: AC
  Administered 2015-11-02: 10 mg via INTRAVENOUS
  Filled 2015-11-01: qty 1

## 2015-11-02 ENCOUNTER — Inpatient Hospital Stay (HOSPITAL_COMMUNITY): Payer: Medicare Other | Admitting: Certified Registered Nurse Anesthetist

## 2015-11-02 ENCOUNTER — Encounter (HOSPITAL_COMMUNITY)
Admission: RE | Disposition: A | Discharge status: Home / Self Care | Payer: Self-pay | Source: Ambulatory Visit | Attending: Neurosurgery

## 2015-11-02 ENCOUNTER — Encounter (HOSPITAL_COMMUNITY): Payer: Self-pay | Admitting: Certified Registered Nurse Anesthetist

## 2015-11-02 ENCOUNTER — Inpatient Hospital Stay (HOSPITAL_COMMUNITY): Payer: Medicare Other

## 2015-11-02 ENCOUNTER — Inpatient Hospital Stay (HOSPITAL_COMMUNITY)
Admission: RE | Admit: 2015-11-02 | Discharge: 2015-11-03 | DRG: 473 | Disposition: A | Discharge status: Home / Self Care | Payer: Medicare Other | Source: Ambulatory Visit | Attending: Neurosurgery | Admitting: Neurosurgery

## 2015-11-02 DIAGNOSIS — E7800 Pure hypercholesterolemia, unspecified: Secondary | ICD-10-CM | POA: Diagnosis present

## 2015-11-02 DIAGNOSIS — K219 Gastro-esophageal reflux disease without esophagitis: Secondary | ICD-10-CM

## 2015-11-02 DIAGNOSIS — I10 Essential (primary) hypertension: Secondary | ICD-10-CM | POA: Diagnosis present

## 2015-11-02 DIAGNOSIS — Z7952 Long term (current) use of systemic steroids: Secondary | ICD-10-CM | POA: Diagnosis not present

## 2015-11-02 DIAGNOSIS — Z79899 Other long term (current) drug therapy: Secondary | ICD-10-CM

## 2015-11-02 DIAGNOSIS — M4802 Spinal stenosis, cervical region: Principal | ICD-10-CM | POA: Diagnosis present

## 2015-11-02 DIAGNOSIS — Z419 Encounter for procedure for purposes other than remedying health state, unspecified: Secondary | ICD-10-CM

## 2015-11-02 HISTORY — PX: ANTERIOR CERVICAL DECOMP/DISCECTOMY FUSION: SHX1161

## 2015-11-02 HISTORY — DX: Gastro-esophageal reflux disease without esophagitis: K21.9

## 2015-11-02 SURGERY — ANTERIOR CERVICAL DECOMPRESSION/DISCECTOMY FUSION 2 LEVELS
Anesthesia: General | Site: Spine Cervical

## 2015-11-02 MED ORDER — DEXAMETHASONE SODIUM PHOSPHATE 4 MG/ML IJ SOLN
4.0000 mg | Freq: Four times a day (QID) | INTRAMUSCULAR | Status: AC
  Administered 2015-11-02: 4 mg via INTRAVENOUS
  Filled 2015-11-02: qty 1

## 2015-11-02 MED ORDER — LIDOCAINE HCL 4 % EX SOLN
CUTANEOUS | Status: DC | PRN
  Administered 2015-11-02: 4 mL via TOPICAL

## 2015-11-02 MED ORDER — PROPOFOL 10 MG/ML IV BOLUS
INTRAVENOUS | Status: AC
  Filled 2015-11-02: qty 40

## 2015-11-02 MED ORDER — DEXAMETHASONE 4 MG PO TABS
4.0000 mg | ORAL_TABLET | Freq: Four times a day (QID) | ORAL | Status: AC
  Administered 2015-11-02: 4 mg via ORAL
  Filled 2015-11-02: qty 1

## 2015-11-02 MED ORDER — METHOTREXATE 2.5 MG PO TABS
10.0000 mg | ORAL_TABLET | ORAL | Status: DC
  Filled 2015-11-02: qty 4

## 2015-11-02 MED ORDER — MIDAZOLAM HCL 2 MG/2ML IJ SOLN
INTRAMUSCULAR | Status: AC
  Filled 2015-11-02: qty 2

## 2015-11-02 MED ORDER — LIDOCAINE HCL (CARDIAC) 20 MG/ML IV SOLN
INTRAVENOUS | Status: AC
  Filled 2015-11-02: qty 5

## 2015-11-02 MED ORDER — MIDAZOLAM HCL 5 MG/5ML IJ SOLN
INTRAMUSCULAR | Status: DC | PRN
  Administered 2015-11-02: 2 mg via INTRAVENOUS

## 2015-11-02 MED ORDER — ACETAMINOPHEN 325 MG PO TABS: 650.0000 mg | ORAL_TABLET | ORAL | Status: DC | PRN

## 2015-11-02 MED ORDER — CEFAZOLIN SODIUM-DEXTROSE 2-3 GM-% IV SOLR
2.0000 g | Freq: Three times a day (TID) | INTRAVENOUS | Status: AC
  Administered 2015-11-02 (×2): 2 g via INTRAVENOUS
  Filled 2015-11-02 (×2): qty 50

## 2015-11-02 MED ORDER — HYDROMORPHONE HCL 1 MG/ML IJ SOLN
1.0000 mg | INTRAMUSCULAR | Status: DC | PRN
  Administered 2015-11-02: 1 mg via INTRAMUSCULAR
  Filled 2015-11-02: qty 1

## 2015-11-02 MED ORDER — LACTATED RINGERS IV SOLN
INTRAVENOUS | Status: DC | PRN
  Administered 2015-11-02 (×2): via INTRAVENOUS

## 2015-11-02 MED ORDER — ONDANSETRON HCL 4 MG/2ML IJ SOLN
4.0000 mg | INTRAMUSCULAR | Status: DC | PRN
  Administered 2015-11-02: 4 mg via INTRAVENOUS
  Filled 2015-11-02: qty 2

## 2015-11-02 MED ORDER — SODIUM CHLORIDE 0.9 % IJ SOLN
INTRAMUSCULAR | Status: AC
  Filled 2015-11-02: qty 10

## 2015-11-02 MED ORDER — HEMOSTATIC AGENTS (NO CHARGE) OPTIME
TOPICAL | Status: DC | PRN
  Administered 2015-11-02: 1 via TOPICAL

## 2015-11-02 MED ORDER — MEPERIDINE HCL 25 MG/ML IJ SOLN: 6.2500 mg | INTRAMUSCULAR | Status: DC | PRN

## 2015-11-02 MED ORDER — METHOCARBAMOL 1000 MG/10ML IJ SOLN
500.0000 mg | Freq: Four times a day (QID) | INTRAVENOUS | Status: DC | PRN
  Filled 2015-11-02: qty 5

## 2015-11-02 MED ORDER — ACETAMINOPHEN 650 MG RE SUPP: 650.0000 mg | RECTAL | Status: DC | PRN

## 2015-11-02 MED ORDER — SUCCINYLCHOLINE CHLORIDE 20 MG/ML IJ SOLN
INTRAMUSCULAR | Status: AC
  Filled 2015-11-02: qty 1

## 2015-11-02 MED ORDER — EPHEDRINE SULFATE 50 MG/ML IJ SOLN
INTRAMUSCULAR | Status: AC
  Filled 2015-11-02: qty 1

## 2015-11-02 MED ORDER — PROMETHAZINE HCL 25 MG/ML IJ SOLN
12.5000 mg | Freq: Four times a day (QID) | INTRAMUSCULAR | Status: DC | PRN
  Administered 2015-11-02: 25 mg via INTRAMUSCULAR
  Filled 2015-11-02: qty 1

## 2015-11-02 MED ORDER — SODIUM CHLORIDE 0.9% FLUSH
3.0000 mL | Freq: Two times a day (BID) | INTRAVENOUS | Status: DC
  Administered 2015-11-02: 3 mL via INTRAVENOUS

## 2015-11-02 MED ORDER — SODIUM CHLORIDE 0.9 % IR SOLN
Status: DC | PRN
  Administered 2015-11-02: 500 mL

## 2015-11-02 MED ORDER — DOCUSATE SODIUM 100 MG PO CAPS: 100.0000 mg | ORAL_CAPSULE | Freq: Every day | ORAL | Status: DC | PRN

## 2015-11-02 MED ORDER — LIDOCAINE HCL (CARDIAC) 20 MG/ML IV SOLN
INTRAVENOUS | Status: DC | PRN
  Administered 2015-11-02: 100 mg via INTRAVENOUS

## 2015-11-02 MED ORDER — PANTOPRAZOLE SODIUM 40 MG IV SOLR
40.0000 mg | Freq: Every day | INTRAVENOUS | Status: DC
  Administered 2015-11-02: 40 mg via INTRAVENOUS
  Filled 2015-11-02: qty 40

## 2015-11-02 MED ORDER — THROMBIN 5000 UNITS EX SOLR
CUTANEOUS | Status: DC | PRN
Start: 2015-11-02 — End: 2015-11-02
  Administered 2015-11-02 (×2): 5000 [IU] via TOPICAL

## 2015-11-02 MED ORDER — HYDROMORPHONE HCL 1 MG/ML IJ SOLN
INTRAMUSCULAR | Status: AC
  Filled 2015-11-02: qty 1

## 2015-11-02 MED ORDER — METHOCARBAMOL 500 MG PO TABS
ORAL_TABLET | ORAL | Status: AC
  Filled 2015-11-02: qty 1

## 2015-11-02 MED ORDER — FENTANYL CITRATE (PF) 250 MCG/5ML IJ SOLN
INTRAMUSCULAR | Status: AC
  Filled 2015-11-02: qty 5

## 2015-11-02 MED ORDER — PROPOFOL 10 MG/ML IV BOLUS
INTRAVENOUS | Status: DC | PRN
  Administered 2015-11-02: 120 mg via INTRAVENOUS

## 2015-11-02 MED ORDER — KCL IN DEXTROSE-NACL 20-5-0.45 MEQ/L-%-% IV SOLN
80.0000 mL/h | INTRAVENOUS | Status: DC
  Filled 2015-11-02: qty 1000

## 2015-11-02 MED ORDER — METHOCARBAMOL 500 MG PO TABS
500.0000 mg | ORAL_TABLET | Freq: Four times a day (QID) | ORAL | Status: DC | PRN
  Administered 2015-11-02 – 2015-11-03 (×2): 500 mg via ORAL
  Filled 2015-11-02: qty 1

## 2015-11-02 MED ORDER — OXYCODONE-ACETAMINOPHEN 5-325 MG PO TABS
1.0000 | ORAL_TABLET | ORAL | Status: DC | PRN
  Administered 2015-11-02 – 2015-11-03 (×2): 2 via ORAL
  Filled 2015-11-02 (×2): qty 2

## 2015-11-02 MED ORDER — HYDROMORPHONE HCL 1 MG/ML IJ SOLN
0.2500 mg | INTRAMUSCULAR | Status: DC | PRN
  Administered 2015-11-02 (×2): 0.5 mg via INTRAVENOUS

## 2015-11-02 MED ORDER — CARVEDILOL 6.25 MG PO TABS
12.5000 mg | ORAL_TABLET | Freq: Two times a day (BID) | ORAL | Status: DC
  Administered 2015-11-02 – 2015-11-03 (×2): 12.5 mg via ORAL
  Filled 2015-11-02 (×2): qty 2

## 2015-11-02 MED ORDER — NEOSTIGMINE METHYLSULFATE 10 MG/10ML IV SOLN
INTRAVENOUS | Status: AC
  Filled 2015-11-02: qty 1

## 2015-11-02 MED ORDER — FENTANYL CITRATE (PF) 100 MCG/2ML IJ SOLN
INTRAMUSCULAR | Status: DC | PRN
  Administered 2015-11-02 (×2): 50 ug via INTRAVENOUS
  Administered 2015-11-02: 100 ug via INTRAVENOUS

## 2015-11-02 MED ORDER — ARTIFICIAL TEARS OP OINT
TOPICAL_OINTMENT | OPHTHALMIC | Status: AC
  Filled 2015-11-02: qty 3.5

## 2015-11-02 MED ORDER — ONDANSETRON HCL 4 MG/2ML IJ SOLN
INTRAMUSCULAR | Status: AC
  Filled 2015-11-02: qty 2

## 2015-11-02 MED ORDER — MENTHOL 3 MG MT LOZG
1.0000 | LOZENGE | OROMUCOSAL | Status: DC | PRN
  Filled 2015-11-02: qty 9

## 2015-11-02 MED ORDER — EPHEDRINE SULFATE 50 MG/ML IJ SOLN
INTRAMUSCULAR | Status: DC | PRN
  Administered 2015-11-02 (×3): 15 mg via INTRAVENOUS

## 2015-11-02 MED ORDER — ROCURONIUM BROMIDE 100 MG/10ML IV SOLN
INTRAVENOUS | Status: DC | PRN
  Administered 2015-11-02: 50 mg via INTRAVENOUS

## 2015-11-02 MED ORDER — ONDANSETRON HCL 4 MG/2ML IJ SOLN: 4.0000 mg | Freq: Once | INTRAMUSCULAR | Status: DC | PRN

## 2015-11-02 MED ORDER — PREDNISONE 10 MG PO TABS: 10.0000 mg | ORAL_TABLET | Freq: Every day | ORAL | Status: DC

## 2015-11-02 MED ORDER — SODIUM CHLORIDE 0.9% FLUSH: 3.0000 mL | INTRAVENOUS | Status: DC | PRN

## 2015-11-02 MED ORDER — PHENOL 1.4 % MT LIQD: 1.0000 | OROMUCOSAL | Status: DC | PRN

## 2015-11-02 MED ORDER — ONDANSETRON HCL 4 MG/2ML IJ SOLN
INTRAMUSCULAR | Status: DC | PRN
  Administered 2015-11-02: 4 mg via INTRAVENOUS

## 2015-11-02 MED ORDER — GLYCOPYRROLATE 0.2 MG/ML IJ SOLN
INTRAMUSCULAR | Status: AC
  Filled 2015-11-02: qty 3

## 2015-11-02 MED ORDER — 0.9 % SODIUM CHLORIDE (POUR BTL) OPTIME
TOPICAL | Status: DC | PRN
  Administered 2015-11-02: 1000 mL

## 2015-11-02 MED ORDER — ROCURONIUM BROMIDE 50 MG/5ML IV SOLN
INTRAVENOUS | Status: AC
  Filled 2015-11-02: qty 1

## 2015-11-02 MED ORDER — ARTIFICIAL TEARS OP OINT
TOPICAL_OINTMENT | OPHTHALMIC | Status: DC | PRN
  Administered 2015-11-02: 1 via OPHTHALMIC

## 2015-11-02 SURGICAL SUPPLY — 54 items
BAG DECANTER FOR FLEXI CONT (MISCELLANEOUS) ×3 IMPLANT
BENZOIN TINCTURE PRP APPL 2/3 (GAUZE/BANDAGES/DRESSINGS) ×3 IMPLANT
BRUSH SCRUB EZ PLAIN DRY (MISCELLANEOUS) ×3 IMPLANT
BUR MATCHSTICK NEURO 3.0 LAGG (BURR) ×3 IMPLANT
CANISTER SUCT 3000ML PPV (MISCELLANEOUS) ×3 IMPLANT
CLOSURE WOUND 1/2 X4 (GAUZE/BANDAGES/DRESSINGS) ×1
DRAPE C-ARM 42X72 X-RAY (DRAPES) ×6 IMPLANT
DRAPE LAPAROTOMY 100X72 PEDS (DRAPES) ×3 IMPLANT
DRAPE MICROSCOPE LEICA (MISCELLANEOUS) ×3 IMPLANT
DRAPE SURG 17X23 STRL (DRAPES) ×6 IMPLANT
DRSG OPSITE POSTOP 4X6 (GAUZE/BANDAGES/DRESSINGS) ×3 IMPLANT
DURAPREP 6ML APPLICATOR 50/CS (WOUND CARE) ×3 IMPLANT
ELECT COATED BLADE 2.86 ST (ELECTRODE) ×3 IMPLANT
ELECT REM PT RETURN 9FT ADLT (ELECTROSURGICAL) ×3
ELECTRODE REM PT RTRN 9FT ADLT (ELECTROSURGICAL) ×1 IMPLANT
GAUZE SPONGE 4X4 12PLY STRL (GAUZE/BANDAGES/DRESSINGS) IMPLANT
GAUZE SPONGE 4X4 16PLY XRAY LF (GAUZE/BANDAGES/DRESSINGS) IMPLANT
GLOVE BIOGEL PI IND STRL 7.0 (GLOVE) ×2 IMPLANT
GLOVE BIOGEL PI IND STRL 8.5 (GLOVE) ×1 IMPLANT
GLOVE BIOGEL PI INDICATOR 7.0 (GLOVE) ×4
GLOVE BIOGEL PI INDICATOR 8.5 (GLOVE) ×2
GLOVE ECLIPSE 8.0 STRL XLNG CF (GLOVE) ×3 IMPLANT
GLOVE EXAM NITRILE LRG STRL (GLOVE) IMPLANT
GLOVE EXAM NITRILE MD LF STRL (GLOVE) ×3 IMPLANT
GLOVE EXAM NITRILE XL STR (GLOVE) IMPLANT
GLOVE EXAM NITRILE XS STR PU (GLOVE) IMPLANT
GLOVE SURG SS PI 6.5 STRL IVOR (GLOVE) ×6 IMPLANT
GOWN STRL REUS W/ TWL LRG LVL3 (GOWN DISPOSABLE) ×1 IMPLANT
GOWN STRL REUS W/ TWL XL LVL3 (GOWN DISPOSABLE) ×1 IMPLANT
GOWN STRL REUS W/TWL 2XL LVL3 (GOWN DISPOSABLE) IMPLANT
GOWN STRL REUS W/TWL LRG LVL3 (GOWN DISPOSABLE) ×2
GOWN STRL REUS W/TWL XL LVL3 (GOWN DISPOSABLE) ×2
HALTER HD/CHIN CERV TRACTION D (MISCELLANEOUS) ×3 IMPLANT
KIT BASIN OR (CUSTOM PROCEDURE TRAY) ×3 IMPLANT
KIT ROOM TURNOVER OR (KITS) ×3 IMPLANT
NEEDLE SPNL 20GX3.5 QUINCKE YW (NEEDLE) ×3 IMPLANT
NS IRRIG 1000ML POUR BTL (IV SOLUTION) ×3 IMPLANT
PACK LAMINECTOMY NEURO (CUSTOM PROCEDURE TRAY) ×3 IMPLANT
PAD ARMBOARD 7.5X6 YLW CONV (MISCELLANEOUS) ×9 IMPLANT
PATTIES SURGICAL .75X.75 (GAUZE/BANDAGES/DRESSINGS) IMPLANT
PLATE 38MM (Plate) ×3 IMPLANT
PUTTY BONE GRAFT KIT 2.5ML (Bone Implant) ×3 IMPLANT
RUBBERBAND STERILE (MISCELLANEOUS) ×6 IMPLANT
SCREW SD FIXED 12MM (Screw) ×18 IMPLANT
SPACER TM-S 11X14X5-7 (Spacer) ×6 IMPLANT
SPONGE INTESTINAL PEANUT (DISPOSABLE) ×3 IMPLANT
SPONGE SURGIFOAM ABS GEL SZ50 (HEMOSTASIS) ×3 IMPLANT
STRIP CLOSURE SKIN 1/2X4 (GAUZE/BANDAGES/DRESSINGS) ×2 IMPLANT
SUT PDS AB 5-0 P3 18 (SUTURE) ×3 IMPLANT
SUT VIC AB 3-0 CP2 18 (SUTURE) ×3 IMPLANT
TOWEL OR 17X24 6PK STRL BLUE (TOWEL DISPOSABLE) ×3 IMPLANT
TOWEL OR 17X26 10 PK STRL BLUE (TOWEL DISPOSABLE) ×3 IMPLANT
TRAP SPECIMEN MUCOUS 40CC (MISCELLANEOUS) ×3 IMPLANT
WATER STERILE IRR 1000ML POUR (IV SOLUTION) ×3 IMPLANT

## 2015-11-02 NOTE — Progress Notes (Signed)
Utilization review completed.  

## 2015-11-02 NOTE — Progress Notes (Signed)
Patient ID: [name], female   DOB: 1956-02-26, 60 y.o.   MRN: [id number] Patient awake, alert, conversant No new neuro issues Wound clean and dry. Stable post op check.

## 2015-11-02 NOTE — Op Note (Signed)
Preop diagnosis: Herniated disc with spinal stenosis C5-6 C6-7 Postop diagnosis: Same Procedure: C5-C6 C6-7 decompressive anterior cervical discectomy with trabecular metal interbody fusion and and Trinica anterior cervical plating Surgeon: Teauna Dubach Asst.: Ditty  After and placed in the supine position and 10 pounds halter traction the patient's neck was prepped and draped in the usual sterile fashion. Localizing x-rays taken prior to incision to identify the appropriate level. Transverse incision was made in the right anterior neck started the midline and headed towards the medial aspect of the sternal cremaster muscle. The platysma muscle was incised transversely and the natural fascial plane between the strap muscles medially and the sternal cremaster laterally was identified and followed down to the anterior aspect of the cervical spine. Longus Cole muscles were identified split in the midline and stripped away bilaterally with unipolar coagulation and Kitner dissection. Self retaining retractor was placed for exposure Dr. should approach the appropriate levels. Using a 15 blade the S the disc at C5-6 and C6-7 was incised. His pituitary rongeurs and curettes proximal and 90% of the disc material was removed. High-speed drill was used to widen the interspace and bony shavings were saved for use later in the case. At this time the microscope was draped brought in the field and used for the remainder of the case. Using microsecond technique the remainder of the disc material down the posterior longitudinal ligament was removed. Ligament was then incised transversely and the cut edges removed a Kerrison punch. Bony overgrowth was removed as well to decompress the underlying dura which was well visualized well decompressed from proximal foramen to proximal foramen bilaterally. This was done at both levels. At this time inspection was carried out throughout and all directions for any evidence of residual  compression and none could be identified. Measurements were taken and to 5 mm trabecular metal lordotic graft were chosen and filled with a mixture of autologous bone and morselized allograft. After irrigated once more confirming hemostasis the plugs were impacted difficulty and fossae showed them to be in good position. An appropriately length anterior cervical plate was then chosen. 612 mm screws were placed without difficulty and locking mechanism was rotated locked position. Final fluoroscopy showed good position of the plates screws and plugs. Irrigation was carried out and any bleeding control proper coagulation. The was then closed with inverted Vicryl on the platysma muscle and subcuticular layer. Steri-Strips were placed on the skin. Shortness was then applied and the patient was extubated and taken to recovery in stable condition.

## 2015-11-02 NOTE — H&P (Signed)
[name] is an 60 y.o. female.   Chief Complaint: Cervical spinal stenosis HPI: The patient is a 60 year old female who is evaluated in the office for cervical spinal stenosis. She had an episode of right eye visual issues a few months ago while she was hospitalized for workup for optic neuritis she had a cervical MRI scan. This showed significant stenosis and she was referred for evaluation. Patient does note that over the last year she is felt that she had some leg weakness and feels sometimes that her legs are going to hold Route. In addition she says that her hands may cramp up. She has no other issues with neck pain or radicular pain. Her MRI scan was reviewed which showed significant spinal stenosis at C5-6 and moderate stenosis at C6-7. There was some mild increased signal within the spinal cord consistent with some early myelomalacia. The options were discussed with the patient in the risks of cervical spinal stenosis were discussed. Once informed the patient requested surgery now comes for a two-level anterior cervical discectomy with fusion and plating. I've had a long discussion with her regarding the risks and benefits of surgical intervention. The risks discussed include but are not limited to bleeding infection weakness some as paralysis spinal fluid leak coma quadriplegia hoarseness and death. She's had the opportunity numerous questions and appears to understand. With this information in hand she has requested that we proceed with surgery.  Past Medical History  Diagnosis Date  . Retinal detachment   . Hypertension   . Arthritis   . Hypercholesteremia   . Retinal detachment   . Fibroid tumor   . Meniscus tear   . Spinal stenosis   . GERD (gastroesophageal reflux disease)   . Hepatitis     AS CHILD      Past Surgical History  Procedure Laterality Date  . Fibroid tumor    . Retinal detachment surgery Right   . Cataract extraction Right   . Knee surgery Bilateral      Family History  Problem Relation Age of Onset  . Stroke Mother   . Arrhythmia Mother   . Bladder Cancer Mother   . Thyroid disease Mother   . Stroke Maternal Grandfather   . Stroke Maternal Grandmother   . Heart disease Father   . Heart attack Father    Social History:  reports that she has never smoked. She does not have any smokeless tobacco history on file. She reports that she does not drink alcohol or use illicit drugs.  Allergies: No Known Allergies  Medications Prior to Admission  Medication Sig Dispense Refill  . alendronate (FOSAMAX) 70 MG tablet Take 70 mg by mouth once a week.     Marland Kitchen CALCIUM PO Take 1 tablet by mouth daily.    . carvedilol (COREG) 12.5 MG tablet Take 12.5 mg by mouth 2 (two) times daily with a meal.    . docusate sodium (COLACE) 100 MG capsule Take 100 mg by mouth daily as needed for mild constipation.    . folic acid (FOLVITE) 1 MG tablet Take 1 mg by mouth.    . Glucos-Chond-Hyal Ac-Ca Fructo (MOVE FREE JOINT HEALTH ADVANCE PO) Take 1 tablet by mouth 2 (two) times daily.    . methotrexate (RHEUMATREX) 2.5 MG tablet Take 10 mg by mouth once a week.     . Multiple Vitamin (MULTIVITAMIN WITH MINERALS) TABS tablet Take 1 tablet by mouth daily.    . pravastatin (PRAVACHOL) 20 MG tablet  Take 20 mg by mouth daily.    . predniSONE (DELTASONE) 20 MG tablet Take 10 mg by mouth daily with breakfast.     . ranitidine (ZANTAC) 150 MG tablet Take 150 mg by mouth 2 (two) times daily as needed for heartburn.     Marland Kitchen oxyCODONE-acetaminophen (PERCOCET) 5-325 MG tablet Take 1 tablet by mouth every 4 (four) hours as needed for severe pain. 30 tablet 0    No results found for this or any previous visit (from the past 48 hour(s)). No results found.  Significant for the issues mentioned history of present illness along with some difficulty with concentration and occasional shortness of breath  Blood pressure 167/74, pulse 77, temperature 98.5 F (36.9 C), temperature  source Oral, height 5\' 6"  (1.676 m), weight 90.719 kg (200 lb), SpO2 97 %.  The patient is awake alert and oriented. She has no facial asymmetry. She has 2+ left upper extremity reflexes 1+ right upper extremity reflexes. She has decreased left intrinsic muscle strength. Her sensation is intact. Assessment/Plan Impression is that of spinal stenosis at C5-6 and C6-7. The plan is for a two-level anterior cervical discectomy with fusion and plating.  Faythe Ghee, MD 11/02/2015, 7:20 AM

## 2015-11-02 NOTE — Anesthesia Preprocedure Evaluation (Signed)
Anesthesia Evaluation  Patient identified by MRN, date of birth, ID band Patient awake    Reviewed: Allergy & Precautions, NPO status , Patient's Chart, lab work & pertinent test results  Airway Mallampati: II  TM Distance: >3 FB Neck ROM: Full    Dental   Pulmonary    Pulmonary exam normal        Cardiovascular hypertension, Pt. on medications Normal cardiovascular exam     Neuro/Psych    GI/Hepatic GERD  Medicated and Controlled,(+) Hepatitis -  Endo/Other    Renal/GU      Musculoskeletal   Abdominal   Peds  Hematology   Anesthesia Other Findings   Reproductive/Obstetrics                             Anesthesia Physical Anesthesia Plan  ASA: II  Anesthesia Plan: General   Post-op Pain Management:    Induction: Intravenous  Airway Management Planned: Oral ETT  Additional Equipment:   Intra-op Plan:   Post-operative Plan: Extubation in OR  Informed Consent: I have reviewed the patients History and Physical, chart, labs and discussed the procedure including the risks, benefits and alternatives for the proposed anesthesia with the patient or authorized representative who has indicated his/her understanding and acceptance.     Plan Discussed with: CRNA and Surgeon  Anesthesia Plan Comments:         Anesthesia Quick Evaluation

## 2015-11-02 NOTE — Anesthesia Postprocedure Evaluation (Signed)
Anesthesia Post Note  Patient: [name]  Procedure(s) Performed: Procedure(s) (LRB): ANTERIOR CERVICAL DECOMPRESSION FUSION CERVICAL FIVE-SIX,CERVICAL SIX-SEVEN (N/A)  Patient location during evaluation: PACU Anesthesia Type: General Level of consciousness: awake and alert Pain management: pain level controlled Vital Signs Assessment: post-procedure vital signs reviewed and stable Respiratory status: spontaneous breathing, nonlabored ventilation, respiratory function stable and patient connected to nasal cannula oxygen Cardiovascular status: blood pressure returned to baseline and stable Postop Assessment: no signs of nausea or vomiting Anesthetic complications: no    Last Vitals:  Filed Vitals:   11/02/15 1045 11/02/15 1053  BP:    Pulse: 80 82  Temp:  36.4 C  Resp: 19 13    Last Pain:  Filed Vitals:   11/02/15 1053  PainSc: [place] DAVID

## 2015-11-02 NOTE — Transfer of Care (Signed)
Immediate Anesthesia Transfer of Care Note  Patient: [name]  Procedure(s) Performed: Procedure(s) with comments: ANTERIOR CERVICAL DECOMPRESSION FUSION CERVICAL FIVE-SIX,CERVICAL SIX-SEVEN (N/A) - right side approach  Patient Location: PACU  Anesthesia Type:General  Level of Consciousness: awake, alert , oriented and patient cooperative  Airway & Oxygen Therapy: Patient Spontanous Breathing and Patient connected to face mask oxygen  Post-op Assessment: Report given to RN, Post -op Vital signs reviewed and stable and Patient moving all extremities X 4  Post vital signs: Reviewed and stable  Last Vitals:  BP 160/83 HR 88 RR 18 SpO2 99 on 4L FM Resting comfortably, maintains good airway, denies pain.   Complications: No apparent anesthesia complications

## 2015-11-02 NOTE — Anesthesia Procedure Notes (Signed)
Procedure Name: Intubation Date/Time: 11/02/2015 7:39 AM Performed by: Willeen Cass P Pre-anesthesia Checklist: Patient identified, Timeout performed, Emergency Drugs available, Suction available and Patient being monitored Patient Re-evaluated:Patient Re-evaluated prior to inductionOxygen Delivery Method: Circle system utilized Preoxygenation: Pre-oxygenation with 100% oxygen Intubation Type: IV induction Ventilation: Mask ventilation without difficulty and Oral airway inserted - appropriate to patient size Laryngoscope Size: Mac and 3 Grade View: Grade I Tube type: Oral Tube size: 7.0 mm Number of attempts: 1 Airway Equipment and Method: Stylet and LTA kit utilized Placement Confirmation: ETT inserted through vocal cords under direct vision,  breath sounds checked- equal and bilateral and positive ETCO2 Secured at: 22 cm Tube secured with: Tape Dental Injury: Teeth and Oropharynx as per pre-operative assessment

## 2015-11-03 ENCOUNTER — Encounter (HOSPITAL_COMMUNITY): Payer: Self-pay | Admitting: Neurosurgery

## 2015-11-03 NOTE — Discharge Summary (Signed)
  Physician Discharge Summary  Patient ID: [name] MRN: [id number] DOB/AGE: 06/13/1956 60 y.o.  Admit date: 11/02/2015 Discharge date: 11/03/2015  Admission Diagnoses:  Discharge Diagnoses:  Active Problems:   Cervical stenosis of spinal canal   Discharged Condition: good  Hospital Course: Surgery yesterday for 2 level acdf for stenosis. Did well with no neuro issues after and minimal pain. Wound healing well. Home pod 1 after ambulating. Specific instructions given.  Consults: None  Significant Diagnostic Studies: none  Treatments: surgery: C 56 C 67 acdf  Discharge Exam: Blood pressure 142/80, pulse 95, temperature 98.3 F (36.8 C), temperature source Oral, resp. rate 18, height 5\' 6"  (1.676 m), weight 90.719 kg (200 lb), SpO2 99 %. Incision/Wound:clean and dry; no new neuro issues  Disposition: 01-Home or Self Care     Medication List    ASK your doctor about these medications        alendronate 70 MG tablet  Commonly known as:  FOSAMAX  Take 70 mg by mouth once a week.     CALCIUM PO  Take 1 tablet by mouth daily.     carvedilol 12.5 MG tablet  Commonly known as:  COREG  Take 12.5 mg by mouth 2 (two) times daily with a meal.     docusate sodium 100 MG capsule  Commonly known as:  COLACE  Take 100 mg by mouth daily as needed for mild constipation.     folic acid 1 MG tablet  Commonly known as:  FOLVITE  Take 1 mg by mouth.     methotrexate 2.5 MG tablet  Commonly known as:  RHEUMATREX  Take 10 mg by mouth once a week.     MOVE FREE JOINT HEALTH ADVANCE PO  Take 1 tablet by mouth 2 (two) times daily.     multivitamin with minerals Tabs tablet  Take 1 tablet by mouth daily.     oxyCODONE-acetaminophen 5-325 MG tablet  Commonly known as:  PERCOCET  Take 1 tablet by mouth every 4 (four) hours as needed for severe pain.     PRAVACHOL 20 MG tablet  Generic drug:  pravastatin  Take 20 mg by mouth daily.     predniSONE 20 MG tablet  Commonly  known as:  DELTASONE  Take 10 mg by mouth daily with breakfast.     ranitidine 150 MG tablet  Commonly known as:  ZANTAC  Take 150 mg by mouth 2 (two) times daily as needed for heartburn.         At home rest most of the time. Get up 9 or 10 times each day and take a 15 or 20 minute walk. No riding in the car and to your first postoperative appointment. If you have neck surgery you may shower from the chest down starting on the third postoperative day. If you had back surgery he may start showering on the third postoperative day with saran wrap wrapped around your incisional area 3 times. After the shower remove the saran wrap. Take pain medicine as needed and other medications as instructed. Call my office for an appointment.  SignedFaythe Ghee, MD 11/03/2015, 10:14 AM

## 2015-11-03 NOTE — Progress Notes (Signed)
Patient alert and oriented, mae's well, voiding adequate amount of urine, swallowing without difficulty, no c/o pain. Patient discharged home with family. Script and discharged instructions given to patient. Patient and family stated understanding of d/c instructions given and has an appointment with MD. 

## 2015-11-09 DIAGNOSIS — Z79899 Other long term (current) drug therapy: Secondary | ICD-10-CM | POA: Insufficient documentation

## 2015-11-10 ENCOUNTER — Ambulatory Visit: Payer: Medicare Other | Admitting: Neurology

## 2015-12-01 DIAGNOSIS — M48061 Spinal stenosis, lumbar region without neurogenic claudication: Secondary | ICD-10-CM | POA: Insufficient documentation

## 2015-12-07 ENCOUNTER — Ambulatory Visit (INDEPENDENT_AMBULATORY_CARE_PROVIDER_SITE_OTHER): Payer: Medicare Other | Admitting: Neurology

## 2015-12-07 ENCOUNTER — Encounter: Payer: Self-pay | Admitting: Neurology

## 2015-12-07 VITALS — BP 172/83 | HR 72 | Ht 66.0 in | Wt 203.0 lb

## 2015-12-07 DIAGNOSIS — M4716 Other spondylosis with myelopathy, lumbar region: Secondary | ICD-10-CM | POA: Insufficient documentation

## 2015-12-07 DIAGNOSIS — M5440 Lumbago with sciatica, unspecified side: Secondary | ICD-10-CM | POA: Diagnosis not present

## 2015-12-07 DIAGNOSIS — I10 Essential (primary) hypertension: Secondary | ICD-10-CM | POA: Diagnosis not present

## 2015-12-07 DIAGNOSIS — M5416 Radiculopathy, lumbar region: Secondary | ICD-10-CM | POA: Diagnosis not present

## 2015-12-07 DIAGNOSIS — R930 Abnormal findings on diagnostic imaging of skull and head, not elsewhere classified: Secondary | ICD-10-CM

## 2015-12-07 DIAGNOSIS — R269 Unspecified abnormalities of gait and mobility: Secondary | ICD-10-CM | POA: Insufficient documentation

## 2015-12-07 DIAGNOSIS — R9089 Other abnormal findings on diagnostic imaging of central nervous system: Secondary | ICD-10-CM

## 2015-12-07 DIAGNOSIS — M47812 Spondylosis without myelopathy or radiculopathy, cervical region: Secondary | ICD-10-CM | POA: Diagnosis not present

## 2015-12-07 MED ORDER — GABAPENTIN 100 MG PO CAPS: 300.0000 mg | ORAL_CAPSULE | Freq: Three times a day (TID) | ORAL | Status: DC

## 2015-12-07 MED ORDER — CARVEDILOL 25 MG PO TABS: 25.0000 mg | ORAL_TABLET | Freq: Two times a day (BID) | ORAL | Status: DC

## 2015-12-07 NOTE — Progress Notes (Signed)
Chief Complaint  Patient presents with  . Cervical Pain    She had surgery on 11/02/15 by Dr. Hal Neer. She is having pain in her right hand.  . Back Pain    She is scheduled for an epidural steroid injection on 12/16/15.  She is having difficulty walking.  She has decided to hold her Pravastatin to see if it will help her leg pain will improve.  Marland Kitchen Optic Nerve Neuritis    She has tapered off Prednisone.  She recently had an eye exam and had to get new glasses.      PATIENT: [name] DOB: March 13, 1956  Chief Complaint  Patient presents with  . Visual Disturbance    She has been evaluated at [facility] by Dr. Dwana Melena.  She was informed it was possible optic neuritis.     HISTORICAL  [name] is a 60 years old right-handed female, seen in refer by her primary care physician, for evaluation of right visual distortion, possible right optic neuritis in July 22 2015.  She had a history of right retinal detachment in 2002, in early October 2016, she noticed blurry vision, color washing out, by July 10 2015, she realized when she close her left eye, she saw dark clusters of gray discoloration in her right visual field,  She was sent to the emergency room for evaluation, was diagnosed with right optic neuritis, stayed in hospital for IV Solu-Medrol 1000 mg for 4 days.  She reported over the past few years, she had intermittent left hand numbness, muscle cramping, but was never evaluated, she also complains bilateral lower extremity stiffness, give out underneath her sometimes. 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  She was also recently evaluated by [facility] ophthalmologist Dr. Dwana Melena, confirmed her diagnosis of right optic neuritis , she also has a history of bilateral posterior vitreous detachment, hypertensive retinopathy of both eyes, she was given prednisone tapering dose, overall her  vision has improved, but not back to her baseline  I have personally reviewed MRI of the brain with and without contrast October 2015 with patient, multi supratentorium lesions, few lesions are oval-shaped, oriented towards ventricle, no contrast enhancement. This certainly raises the possibility of relapsing remitting multiple sclerosis versus small vessel disease.  She also reported previous history of brain injury, basketball bad hit her head before, falling down 13 steps stairs  in the past  Laboratory showed mild elevated glucose on BMP, normal CBC, Lyme titer, ESR was elevated 35  UPDATE Aug 10 2015:  I have personally reviewed MRI of the cervical in November third 2016 with patient: Severe spinal stenosis at C5-C6 with an AP diameter of 5.6 mm. There is subtle T2 hyperintense signal within the spinal cord below the point of maximal stenosis that could represent myelopathic changes associated with possible cord compression. Mild spinal stenosis at C6-C7 that does not lead to any spinal cord changes or nerve root compression.  Patient's right eye vision has significant improvement, about 80% back to baseline.  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 She also complains of nighttime snoring, excessive daytime sleepiness, fatigue during the daytime, ESS score is 10  UPDATE Oct 19 2015: She has developed moon face, she is now taking prednisone 11m, prescribed  by [facility] ophthalmologist Dr.Shah, she was given prednisone 60 mg daily since July 19 2015, only started tapering recently, she was also started on methotrexate 2.5 milligrams, 4 tablets once a week, along with folic acid since October 12 2015, She is now on Fosamax,  She continue complains of intermittent blurry vision involving both eye, she has complicated  eye issues, bilateral cataract, posterior vitreous detachment, hypertensive retinopathy, right optic neuritis, right retinal detachment, status post scleral buckle/cryo in the past, laboratory evaluation in her January visit showed no evidence of systemic inflammatory process  She will have anterior approach cervical decompression surgery in November 02 2015.  She is now having increased pain at her both hips, she also has low back pain, getting worse since Jan 2017, she has mild gait difficulty also related to her bilateral hip pain, she denies bowel and bladder incontinence, generalized fatigue, even bilateral upper extremity weighted down  I have reviewed ophthalmologist note and called her ophthalmologist Dr. Manuella Ghazi, we agreed on quickly tapering off her steroid dosage, keep methotrexate 2.4 milligrams 4 tablets each week   Update December 07 2015: She had anterior cervical approach decompression and fusion of C5-6, C6-7 in November 02 2015, she complained of diffuse body achy pain, shortness of breath with minimum exertion  She has been complaining of worsening low back pain since January 2017, used to radiate to both hip, lower extremity, right leg has much improved, but she still has significant left hip pain, especially after bearing weight, she has increased nocturia,  We have personally reviewed x-ray of right and left hip, there was no significant pathology.  MRI of lumbar February 2017: Multilevel degenerative changes, most severe at L4-5: disc bulging and facet hypertrophy with moderate-severe spinal stenosis and mild biforaminal narrowing. L3-4: disc bulging and facet hypertrophy with mild-moderate spinal stenosis and no foraminal narrowing. Additional multi-level facet hypertrophy and disc bulging as above. Compared to MRI on 11/08/10, there has been some progressive worsening of degenerative spine disease and spinal stenosis.  The MRI findings, especially moderate to severe L4-5 spinal canal  stenosis, and moderate to severe bilateral foraminal stenosis explains her complains of worsening low back pain, radiating pain to left lower extremity and gait difficulty, she is scheduled to have epidural injection in next few days   REVIEW OF SYSTEMS: Full 14 system review of systems performed and notable only for: Activity change, fatigue, blurred vision, weakness, joint pain, low back pain, achy muscles, walking difficulty   ALLERGIES: No Known Allergies  HOME MEDICATIONS: Current Outpatient Prescriptions  Medication Sig Dispense Refill  . alendronate (FOSAMAX) 70 MG tablet Take 70 mg by mouth once a week.     Marland Kitchen CALCIUM PO Take 1 tablet by mouth daily.    . carvedilol (COREG) 12.5 MG tablet Take 12.5 mg by mouth 2 (two) times daily with a meal.    . docusate sodium (COLACE) 100 MG capsule Take 100 mg by mouth daily as needed for mild constipation.    . folic acid (FOLVITE) 1 MG tablet Take 1 mg by mouth.    . Glucos-Chond-Hyal Ac-Ca Fructo (MOVE FREE JOINT HEALTH ADVANCE PO) Take 1 tablet by mouth 2 (two) times daily.    . methotrexate (RHEUMATREX) 2.5 MG tablet Take 10 mg by mouth once a week.     Marland Kitchen oxyCODONE-acetaminophen (PERCOCET) 5-325 MG tablet Take 1 tablet by mouth every 4 (four) hours as needed for severe pain. 30 tablet 0  . pravastatin (PRAVACHOL) 20 MG tablet Take 20  mg by mouth daily.    . ranitidine (ZANTAC) 150 MG tablet Take 150 mg by mouth 2 (two) times daily as needed for heartburn.      No current facility-administered medications for this visit.    PAST MEDICAL HISTORY: Past Medical History  Diagnosis Date  . Retinal detachment   . Hypertension   . Arthritis   . Hypercholesteremia   . Retinal detachment   . Fibroid tumor   . Meniscus tear   . Spinal stenosis   . GERD (gastroesophageal reflux disease)   . Hepatitis     AS CHILD      PAST SURGICAL HISTORY: Past Surgical History  Procedure Laterality Date  . Fibroid tumor    . Retinal detachment  surgery Right   . Cataract extraction Right   . Knee surgery Bilateral   . Anterior cervical decomp/discectomy fusion N/A 11/02/2015    Procedure: ANTERIOR CERVICAL DECOMPRESSION FUSION CERVICAL FIVE-SIX,CERVICAL SIX-SEVEN;  Surgeon: Karie Chimera, MD;  Location: [facility];  Service: Neurosurgery;  Laterality: N/A;  right side approach    FAMILY HISTORY: Family History  Problem Relation Age of Onset  . Stroke Mother   . Arrhythmia Mother   . Bladder Cancer Mother   . Thyroid disease Mother   . Stroke Maternal Grandfather   . Stroke Maternal Grandmother   . Heart disease Father   . Heart attack Father     SOCIAL HISTORY:  Social History   Social History  . Marital Status: Single    Spouse Name: N/A  . Number of Children: 0  . Years of Education: Masters   Occupational History  . caregiver     Part-time   Social History Main Topics  . Smoking status: Never Smoker   . Smokeless tobacco: Not on file  . Alcohol Use: No  . Drug Use: No  . Sexual Activity: Not on file   Other Topics Concern  . Not on file   Social History Narrative   Lives at home with her sister.   Right-handed.   1 cup coffee daily.        PHYSICAL EXAM   Filed Vitals:   12/07/15 0745  BP: 172/83  Pulse: 72  Height: 5' 6"  (1.676 m)  Weight: 203 lb (92.08 kg)    Not recorded      Body mass index is 32.78 kg/(m^2).  PHYSICAL EXAMNIATION:  Gen: NAD, conversant, well nourised, obese, well groomed                     Cardiovascular: Regular rate rhythm, no peripheral edema, warm, nontender. Eyes: Conjunctivae clear without exudates or hemorrhage Neck: Supple, no carotid bruise. Pulmonary: Clear to auscultation bilaterally   NEUROLOGICAL EXAM:  MENTAL STATUS: Speech:    Speech is normal; fluent and spontaneous with normal comprehension.  Cognition:     Orientation to time, place and person     Normal recent and remote memory     Normal Attention span and concentration      Normal Language, naming, repeating,spontaneous speech     Fund of knowledge   CRANIAL NERVES: CN II: Visual fields are full to confrontation. Fundoscopic exam showed mild blurry edge at bilateral fundi. Pupils are round briskly reactive to light, near vision are 20/20 OU CN III, IV, VI: extraocular movement are normal. No ptosis. CN V: Facial sensation is intact to pinprick in all 3 divisions bilaterally. Corneal responses are intact.  CN VII: Face is symmetric  with normal eye closure and smile. CN VIII: Hearing is normal to rubbing fingers CN IX, X: Palate elevates symmetrically. Phonation is normal. CN XI: Head turning and shoulder shrug are intact CN XII: Tongue is midline with normal movements and no atrophy.  MOTOR: There is no pronator drift of out-stretched arms. Muscle bulk and tone are normal. She has mild bilateral hip flexion weakness  REFLEXES: Reflexes are 2+ and symmetric at the biceps, triceps, knees 2/0 , absent at ankles. Plantar responses are flexor.  SENSORY: Length dependent decreased to light touch, pinprick, position sense, and vibration sense are intact in fingers and toes.  COORDINATION: Rapid alternating movements and fine finger movements are intact. There is no dysmetria on finger-to-nose and heel-knee-shin.    GAIT/STANCE: Mildly unsteady due to left hip lower extremity pain   DIAGNOSTIC DATA (LABS, IMAGING, TESTING) - I reviewed patient records, labs, notes, testing and imaging myself where available.  ASSESSMENT AND PLAN  [name] is a 60 y.o. female   Right optic neuritis,  abnormal MRI of the brain   0 oligoclonal banding on spinal fluid testing,  Not enough evidence to support a definite diagnosis relapsing remitting multiple sclerosis  We will repeat MRI of the brain with and without contrast in one year (last MRI of the brain October 2016)   She was treated with steroid since October 2016, had a phone discussion with her ophthalmologist  Dr. Manuella Ghazi, agrees on tapering off her prednisone to avoid the long-term side effe, she is now on methotrexate  Cervical spondylitic myelopathy  Anterior approach cervical decompression in February 2017 Bilateral lower extremity pain   most consistent with lumbar radiculopathy  Proceed with EMG nerve conduction study  Gabapentin titrating to 300 mg 3 times a day  Hypertension  Today's blood pressure is 172/83, she reported home systolic blood pressure 092-330 range  I will increase her Coreg to 25 mg twice a day  Marcial Pacas, M.D. Ph.D.  [facility] [address] [place], [place] [address] Ph: [phone] Fax: [phone]  CC: Katherina Mires, MD

## 2015-12-14 ENCOUNTER — Other Ambulatory Visit (HOSPITAL_COMMUNITY)
Admission: RE | Admit: 2015-12-14 | Discharge: 2015-12-14 | Disposition: A | Discharge status: Home / Self Care | Payer: Medicare Other | Source: Ambulatory Visit | Attending: Ophthalmology | Admitting: Ophthalmology

## 2015-12-14 DIAGNOSIS — H469 Unspecified optic neuritis: Secondary | ICD-10-CM | POA: Insufficient documentation

## 2015-12-14 LAB — CBC WITH DIFFERENTIAL/PLATELET
Basophils Absolute: 0.1 10*3/uL (ref 0.0–0.1)
Basophils Relative: 1 %
EOS ABS: 0.3 10*3/uL (ref 0.0–0.7)
Eosinophils Relative: 5 %
HEMATOCRIT: 35 % — AB (ref 36.0–46.0)
HEMOGLOBIN: 11.6 g/dL — AB (ref 12.0–15.0)
LYMPHS ABS: 2.1 10*3/uL (ref 0.7–4.0)
Lymphocytes Relative: 32 %
MCH: 33 pg (ref 26.0–34.0)
MCHC: 33.1 g/dL (ref 30.0–36.0)
MCV: 99.4 fL (ref 78.0–100.0)
Monocytes Absolute: 0.6 10*3/uL (ref 0.1–1.0)
Monocytes Relative: 9 %
NEUTROS ABS: 3.4 10*3/uL (ref 1.7–7.7)
NEUTROS PCT: 53 %
Platelets: 243 10*3/uL (ref 150–400)
RBC: 3.52 MIL/uL — AB (ref 3.87–5.11)
RDW: 13.7 % (ref 11.5–15.5)
WBC: 6.5 10*3/uL (ref 4.0–10.5)

## 2015-12-14 LAB — COMPREHENSIVE METABOLIC PANEL
ALT: 29 U/L (ref 14–54)
AST: 23 U/L (ref 15–41)
Albumin: 3.5 g/dL (ref 3.5–5.0)
Alkaline Phosphatase: 58 U/L (ref 38–126)
Anion gap: 11 (ref 5–15)
BUN: 22 mg/dL — ABNORMAL HIGH (ref 6–20)
CHLORIDE: 106 mmol/L (ref 101–111)
CO2: 27 mmol/L (ref 22–32)
CREATININE: 0.85 mg/dL (ref 0.44–1.00)
Calcium: 9.6 mg/dL (ref 8.9–10.3)
Glucose, Bld: 108 mg/dL — ABNORMAL HIGH (ref 65–99)
POTASSIUM: 4.6 mmol/L (ref 3.5–5.1)
SODIUM: 144 mmol/L (ref 135–145)
Total Bilirubin: 0.5 mg/dL (ref 0.3–1.2)
Total Protein: 6 g/dL — ABNORMAL LOW (ref 6.5–8.1)

## 2015-12-23 ENCOUNTER — Ambulatory Visit (INDEPENDENT_AMBULATORY_CARE_PROVIDER_SITE_OTHER): Payer: Medicare Other | Admitting: Neurology

## 2015-12-23 DIAGNOSIS — R269 Unspecified abnormalities of gait and mobility: Secondary | ICD-10-CM

## 2015-12-23 DIAGNOSIS — M5440 Lumbago with sciatica, unspecified side: Secondary | ICD-10-CM

## 2015-12-23 DIAGNOSIS — R9089 Other abnormal findings on diagnostic imaging of central nervous system: Secondary | ICD-10-CM

## 2015-12-23 DIAGNOSIS — I10 Essential (primary) hypertension: Secondary | ICD-10-CM

## 2015-12-23 DIAGNOSIS — M47812 Spondylosis without myelopathy or radiculopathy, cervical region: Secondary | ICD-10-CM

## 2015-12-23 DIAGNOSIS — R930 Abnormal findings on diagnostic imaging of skull and head, not elsewhere classified: Secondary | ICD-10-CM | POA: Diagnosis not present

## 2015-12-23 DIAGNOSIS — M5416 Radiculopathy, lumbar region: Secondary | ICD-10-CM

## 2015-12-23 MED ORDER — LISINOPRIL 10 MG PO TABS: 10.0000 mg | ORAL_TABLET | Freq: Every day | ORAL | Status: DC

## 2015-12-23 NOTE — Progress Notes (Signed)
No chief complaint on file.     PATIENT: [name] DOB: [birth year]  Chief Complaint  Patient presents with  . Visual Disturbance    She has been evaluated at [facility] by Dr. Dwana Melena.  She was informed it was possible optic neuritis.     HISTORICAL  [name] is a 60 years old right-handed female, seen in refer by her primary care physician, for evaluation of right visual distortion, possible right optic neuritis in July 22 2015.  She had a history of right retinal detachment in 2002, in early October 2016, she noticed blurry vision, color washing out, by July 10 2015, she realized when she close her left eye, she saw dark clusters of gray discoloration in her right visual field,  She was sent to the emergency room for evaluation, was diagnosed with right optic neuritis, stayed in hospital for IV Solu-Medrol 1000 mg for 4 days.  She reported over the past few years, she had intermittent left hand numbness, muscle cramping, but was never evaluated, she also complains bilateral lower extremity stiffness, give out underneath her sometimes. 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  She was also recently evaluated by [facility] ophthalmologist Dr. Dwana Melena, confirmed her diagnosis of right optic neuritis , she also has a history of bilateral posterior vitreous detachment, hypertensive retinopathy of both eyes, she was given prednisone tapering dose, overall her vision has improved, but not back to her baseline  I have personally reviewed MRI of the brain with and without contrast October 2015 with patient, multi supratentorium lesions, few lesions are oval-shaped, oriented towards ventricle, no contrast enhancement. This certainly raises the possibility of relapsing remitting multiple sclerosis versus small vessel disease.  She also reported previous history of brain injury, basketball bad hit  her head before, falling down 13 steps stairs  in the past  Laboratory showed mild elevated glucose on BMP, normal CBC, Lyme titer, ESR was elevated 35  UPDATE Aug 10 2015:  I have personally reviewed MRI of the cervical in November third 2016 with patient: Severe spinal stenosis at C5-C6 with an AP diameter of 5.6 mm. There is subtle T2 hyperintense signal within the spinal cord below the point of maximal stenosis that could represent myelopathic changes associated with possible cord compression. Mild spinal stenosis at C6-C7 that does not lead to any spinal cord changes or nerve root compression.  Patient's right eye vision has significant improvement, about 80% back to baseline.  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, she was given prednisone 60 mg daily since July 19 2015, only started tapering recently, she was also started on methotrexate 2.5 milligrams, 4 tablets once a week, along with folic acid since October 12 2015, She is now on Fosamax,  She continue complains of intermittent blurry vision involving both eye, she has complicated eye issues, bilateral cataract, posterior vitreous detachment, hypertensive retinopathy, right optic neuritis, right retinal detachment, status post scleral buckle/cryo in the past, laboratory evaluation in her January visit showed no evidence of systemic inflammatory  process  She will have anterior approach cervical decompression surgery in November 02 2015.  She is now having increased pain at her both hips, she also has low back pain,  getting worse since Jan 2017, she has mild gait difficulty also related to her bilateral hip pain, she denies bowel and bladder incontinence, generalized fatigue, even bilateral upper extremity weighted down  I have reviewed ophthalmologist note and called her ophthalmologist Dr. Manuella Ghazi, we agreed on quickly tapering off her steroid dosage, keep methotrexate 2.4 milligrams 4 tablets each week   Update December 07 2015: She had anterior cervical approach decompression and fusion of C5-6, C6-7 in November 02 2015, she complained of diffuse body achy pain, shortness of breath with minimum exertion  She has been complaining of worsening low back pain since January 2017, used to radiate to both hip, lower extremity, right leg has much improved, but she still has significant left hip pain, especially after bearing weight, she has increased nocturia,  We have personally reviewed x-ray of right and left hip, there was no significant pathology.  MRI of lumbar February 2017: Multilevel degenerative changes, most severe at L4-5: disc bulging and facet hypertrophy with moderate-severe spinal stenosis and mild biforaminal narrowing. L3-4: disc bulging and facet hypertrophy with mild-moderate spinal stenosis and no foraminal narrowing. Additional multi-level facet hypertrophy and disc bulging as above. Compared to MRI on 11/08/10, there has been some progressive worsening of degenerative spine disease and spinal stenosis.  The MRI findings, especially moderate to severe L4-5 spinal canal stenosis, and moderate to severe bilateral foraminal stenosis explains her complains of worsening low back pain, radiating pain to left lower extremity and gait difficulty, she is scheduled to have epidural injection in next few days   Update December 23 2015: Patient returned for electrodiagnostic study today, there is no evidence of large fiber peripheral neuropathy or active bilateral lumbosacral radiculopathy  Her low back pain has  much improved with epidural injection by pain management Dr. Luan Pulling, she can walk better, she denies bowel and bladder incontinence,  Today her main concern is continue elevated blood pressure, she has been taking daily blood pressure, it was in the range of 150/90, heart rate 75-80. Despite titrating dose of Coreg from 12 point 5 daily to current 25 mg twice a day.  REVIEW OF SYSTEMS: Full 14 system review of systems performed and notable only for: As above  ALLERGIES: No Known Allergies  HOME MEDICATIONS: Current Outpatient Prescriptions  Medication Sig Dispense Refill  . alendronate (FOSAMAX) 70 MG tablet Take 70 mg by mouth once a week.     Marland Kitchen CALCIUM PO Take 1 tablet by mouth daily.    . carvedilol (COREG) 25 MG tablet Take 1 tablet (25 mg total) by mouth 2 (two) times daily with a meal. 60 tablet 3  . docusate sodium (COLACE) 100 MG capsule Take 100 mg by mouth daily as needed for mild constipation.    . folic acid (FOLVITE) 1 MG tablet Take 1 mg by mouth.    . gabapentin (NEURONTIN) 100 MG capsule Take 3 capsules (300 mg total) by mouth 3 (three) times daily. 270 capsule 3  . Glucos-Chond-Hyal Ac-Ca Fructo (MOVE FREE JOINT HEALTH ADVANCE PO) Take 1 tablet by mouth 2 (two) times daily.    Marland Kitchen lisinopril (ZESTRIL) 10 MG tablet Take 1 tablet (10 mg total) by mouth daily. 30 tablet 6  . methotrexate (RHEUMATREX) 2.5 MG tablet Take 10 mg by mouth once a week.     Marland Kitchen  oxyCODONE-acetaminophen (PERCOCET) 5-325 MG tablet Take 1 tablet by mouth every 4 (four) hours as needed for severe pain. 30 tablet 0  . pravastatin (PRAVACHOL) 20 MG tablet Take 20 mg by mouth daily.    . ranitidine (ZANTAC) 150 MG tablet Take 150 mg by mouth 2 (two) times daily as needed for heartburn.      No current facility-administered medications for this visit.    PAST MEDICAL HISTORY: Past Medical History  Diagnosis Date  . Retinal detachment   . Hypertension   . Arthritis   . Hypercholesteremia   . Retinal  detachment   . Fibroid tumor   . Meniscus tear   . Spinal stenosis   . GERD (gastroesophageal reflux disease)   . Hepatitis     AS CHILD      PAST SURGICAL HISTORY: Past Surgical History  Procedure Laterality Date  . Fibroid tumor    . Retinal detachment surgery Right   . Cataract extraction Right   . Knee surgery Bilateral   . Anterior cervical decomp/discectomy fusion N/A 11/02/2015    Procedure: ANTERIOR CERVICAL DECOMPRESSION FUSION CERVICAL FIVE-SIX,CERVICAL SIX-SEVEN;  Surgeon: Karie Chimera, MD;  Location: [facility];  Service: Neurosurgery;  Laterality: N/A;  right side approach    FAMILY HISTORY: Family History  Problem Relation Age of Onset  . Stroke Mother   . Arrhythmia Mother   . Bladder Cancer Mother   . Thyroid disease Mother   . Stroke Maternal Grandfather   . Stroke Maternal Grandmother   . Heart disease Father   . Heart attack Father     SOCIAL HISTORY:  Social History   Social History  . Marital Status: Single    Spouse Name: N/A  . Number of Children: 0  . Years of Education: Masters   Occupational History  . caregiver     Part-time   Social History Main Topics  . Smoking status: Never Smoker   . Smokeless tobacco: Not on file  . Alcohol Use: No  . Drug Use: No  . Sexual Activity: Not on file   Other Topics Concern  . Not on file   Social History Narrative   Lives at home with her sister.   Right-handed.   1 cup coffee daily.        PHYSICAL EXAM   There were no vitals filed for this visit.  Not recorded      There is no weight on file to calculate BMI.  PHYSICAL EXAMNIATION:  Gen: NAD, conversant, well nourised, obese, well groomed                     Cardiovascular: Regular rate rhythm, no peripheral edema, warm, nontender. Eyes: Conjunctivae clear without exudates or hemorrhage Neck: Supple, no carotid bruise. Pulmonary: Clear to auscultation bilaterally   NEUROLOGICAL EXAM:  MENTAL STATUS: Speech:    Speech  is normal; fluent and spontaneous with normal comprehension.  Cognition:     Orientation to time, place and person     Normal recent and remote memory     Normal Attention span and concentration     Normal Language, naming, repeating,spontaneous speech     Fund of knowledge   CRANIAL NERVES: CN II: Visual fields are full to confrontation. Fundoscopic exam showed mild blurry edge at bilateral fundi. Pupils are round briskly reactive to light, near vision are 20/20 OU CN III, IV, VI: extraocular movement are normal. No ptosis. CN V: Facial sensation is intact to  pinprick in all 3 divisions bilaterally. Corneal responses are intact.  CN VII: Face is symmetric with normal eye closure and smile. CN VIII: Hearing is normal to rubbing fingers CN IX, X: Palate elevates symmetrically. Phonation is normal. CN XI: Head turning and shoulder shrug are intact CN XII: Tongue is midline with normal movements and no atrophy.  MOTOR: There is no pronator drift of out-stretched arms. Muscle bulk and tone are normal. She has mild bilateral hip flexion weakness  REFLEXES: Reflexes are 2+ and symmetric at the biceps, triceps, knees 2/0 , absent at ankles. Plantar responses are flexor.  SENSORY: Length dependent decreased to light touch, pinprick, position sense, and vibration sense are intact in fingers and toes.  COORDINATION: Rapid alternating movements and fine finger movements are intact. There is no dysmetria on finger-to-nose and heel-knee-shin.    GAIT/STANCE: Steady, she is able to walk on tiptoe, heel, tandem walking   DIAGNOSTIC DATA (LABS, IMAGING, TESTING) - I reviewed patient records, labs, notes, testing and imaging myself where available.  ASSESSMENT AND PLAN  [name] is a 60 y.o. female   Right optic neuritis,  abnormal MRI of the brain   0 oligoclonal banding on spinal fluid testing,  Not enough evidence to support a definite diagnosis relapsing remitting multiple  sclerosis  We will repeat MRI of the brain with and without contrast in one year (last MRI of the brain October 2016)   Continue methotrexate Cervical spondylitic myelopathy  Anterior approach cervical decompression in February 2017  Left-sided low back pain, radicular pain to the left lower extremity  Evidence of lumbar degenerative disc disease on MRI  She has responded well to epidural injection     Hypertension  Continue Coreg 25 mg twice a day  Today I started lisinopril 10 mg daily  Marcial Pacas, M.D. Ph.D.  [facility] [address] [place], [place] [address] Ph: [phone] Fax: [phone]  CC: Katherina Mires, MD

## 2015-12-23 NOTE — Procedures (Signed)
   NCS (NERVE CONDUCTION STUDY) WITH EMG (ELECTROMYOGRAPHY) REPORT   STUDY DATE: December 23 2015 PATIENT NAME: [name] DOB: 15-Nov-1955 MRN: [id number]    TECHNOLOGIST: Laretta Alstrom ELECTROMYOGRAPHER: Marcial Pacas M.D.  CLINICAL INFORMATION:  60 years old female with left side low back pain, radiating pain to left lower extremity  FINDINGS: NERVE CONDUCTION STUDY: Bilateral peroneal sensory responses were normal. Bilateral peroneal to EDB, tibial motor responses were normal. Bilateral tibial H reflexes were normal and symmetric.  NEEDLE ELECTROMYOGRAPHY: Selective needle examinations were performed at bilateral lower extremity muscles, and bilateral lumbar sacral paraspinal muscles.  Needle examination of bilateral tibialis anterior, tibialis posterior, medial gastrocnemius, vastus lateralis, biceps femoris long head was normal.  There was no spontaneous activity at bilateral lumbar sacral paraspinal muscles, bilateral L4-5 S1.  IMPRESSION:   This is a normal study, there is no electrodiagnostic evidence of large fiber peripheral neuropathy or bilateral lumbar sacral radiculopathy.   INTERPRETING PHYSICIAN:   Marcial Pacas M.D. Ph.D. [facility] [address], [place], [place] [address] [phone]

## 2016-02-08 ENCOUNTER — Other Ambulatory Visit (HOSPITAL_COMMUNITY)
Admit: 2016-02-08 | Discharge: 2016-02-08 | Disposition: A | Discharge status: Home / Self Care | Payer: Medicare Other | Attending: Ophthalmology | Admitting: Ophthalmology

## 2016-02-08 DIAGNOSIS — H30033 Focal chorioretinal inflammation, peripheral, bilateral: Secondary | ICD-10-CM | POA: Insufficient documentation

## 2016-02-08 LAB — COMPREHENSIVE METABOLIC PANEL
ALBUMIN: 3.5 g/dL (ref 3.5–5.0)
ALT: 15 U/L (ref 14–54)
AST: 19 U/L (ref 15–41)
Alkaline Phosphatase: 52 U/L (ref 38–126)
Anion gap: 8 (ref 5–15)
BUN: 19 mg/dL (ref 6–20)
CHLORIDE: 106 mmol/L (ref 101–111)
CO2: 25 mmol/L (ref 22–32)
Calcium: 9.1 mg/dL (ref 8.9–10.3)
Creatinine, Ser: 0.84 mg/dL (ref 0.44–1.00)
GFR calc Af Amer: >60 mL/min (ref >60)
GLUCOSE: 133 mg/dL — AB (ref 65–99)
POTASSIUM: 3.7 mmol/L (ref 3.5–5.1)
SODIUM: 139 mmol/L (ref 135–145)
Total Bilirubin: 0.7 mg/dL (ref 0.3–1.2)
Total Protein: 6.3 g/dL — ABNORMAL LOW (ref 6.5–8.1)

## 2016-02-08 LAB — CBC WITH DIFFERENTIAL/PLATELET
BASOS ABS: 0 10*3/uL (ref 0.0–0.1)
BASOS PCT: 1 %
EOS ABS: 0.3 10*3/uL (ref 0.0–0.7)
EOS PCT: 4 %
HCT: 34.3 % — ABNORMAL LOW (ref 36.0–46.0)
Hemoglobin: 11.5 g/dL — ABNORMAL LOW (ref 12.0–15.0)
Lymphocytes Relative: 30 %
Lymphs Abs: 2 10*3/uL (ref 0.7–4.0)
MCH: 34 pg (ref 26.0–34.0)
MCHC: 33.5 g/dL (ref 30.0–36.0)
MCV: 101.5 fL — ABNORMAL HIGH (ref 78.0–100.0)
MONO ABS: 0.6 10*3/uL (ref 0.1–1.0)
Monocytes Relative: 9 %
Neutro Abs: 3.8 10*3/uL (ref 1.7–7.7)
Neutrophils Relative %: 56 %
PLATELETS: 190 10*3/uL (ref 150–400)
RBC: 3.38 MIL/uL — ABNORMAL LOW (ref 3.87–5.11)
RDW: 14.4 % (ref 11.5–15.5)
WBC: 6.7 10*3/uL (ref 4.0–10.5)

## 2016-02-09 ENCOUNTER — Other Ambulatory Visit: Payer: Self-pay | Admitting: Internal Medicine

## 2016-02-09 DIAGNOSIS — R0602 Shortness of breath: Secondary | ICD-10-CM

## 2016-02-09 LAB — MISC LABCORP TEST (SEND OUT): Labcorp test code: 80713

## 2016-02-09 LAB — PROTEIN ELECTROPHORESIS, SERUM
A/G Ratio: 1.2 (ref 0.7–1.7)
ALBUMIN ELP: 3.3 g/dL (ref 2.9–4.4)
ALPHA-1-GLOBULIN: 0.2 g/dL (ref 0.0–0.4)
Alpha-2-Globulin: 0.7 g/dL (ref 0.4–1.0)
BETA GLOBULIN: 1 g/dL (ref 0.7–1.3)
GAMMA GLOBULIN: 0.8 g/dL (ref 0.4–1.8)
Globulin, Total: 2.7 g/dL (ref 2.2–3.9)
Total Protein ELP: 6 g/dL (ref 6.0–8.5)

## 2016-02-09 LAB — HIV ANTIBODY (ROUTINE TESTING W REFLEX): HIV SCREEN 4TH GENERATION: NONREACTIVE

## 2016-02-09 LAB — RHEUMATOID FACTOR: Rhuematoid fact SerPl-aCnc: <10 IU/mL (ref 0.0–13.9)

## 2016-02-09 LAB — ANGIOTENSIN CONVERTING ENZYME

## 2016-02-09 LAB — HEPATITIS PANEL, ACUTE
Hep A IgM: NEGATIVE
Hep B C IgM: NEGATIVE
Hepatitis B Surface Ag: NEGATIVE

## 2016-02-09 LAB — ANTI-DNA ANTIBODY, DOUBLE-STRANDED

## 2016-02-09 LAB — CYCLIC CITRUL PEPTIDE ANTIBODY, IGG/IGA: CCP Antibodies IgG/IgA: 12 units (ref 0–19)

## 2016-02-09 LAB — ANTINUCLEAR ANTIBODIES, IFA: ANTINUCLEAR ANTIBODIES, IFA: NEGATIVE

## 2016-02-09 LAB — B. BURGDORFI ANTIBODIES: B burgdorferi Ab IgG+IgM: <0.91 {ISR} (ref 0.00–0.90)

## 2016-02-10 ENCOUNTER — Ambulatory Visit
Admission: RE | Admit: 2016-02-10 | Discharge: 2016-02-10 | Disposition: A | Discharge status: Home / Self Care | Payer: Medicare Other | Source: Ambulatory Visit | Attending: Internal Medicine | Admitting: Internal Medicine

## 2016-02-10 DIAGNOSIS — R0602 Shortness of breath: Secondary | ICD-10-CM

## 2016-02-10 LAB — QUANTIFERON IN TUBE
QFT TB AG MINUS NIL VALUE: 0.05 IU/mL
QUANTIFERON MITOGEN VALUE: 5.7 IU/mL
QUANTIFERON TB AG VALUE: 0.07 IU/mL
QUANTIFERON TB GOLD: NEGATIVE
Quantiferon Nil Value: 0.02 IU/mL

## 2016-02-10 LAB — QUANTIFERON TB GOLD ASSAY (BLOOD)

## 2016-02-10 LAB — FLUORESCENT TREPONEMAL AB(FTA)-IGG-BLD

## 2016-02-10 MED ORDER — IOPAMIDOL (ISOVUE-370) INJECTION 76%
80.0000 mL | Freq: Once | INTRAVENOUS | Status: AC | PRN
  Administered 2016-02-10: 80 mL via INTRAVENOUS

## 2016-02-11 LAB — RPR: RPR Ser Ql: NONREACTIVE

## 2016-02-14 LAB — MISC LABCORP TEST (SEND OUT): LABCORP TEST CODE: 510750

## 2016-02-15 LAB — HLA A AND B PHENOTYPE

## 2016-02-18 ENCOUNTER — Telehealth: Payer: Self-pay | Admitting: *Deleted

## 2016-02-18 NOTE — ED Notes (Signed)
[name] from [facility] Department verifying RPR results.

## 2016-03-09 ENCOUNTER — Other Ambulatory Visit: Payer: Self-pay | Admitting: Neurology

## 2016-03-24 ENCOUNTER — Encounter: Payer: Self-pay | Admitting: Cardiology

## 2016-03-24 ENCOUNTER — Ambulatory Visit (INDEPENDENT_AMBULATORY_CARE_PROVIDER_SITE_OTHER): Payer: Medicare Other | Admitting: Cardiology

## 2016-03-24 VITALS — BP 122/78 | HR 80 | Ht 66.0 in | Wt 200.8 lb

## 2016-03-24 DIAGNOSIS — R5382 Chronic fatigue, unspecified: Secondary | ICD-10-CM

## 2016-03-24 DIAGNOSIS — I2584 Coronary atherosclerosis due to calcified coronary lesion: Secondary | ICD-10-CM

## 2016-03-24 DIAGNOSIS — I2510 Atherosclerotic heart disease of native coronary artery without angina pectoris: Secondary | ICD-10-CM

## 2016-03-24 DIAGNOSIS — H469 Unspecified optic neuritis: Secondary | ICD-10-CM

## 2016-03-24 DIAGNOSIS — R0600 Dyspnea, unspecified: Secondary | ICD-10-CM

## 2016-03-24 NOTE — Progress Notes (Addendum)
Cardiology Office Note    Date:  03/24/2016   ID:  [name], DOB 11-Feb-1956, MRN [id number]  PCP:  Jerlyn Ly, MD  Cardiologist:   Candee Furbish, MD   Chief Complaint  Patient presents with  . New Patient (Initial Visit)    History of Present Illness:  [name] is a 60 y.o. female here for the evaluation of shortness of breath at the request of Dr. Joylene Draft.  CT scan on 02/10/16 was negative for PE but did show atherosclerosis including coronary artery disease/coronary calcification. BNP was 62.  Has had 2 epidural shots for back pain, has been wheezing, sleeping in a recliner, shortness of breath when walking. Patient's mother died at age 64 questionable MI, father died at age 50 from MI. She is a Pharmacist, hospital. She saw Dr. Caryl Comes once in [place]. Cardiology.  Has felt "so bad for so long". Out of breath. Fatigue. Now moves 10 min only and out of gas. Feels bad all of the time. Works only 3 hours a day. No specific chest pain.    Past Medical History  Diagnosis Date  . Retinal detachment   . Hypertension   . Arthritis   . Hypercholesteremia   . Retinal detachment   . Fibroid tumor   . Meniscus tear   . Spinal stenosis   . GERD (gastroesophageal reflux disease)   . Hepatitis     AS CHILD      Past Surgical History  Procedure Laterality Date  . Fibroid tumor    . Retinal detachment surgery Right   . Cataract extraction Right   . Knee surgery Bilateral   . Anterior cervical decomp/discectomy fusion N/A 11/02/2015    Procedure: ANTERIOR CERVICAL DECOMPRESSION FUSION CERVICAL FIVE-SIX,CERVICAL SIX-SEVEN;  Surgeon: Karie Chimera, MD;  Location: [facility];  Service: Neurosurgery;  Laterality: N/A;  right side approach    Current Medications: Outpatient Prescriptions Prior to Visit  Medication Sig Dispense Refill  . alendronate (FOSAMAX) 70 MG tablet Take 70 mg by mouth once a week.     Marland Kitchen CALCIUM PO Take 1 tablet by mouth daily.    . carvedilol (COREG) 25  MG tablet TAKE 1 TABLET (25 MG TOTAL) BY MOUTH 2 (TWO) TIMES DAILY WITH A MEAL. 60 tablet 1  . docusate sodium (COLACE) 100 MG capsule Take 100 mg by mouth daily as needed for mild constipation.    . folic acid (FOLVITE) 1 MG tablet Take 1 mg by mouth.    . gabapentin (NEURONTIN) 100 MG capsule Take 3 capsules (300 mg total) by mouth 3 (three) times daily. 270 capsule 3  . Glucos-Chond-Hyal Ac-Ca Fructo (MOVE FREE JOINT HEALTH ADVANCE PO) Take 1 tablet by mouth 2 (two) times daily.    Marland Kitchen lisinopril (ZESTRIL) 10 MG tablet Take 1 tablet (10 mg total) by mouth daily. 30 tablet 6  . pravastatin (PRAVACHOL) 20 MG tablet Take 20 mg by mouth daily.    . ranitidine (ZANTAC) 150 MG tablet Take 150 mg by mouth 2 (two) times daily as needed for heartburn.     . methotrexate (RHEUMATREX) 2.5 MG tablet Take 10 mg by mouth once a week.     Marland Kitchen oxyCODONE-acetaminophen (PERCOCET) 5-325 MG tablet Take 1 tablet by mouth every 4 (four) hours as needed for severe pain. 30 tablet 0   No facility-administered medications prior to visit.     Allergies:   Review of patient's allergies indicates no known allergies.   Social History  Social History  . Marital Status: Single    Spouse Name: N/A  . Number of Children: 0  . Years of Education: Masters   Occupational History  . caregiver     Part-time   Social History Main Topics  . Smoking status: Never Smoker   . Smokeless tobacco: None  . Alcohol Use: No  . Drug Use: No  . Sexual Activity: Not Asked   Other Topics Concern  . None   Social History Narrative   Lives at home with her sister.   Right-handed.   1 cup coffee daily.        Family History:  The patient's family history includes Arrhythmia in her mother; Bladder Cancer in her mother; Heart attack in her father; Heart disease in her father; Stroke in her maternal grandfather, maternal grandmother, and mother; Thyroid disease in her mother.   ROS:   Please see the history of present illness.     ROS All other systems reviewed and are negative.   PHYSICAL EXAM:   VS:  BP 122/78 mmHg  Pulse 80  Ht 5\' 6"  (1.676 m)  Wt 200 lb 12.8 oz (91.082 kg)  BMI 32.43 kg/m2  SpO2 97%   GEN: Well nourished, well developed, in no acute distress HEENT: normal Neck: no JVD, carotid bruits, or masses Cardiac: RRR; no murmurs, rubs, or gallops,no edema  Respiratory:  clear to auscultation bilaterally, normal work of breathing GI: soft, nontender, nondistended, + BS MS: no deformity or atrophy Skin: warm and dry, no rash Neuro:  Alert and Oriented x 3, Strength and sensation are intact Psych: euthymic mood, full affect  Wt Readings from Last 3 Encounters:  03/24/16 200 lb 12.8 oz (91.082 kg)  12/07/15 203 lb (92.08 kg)  11/02/15 200 lb (90.719 kg)      Studies/Labs Reviewed:   EKG: None today   Recent Labs: 02/08/2016: ALT 15; BUN 19; Creatinine, Ser 0.84; Hemoglobin 11.5*; Platelets 190; Potassium 3.7; Sodium 139   Lipid Panel No results found for: CHOL, TRIG, HDL, CHOLHDL, VLDL, LDLCALC, LDLDIRECT  Additional studies/ records that were reviewed today include:  Dr. Joylene Draft office note reviewed, lab work reviewed, CT scan reviewed.    ASSESSMENT:    1. Dyspnea   2. Coronary artery calcification   3. Chronic fatigue   4. Optic neuritis      PLAN:  In order of problems listed above:  Dyspnea, possible angina, coronary artery calcification seen on CT. Fatigue  - With her coronary artery calcification, age, father with myocardial infarction at age 45 I think would be reasonable to proceed with nuclear stress test, pharmacologic because of her underlying back pain to exclude any significant ischemia. I will also check an echocardiogram to ensure proper structure and function of her heart and make sure that her fatigue and excessive dyspnea is not related to an underlying cardiomyopathy.  - If normal, reassurance from a cardiac perspective.  - Of course, continue to encourage  conditioning, weight loss  Essential hypertension  - Under good control currently. Minor ankle edema she states from side effect of medication.  Chronic fatigue  - With her constellation of prior optic neuritis, fatigue could she have an underlying autoimmune condition?  - She also mentions that she had tick bites on her back for a while and had been treated a while back with doxycycline.?  - She also states that it was only approximately a week after the flu shot when she developed her optic neuritis  she wonders if there is a correlation and she is concerned about next year.  - Her TSH within the last year is normal, unsure when it is been most recently checked.  Coronary artery calcification  - Continue with statin use, blood pressure control.    Medication Adjustments/Labs and Tests Ordered: Current medicines are reviewed at length with the patient today.  Concerns regarding medicines are outlined above.  Medication changes, Labs and Tests ordered today are listed in the Patient Instructions below. Patient Instructions  Medication Instructions:  The current medical regimen is effective;  continue present plan and medications.  Testing/Procedures: Your physician has requested that you have an echocardiogram. Echocardiography is a painless test that uses sound waves to create images of your heart. It provides your doctor with information about the size and shape of your heart and how well your heart's chambers and valves are working. This procedure takes approximately one hour. There are no restrictions for this procedure.  Your physician has requested that you have a lexiscan myoview. For further information please visit [email or web address]. Please follow instruction sheet, as given.  Follow-Up: Further follow up will be based on the results of the above testing.  Thank you for choosing [facility]!!          Signed, Candee Furbish, MD  03/24/2016 2:29 PM    [address] [place], [place], [place]  [address] Phone: [phone]; Fax: [phone]

## 2016-03-24 NOTE — Patient Instructions (Signed)
Medication Instructions:  The current medical regimen is effective;  continue present plan and medications.  Testing/Procedures: Your physician has requested that you have an echocardiogram. Echocardiography is a painless test that uses sound waves to create images of your heart. It provides your doctor with information about the size and shape of your heart and how well your heart's chambers and valves are working. This procedure takes approximately one hour. There are no restrictions for this procedure.  Your physician has requested that you have a lexiscan myoview. For further information please visit [email or web address]. Please follow instruction sheet, as given.  Follow-Up: Further follow up will be based on the results of the above testing.  Thank you for choosing [facility]!!

## 2016-04-04 ENCOUNTER — Encounter: Payer: Self-pay | Admitting: Cardiology

## 2016-04-06 ENCOUNTER — Other Ambulatory Visit: Payer: Self-pay | Admitting: Neurology

## 2016-04-11 ENCOUNTER — Other Ambulatory Visit: Payer: Self-pay | Admitting: *Deleted

## 2016-04-11 MED ORDER — GABAPENTIN 100 MG PO CAPS: 300.0000 mg | ORAL_CAPSULE | Freq: Three times a day (TID) | ORAL | Status: DC

## 2016-04-18 ENCOUNTER — Telehealth (HOSPITAL_COMMUNITY): Payer: Self-pay | Admitting: *Deleted

## 2016-04-18 ENCOUNTER — Other Ambulatory Visit (HOSPITAL_COMMUNITY)
Admission: RE | Admit: 2016-04-18 | Discharge: 2016-04-18 | Disposition: A | Discharge status: Home / Self Care | Payer: Medicare Other | Source: Ambulatory Visit | Attending: Ophthalmology | Admitting: Ophthalmology

## 2016-04-18 DIAGNOSIS — Z5181 Encounter for therapeutic drug level monitoring: Secondary | ICD-10-CM | POA: Diagnosis present

## 2016-04-18 LAB — CBC WITH DIFFERENTIAL/PLATELET
BASOS ABS: 0 10*3/uL (ref 0.0–0.1)
Basophils Relative: 0 %
EOS ABS: 0.2 10*3/uL (ref 0.0–0.7)
EOS PCT: 2 %
HCT: 41.3 % (ref 36.0–46.0)
Hemoglobin: 13.2 g/dL (ref 12.0–15.0)
LYMPHS ABS: 2.4 10*3/uL (ref 0.7–4.0)
Lymphocytes Relative: 26 %
MCH: 32.3 pg (ref 26.0–34.0)
MCHC: 32 g/dL (ref 30.0–36.0)
MCV: 101 fL — ABNORMAL HIGH (ref 78.0–100.0)
MONO ABS: 0.8 10*3/uL (ref 0.1–1.0)
Monocytes Relative: 9 %
Neutro Abs: 5.7 10*3/uL (ref 1.7–7.7)
Neutrophils Relative %: 63 %
PLATELETS: 289 10*3/uL (ref 150–400)
RBC: 4.09 MIL/uL (ref 3.87–5.11)
RDW: 12.7 % (ref 11.5–15.5)
WBC: 9.2 10*3/uL (ref 4.0–10.5)

## 2016-04-18 LAB — COMPREHENSIVE METABOLIC PANEL
ALK PHOS: 57 U/L (ref 38–126)
ALT: 17 U/L (ref 14–54)
AST: 15 U/L (ref 15–41)
Albumin: 3.7 g/dL (ref 3.5–5.0)
Anion gap: 6 (ref 5–15)
BILIRUBIN TOTAL: 0.3 mg/dL (ref 0.3–1.2)
BUN: 27 mg/dL — AB (ref 6–20)
CO2: 26 mmol/L (ref 22–32)
CREATININE: 0.81 mg/dL (ref 0.44–1.00)
Calcium: 9.1 mg/dL (ref 8.9–10.3)
Chloride: 106 mmol/L (ref 101–111)
GFR calc Af Amer: >60 mL/min (ref >60)
GLUCOSE: 103 mg/dL — AB (ref 65–99)
Potassium: 4.2 mmol/L (ref 3.5–5.1)
Sodium: 138 mmol/L (ref 135–145)
TOTAL PROTEIN: 6.6 g/dL (ref 6.5–8.1)

## 2016-04-18 NOTE — Telephone Encounter (Signed)
Patient given detailed instructions per Myocardial Perfusion Study Information Sheet for the test on 04/19/16 at 1000. Patient notified to arrive 15 minutes early and that it is imperative to arrive on time for appointment to keep from having the test rescheduled.  If you need to cancel or reschedule your appointment, please call the office within 24 hours of your appointment. Failure to do so may result in a cancellation of your appointment, and a $50 no show fee. Patient verbalized understanding.Nelli Swalley, Ranae Palms

## 2016-04-19 ENCOUNTER — Other Ambulatory Visit (HOSPITAL_COMMUNITY): Payer: Self-pay

## 2016-04-19 ENCOUNTER — Ambulatory Visit (HOSPITAL_BASED_OUTPATIENT_CLINIC_OR_DEPARTMENT_OTHER): Payer: Medicare Other

## 2016-04-19 ENCOUNTER — Ambulatory Visit (HOSPITAL_COMMUNITY): Payer: Medicare Other | Attending: Cardiovascular Disease

## 2016-04-19 DIAGNOSIS — I2510 Atherosclerotic heart disease of native coronary artery without angina pectoris: Secondary | ICD-10-CM

## 2016-04-19 DIAGNOSIS — R0602 Shortness of breath: Secondary | ICD-10-CM | POA: Insufficient documentation

## 2016-04-19 DIAGNOSIS — I2584 Coronary atherosclerosis due to calcified coronary lesion: Secondary | ICD-10-CM

## 2016-04-19 DIAGNOSIS — R5383 Other fatigue: Secondary | ICD-10-CM | POA: Diagnosis not present

## 2016-04-19 DIAGNOSIS — E7800 Pure hypercholesterolemia, unspecified: Secondary | ICD-10-CM | POA: Diagnosis not present

## 2016-04-19 DIAGNOSIS — R0600 Dyspnea, unspecified: Secondary | ICD-10-CM

## 2016-04-19 DIAGNOSIS — R9439 Abnormal result of other cardiovascular function study: Secondary | ICD-10-CM | POA: Diagnosis not present

## 2016-04-19 DIAGNOSIS — I119 Hypertensive heart disease without heart failure: Secondary | ICD-10-CM | POA: Diagnosis not present

## 2016-04-19 LAB — MYOCARDIAL PERFUSION IMAGING
CHL CUP NUCLEAR SDS: 4
CHL CUP RESTING HR STRESS: 63 {beats}/min
LHR: 0.17
LVDIAVOL: 84 mL (ref 46–106)
LVSYSVOL: 30 mL
NUC STRESS TID: 1.12
Peak HR: 85 {beats}/min
SRS: 2
SSS: 6

## 2016-04-19 LAB — ECHOCARDIOGRAM COMPLETE
HEIGHTINCHES: 66 in
WEIGHTICAEL: 3200 [oz_av]

## 2016-04-19 MED ORDER — TECHNETIUM TC 99M TETROFOSMIN IV KIT
31.2000 | PACK | Freq: Once | INTRAVENOUS | Status: AC | PRN
  Administered 2016-04-19: 31.2 via INTRAVENOUS
  Filled 2016-04-19: qty 31

## 2016-04-19 MED ORDER — TECHNETIUM TC 99M TETROFOSMIN IV KIT
10.4000 | PACK | Freq: Once | INTRAVENOUS | Status: AC | PRN
  Administered 2016-04-19: 10 via INTRAVENOUS
  Filled 2016-04-19: qty 10

## 2016-04-19 MED ORDER — REGADENOSON 0.4 MG/5ML IV SOLN
0.4000 mg | Freq: Once | INTRAVENOUS | Status: AC
  Administered 2016-04-19: 0.4 mg via INTRAVENOUS

## 2016-04-20 ENCOUNTER — Ambulatory Visit: Payer: Medicare Other | Admitting: Neurology

## 2016-05-15 ENCOUNTER — Other Ambulatory Visit: Payer: Self-pay | Admitting: Neurology

## 2016-05-16 ENCOUNTER — Encounter: Payer: Self-pay | Admitting: Neurology

## 2016-05-16 ENCOUNTER — Ambulatory Visit (INDEPENDENT_AMBULATORY_CARE_PROVIDER_SITE_OTHER): Payer: Medicare Other | Admitting: Neurology

## 2016-05-16 VITALS — BP 159/93 | HR 78 | Ht 66.0 in | Wt 198.0 lb

## 2016-05-16 DIAGNOSIS — H469 Unspecified optic neuritis: Secondary | ICD-10-CM | POA: Diagnosis not present

## 2016-05-16 DIAGNOSIS — M5416 Radiculopathy, lumbar region: Secondary | ICD-10-CM

## 2016-05-16 DIAGNOSIS — M47812 Spondylosis without myelopathy or radiculopathy, cervical region: Secondary | ICD-10-CM | POA: Diagnosis not present

## 2016-05-16 DIAGNOSIS — R413 Other amnesia: Secondary | ICD-10-CM

## 2016-05-16 MED ORDER — AZATHIOPRINE 50 MG PO TABS: 50.0000 mg | ORAL_TABLET | Freq: Every day | ORAL | 4 refills | Status: AC

## 2016-05-16 NOTE — Progress Notes (Signed)
Chief Complaint  Patient presents with  . Right Optic Neuritis    She is still having blurred vision and could not complete an eye exam today.  She would like to discuss gabapentin. She stopped taking the medication because she felt it was contributing to her vision difficulty. Says she is scheduled for cataract surgery 06/08/16.  . Back Pain    She is gettting epidural steroid injections for pain management, which is working well for her.  . Cervical pain    She is doing better since her surgery in February 2017.  . Memory Loss    MMSE 30/30 - 18 animals.  She is concerned about a decline in her memory and word finding difficulty.      PATIENT: [name] DOB: 04-Feb-1956  Chief Complaint  Patient presents with  . Visual Disturbance    She has been evaluated at [facility] by Dr. Dwana Melena.  She was informed it was possible optic neuritis.     HISTORICAL  [name] is a 60 years old right-handed female, seen in refer by her primary care physician, for evaluation of right visual distortion, possible right optic neuritis in July 22 2015.  She had a history of right retinal detachment in 2002, in early October 2016, she noticed blurry vision, color washing out, by July 10 2015, she realized when she close her left eye, she saw dark clusters of gray discoloration in her right visual field,  She was sent to the emergency room for evaluation, was diagnosed with right optic neuritis, stayed in hospital for IV Solu-Medrol 1000 mg for 4 days.  She reported over the past few years, she had intermittent left hand numbness, muscle cramping, but was never evaluated, she also complains bilateral lower extremity stiffness, give out underneath her sometimes. 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  She was also recently evaluated by [facility] ophthalmologist Dr. Dwana Melena, confirmed her diagnosis of  right optic neuritis , she also has a history of bilateral posterior vitreous detachment, hypertensive retinopathy of both eyes, she was given prednisone tapering dose, overall her vision has improved, but not back to her baseline  I have personally reviewed MRI of the brain with and without contrast October 2015 with patient, multi supratentorium lesions, few lesions are oval-shaped, oriented towards ventricle, no contrast enhancement. This certainly raises the possibility of relapsing remitting multiple sclerosis versus small vessel disease.  She also reported previous history of brain injury, basketball bad hit her head before, falling down 13 steps stairs  in the past  Laboratory showed mild elevated glucose on BMP, normal CBC, Lyme titer, ESR was elevated 35  UPDATE Aug 10 2015:  I have personally reviewed MRI of the cervical in November third 2016 with patient: Severe spinal stenosis at C5-C6 with an AP diameter of 5.6 mm. There is subtle T2 hyperintense signal within the spinal cord below the point of maximal stenosis that could represent myelopathic changes associated with possible cord compression. Mild spinal stenosis at C6-C7 that does not lead to any spinal cord changes or nerve root compression.  Patient's right eye vision has significant improvement, about 80% back to baseline.  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 She also complains of nighttime snoring, excessive daytime  sleepiness, fatigue during the daytime, ESS score is 10  UPDATE Oct 19 2015: She has developed moon face, she is now taking prednisone 62m, [facility], she was given prednisone 62 mg daily since July 19 2015, only started tapering recently, she was also started on methotrexate 2.5 milligrams, 4  tablets once a week, along with folic acid since October 12 2015, She is now on Fosamax,  She continue complains of intermittent blurry vision involving both eye, she has complicated eye issues, bilateral cataract, posterior vitreous detachment, hypertensive retinopathy, right optic neuritis, right retinal detachment, status post scleral buckle/cryo in the past, laboratory evaluation in her January visit showed no evidence of systemic inflammatory process  She will have anterior approach cervical decompression surgery in November 02 2015.  She is now having increased pain at her both hips, she also has low back pain, getting worse since Jan 2017, she has mild gait difficulty also related to her bilateral hip pain, she denies bowel and bladder incontinence, generalized fatigue, even bilateral upper extremity weighted down  I have reviewed ophthalmologist note and called her ophthalmologist Dr. SManuella Ghazi we agreed on quickly tapering off her steroid dosage, keep methotrexate 2.4 milligrams 4 tablets each week   Update December 07 2015: She had anterior cervical approach decompression and fusion of C5-6, C6-7 in November 02 2015, she complained of diffuse body achy pain, shortness of breath with minimum exertion  She has been complaining of worsening low back pain since January 2017, used to radiate to both hip, lower extremity, right leg has much improved, but she still has significant left hip pain, especially after bearing weight, she has increased nocturia,  We have personally reviewed x-ray of right and left hip, there was no significant pathology.  MRI of lumbar February 2017: Multilevel degenerative changes, most severe at L4-5: disc bulging and facet hypertrophy with moderate-severe spinal stenosis and mild biforaminal narrowing. L3-4: disc bulging and facet hypertrophy with mild-moderate spinal stenosis and no foraminal narrowing. Additional multi-level facet hypertrophy and disc bulging as above.  Compared to MRI on 11/08/10, there has been some progressive worsening of degenerative spine disease and spinal stenosis.  The MRI findings, especially moderate to severe L4-5 spinal canal stenosis, and moderate to severe bilateral foraminal stenosis explains her complains of worsening low back pain, radiating pain to left lower extremity and gait difficulty, she is scheduled to have epidural injection in next few days   UPDATE May 16 2016: She is scheduled to have left cataract in June 08 2016,  She recovered well from cervical decompression surgery in February, no longer has significant neck pain, her low back pain is well controlled by epidural injection, last injection was around May 2017,  She is concerned about her memory loss, word finding difficulty, forgot to clock in for her work, got confused while driving.  I personally reviewed CT head in Oct 2016, that was normal.  She has stopped taking gabapentin for concerning of blurry vision, it is hard for her to drive,  She reported normal laboratory evaluations by her primary care doctor Dr. PAbner Greenspanincluding B12, TSH. Her mother suffered memory loss in her elderly age at age [age]69 her 2 maternal aunt suffered Alzheimer's disease.    REVIEW OF SYSTEMS: Full 14 system review of systems performed and notable only for: Fatigue, runny nose, blurred vision, memory loss  ALLERGIES: No Known Allergies  HOME MEDICATIONS: Current Outpatient Prescriptions  Medication Sig Dispense Refill  . alendronate (FOSAMAX) 70 MG  tablet Take 70 mg by mouth once a week.     Marland Kitchen aspirin EC 81 MG tablet Take 1 tablet by mouth daily.    Marland Kitchen azaTHIOprine (IMURAN) 50 MG tablet Take 50 mg by mouth daily.    Marland Kitchen CALCIUM PO Take 1 tablet by mouth daily.    . carvedilol (COREG) 25 MG tablet TAKE 1 TABLET (25 MG TOTAL) BY MOUTH 2 (TWO) TIMES DAILY WITH A MEAL. 60 tablet 1  . docusate sodium (COLACE) 100 MG capsule Take 100 mg by mouth daily as needed for mild  constipation.    . folic acid (FOLVITE) 1 MG tablet Take 1 mg by mouth.    . gabapentin (NEURONTIN) 100 MG capsule Take 3 capsules (300 mg total) by mouth 3 (three) times daily. 270 capsule 11  . Glucos-Chond-Hyal Ac-Ca Fructo (MOVE FREE JOINT HEALTH ADVANCE PO) Take 1 tablet by mouth 2 (two) times daily.    Marland Kitchen lisinopril (ZESTRIL) 10 MG tablet Take 1 tablet (10 mg total) by mouth daily. 30 tablet 6  . nitroGLYCERIN (NITROSTAT) 0.4 MG SL tablet Place 0.4 mg under the tongue as directed.    Marland Kitchen omeprazole (PRILOSEC) 20 MG capsule Take 1 capsule by mouth daily.  8  . pravastatin (PRAVACHOL) 20 MG tablet Take 20 mg by mouth daily.    . ranitidine (ZANTAC) 150 MG tablet Take 150 mg by mouth 2 (two) times daily as needed for heartburn.      No current facility-administered medications for this visit.     PAST MEDICAL HISTORY: Past Medical History:  Diagnosis Date  . Arthritis   . Deviated septum   . Fibroid tumor   . GERD (gastroesophageal reflux disease)   . Hepatitis    AS CHILD    . Hypercholesteremia   . Hypertension   . Memory loss   . Meniscus tear   . Retinal detachment   . Retinal detachment   . Spinal stenosis     PAST SURGICAL HISTORY: Past Surgical History:  Procedure Laterality Date  . ANTERIOR CERVICAL DECOMP/DISCECTOMY FUSION N/A 11/02/2015   Procedure: ANTERIOR CERVICAL DECOMPRESSION FUSION CERVICAL FIVE-SIX,CERVICAL SIX-SEVEN;  Surgeon: Karie Chimera, MD;  Location: [facility];  Service: Neurosurgery;  Laterality: N/A;  right side approach  . CATARACT EXTRACTION Right   . fibroid tumor    . KNEE SURGERY Bilateral   . RETINAL DETACHMENT SURGERY Right     FAMILY HISTORY: Family History  Problem Relation Age of Onset  . Stroke Mother   . Arrhythmia Mother   . Bladder Cancer Mother   . Thyroid disease Mother   . Heart disease Father   . Heart attack Father   . Stroke Maternal Grandfather   . Stroke Maternal Grandmother     SOCIAL HISTORY:  Social History     Social History  . Marital status: Single    Spouse name: N/A  . Number of children: 0  . Years of education: Masters   Occupational History  . caregiver     Part-time   Social History Main Topics  . Smoking status: Never Smoker  . Smokeless tobacco: Not on file  . Alcohol use No  . Drug use: No  . Sexual activity: Not on file   Other Topics Concern  . Not on file   Social History Narrative   Lives at home with her sister.   Right-handed.   1 cup coffee daily.        PHYSICAL EXAM   Vitals:  05/16/16 1334  BP: (!) 159/93  Pulse: 78  Weight: 198 lb (89.8 kg)  Height: 5' 6"  (1.676 m)    Not recorded      Body mass index is 31.96 kg/m.  PHYSICAL EXAMNIATION:  Gen: NAD, conversant, well nourised, obese, well groomed                     Cardiovascular: Regular rate rhythm, no peripheral edema, warm, nontender. Eyes: Conjunctivae clear without exudates or hemorrhage Neck: Supple, no carotid bruise. Pulmonary: Clear to auscultation bilaterally   NEUROLOGICAL EXAM:  MENTAL STATUS: Speech:    Speech is normal; fluent and spontaneous with normal comprehension.  Cognition:Mini-Mental Status Examination 30/30, animal naming 28     Orientation to time, place and person     Normal recent and remote memory     Normal Attention span and concentration     Normal Language, naming, repeating,spontaneous speech     Fund of knowledge   CRANIAL NERVES: CN II: Visual fields are full to confrontation. Fundoscopic exam showed mild blurry edge at bilateral fundi. Pupils are round briskly reactive to light, near vision are 20/20 OU CN III, IV, VI: extraocular movement are normal. No ptosis. CN V: Facial sensation is intact to pinprick in all 3 divisions bilaterally. Corneal responses are intact.  CN VII: Face is symmetric with normal eye closure and smile. CN VIII: Hearing is normal to rubbing fingers CN IX, X: Palate elevates symmetrically. Phonation is normal. CN XI:  Head turning and shoulder shrug are intact CN XII: Tongue is midline with normal movements and no atrophy.  MOTOR: There is no pronator drift of out-stretched arms. Muscle bulk and tone are normal. She has mild bilateral hip flexion weakness  REFLEXES: Reflexes are 2+ and symmetric at the biceps, triceps, knees 2/0 , absent at ankles. Plantar responses are flexor.  SENSORY: Length dependent decreased to light touch, pinprick, position sense, and vibration sense are intact in fingers and toes.  COORDINATION: Rapid alternating movements and fine finger movements are intact. There is no dysmetria on finger-to-nose and heel-knee-shin.    GAIT/STANCE: Mildly unsteady, difficulty perform tandem walking   DIAGNOSTIC DATA (LABS, IMAGING, TESTING) - I reviewed patient records, labs, notes, testing and imaging myself where available.  ASSESSMENT AND PLAN  FRANSHESKA WILLINGHAM is a 60 y.o. female   Right optic neuritis,  abnormal MRI of the brain   0 oligoclonal banding on spinal fluid testing,  Not enough evidence to support a definite diagnosis relapsing remitting multiple sclerosis  She was treated with steroid since October 2016, had a phone discussion with her ophthalmologist Dr. Manuella Ghazi, agrees on tapering off her prednisone to avoid the long-term side effe, she is now on low-dose Imuran 50 mg a day, there was no significant change in her visual acuity  Cervical spondylitic myelopathy  Anterior approach cervical decompression in February 2017, she can walk better now, no significant neck pain  Bilateral lower extremity pain   most consistent with lumbar radiculopathy, there was no evidence of active bilateral lumbosacral radiculopathy March 2017  Could not tolerate gabapentin due to worsening blurry vision  Moderate exercise  Her low back pain has improved with epidural injection  Short-term memory loss  Mini-Mental Status Examination is 30/30 today,  Family history of dementia  She had  small chin, narrow oropharyngeal space, at high risk for obstructive sleep apnea,  May consider sleep study refer    Marcial Pacas, M.D. Ph.D.  [facility] [address] [place], [place] [address] Ph: [phone] Fax: [phone]  CC: Katherina Mires, MD

## 2016-05-23 ENCOUNTER — Other Ambulatory Visit: Payer: Self-pay | Admitting: Internal Medicine

## 2016-05-23 DIAGNOSIS — Z1231 Encounter for screening mammogram for malignant neoplasm of breast: Secondary | ICD-10-CM

## 2016-06-22 DIAGNOSIS — M899 Disorder of bone, unspecified: Secondary | ICD-10-CM | POA: Insufficient documentation

## 2016-06-27 ENCOUNTER — Other Ambulatory Visit (HOSPITAL_COMMUNITY)
Admission: RE | Admit: 2016-06-27 | Discharge: 2016-06-27 | Disposition: A | Discharge status: Home / Self Care | Payer: Medicare Other | Source: Ambulatory Visit | Attending: Ophthalmology | Admitting: Ophthalmology

## 2016-06-27 DIAGNOSIS — H30033 Focal chorioretinal inflammation, peripheral, bilateral: Secondary | ICD-10-CM | POA: Insufficient documentation

## 2016-06-27 LAB — CBC WITH DIFFERENTIAL/PLATELET
Basophils Absolute: 0.1 10*3/uL (ref 0.0–0.1)
Basophils Relative: 1 %
EOS ABS: 0.4 10*3/uL (ref 0.0–0.7)
EOS PCT: 6 %
HCT: 40.8 % (ref 36.0–46.0)
Hemoglobin: 13.3 g/dL (ref 12.0–15.0)
LYMPHS ABS: 2.1 10*3/uL (ref 0.7–4.0)
Lymphocytes Relative: 30 %
MCH: 31.8 pg (ref 26.0–34.0)
MCHC: 32.6 g/dL (ref 30.0–36.0)
MCV: 97.6 fL (ref 78.0–100.0)
MONO ABS: 0.5 10*3/uL (ref 0.1–1.0)
Monocytes Relative: 8 %
Neutro Abs: 3.8 10*3/uL (ref 1.7–7.7)
Neutrophils Relative %: 55 %
PLATELETS: 230 10*3/uL (ref 150–400)
RBC: 4.18 MIL/uL (ref 3.87–5.11)
RDW: 13.7 % (ref 11.5–15.5)
WBC: 6.9 10*3/uL (ref 4.0–10.5)

## 2016-06-27 LAB — COMPREHENSIVE METABOLIC PANEL
ALT: 14 U/L (ref 14–54)
ANION GAP: 5 (ref 5–15)
AST: 17 U/L (ref 15–41)
Albumin: 3.8 g/dL (ref 3.5–5.0)
Alkaline Phosphatase: 45 U/L (ref 38–126)
BUN: 17 mg/dL (ref 6–20)
CHLORIDE: 107 mmol/L (ref 101–111)
CO2: 29 mmol/L (ref 22–32)
Calcium: 9.8 mg/dL (ref 8.9–10.3)
Creatinine, Ser: 0.78 mg/dL (ref 0.44–1.00)
GFR calc non Af Amer: >60 mL/min (ref >60)
Glucose, Bld: 111 mg/dL — ABNORMAL HIGH (ref 65–99)
POTASSIUM: 4.4 mmol/L (ref 3.5–5.1)
SODIUM: 141 mmol/L (ref 135–145)
Total Bilirubin: 0.7 mg/dL (ref 0.3–1.2)
Total Protein: 6.6 g/dL (ref 6.5–8.1)

## 2016-06-28 ENCOUNTER — Ambulatory Visit
Admission: RE | Admit: 2016-06-28 | Discharge: 2016-06-28 | Disposition: A | Discharge status: Home / Self Care | Payer: Medicare Other | Source: Ambulatory Visit | Attending: Internal Medicine | Admitting: Internal Medicine

## 2016-06-28 DIAGNOSIS — Z1231 Encounter for screening mammogram for malignant neoplasm of breast: Secondary | ICD-10-CM

## 2016-07-05 ENCOUNTER — Other Ambulatory Visit: Payer: Self-pay | Admitting: Neurology

## 2016-08-15 DIAGNOSIS — H43813 Vitreous degeneration, bilateral: Secondary | ICD-10-CM | POA: Insufficient documentation

## 2016-11-20 ENCOUNTER — Ambulatory Visit (INDEPENDENT_AMBULATORY_CARE_PROVIDER_SITE_OTHER): Payer: Medicare Other | Admitting: Adult Health

## 2016-11-20 ENCOUNTER — Encounter: Payer: Self-pay | Admitting: Adult Health

## 2016-11-20 VITALS — BP 127/77 | HR 76 | Ht 66.0 in | Wt 206.0 lb

## 2016-11-20 DIAGNOSIS — H469 Unspecified optic neuritis: Secondary | ICD-10-CM | POA: Diagnosis not present

## 2016-11-20 DIAGNOSIS — R413 Other amnesia: Secondary | ICD-10-CM

## 2016-11-20 DIAGNOSIS — M5416 Radiculopathy, lumbar region: Secondary | ICD-10-CM

## 2016-11-20 NOTE — Patient Instructions (Signed)
Memory score stable- refer for neuropsychological testing Will wait to repeat MRI pending eye procedure If your symptoms worsen or you develop new symptoms please let us know.

## 2016-11-20 NOTE — Progress Notes (Signed)
  PATIENT: [name] DOB: 04/06/1956  REASON FOR VISIT: follow up- memory, optic neuritis, radiculopathy HISTORY FROM: patient  HISTORY OF PRESENT ILLNESS: HISTORY per Dr. Yan notes: [name] is a 61 years old right-handed female, seen in refer by her primary care physician, for evaluation of right visual distortion, possible right optic neuritis in July 22 2015.  She had a history of right retinal detachment in 2002, in early October 2016, she noticed blurry vision, color washing out, by July 10 2015, she realized when she close her left eye, she saw dark clusters of gray discoloration in her right visual field,  She was sent to the emergency room for evaluation, was diagnosed with right optic neuritis, stayed in hospital for IV Solu-Medrol 1000 mg for 4 days.  She reported over the past few years, she had intermittent left hand numbness, muscle cramping, but was never evaluated, she also complains bilateral lower extremity stiffness, give out underneath her sometimes. Extreme fatigue, she used to be active, retired schoolteacher, now she has difficulty mowing her yard because her severe fatigue.  She was also recently evaluated by [facility]mologist Dr. Rajiv Shah, confirmed her diagnosis of right optic neuritis , she also has a history of bilateral posterior vitreous detachment, hypertensive retinopathy of both eyes, she was given prednisone tapering dose, overall her vision has improved, but not back to her baseline  I have personally reviewed MRI of the brain with and without contrast October 2015 with patient, multi supratentorium lesions, few lesions are oval-shaped, oriented towards ventricle, no contrast enhancement. This certainly raises the possibility of relapsing remitting multiple sclerosis versus small vessel disease.  She also reported previous history of brain injury, basketball bad hit her head before, falling down 13 steps stairs  in the  past  Laboratory showed mild elevated glucose on BMP, normal CBC, Lyme titer, ESR was elevated 35  UPDATE Aug 10 2015:  I have personally reviewed MRI of the cervical in November third 2016 with patient: Severe spinal stenosis at C5-C6 with an AP diameter of 5.6 mm. There is subtle T2 hyperintense signal within the spinal cord below the point of maximal stenosis that could represent myelopathic changes associated with possible cord compression. Mild spinal stenosis at C6-C7 that does not lead to any spinal cord changes or nerve root compression.  Patient's right eye vision has significant improvement, about 80% back to baseline.  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 She also complains of nighttime snoring, excessive daytime sleepiness, fatigue during the daytime, ESS score is 10  UPDATE Oct 19 2015: She has developed moon face, she is now taking prednisone 30mg, prescribed by [facility] ophthalmologist Dr.Shah, she was given prednisone 60 mg daily since July 19 2015, only started tapering recently, she was also started on methotrexate 2.5 milligrams, 4 tablets once a week, along with folic acid since October 12 2015, She is now on Fosamax,  She continue complains of intermittent blurry vision involving both eye, she has complicated eye issues, bilateral cataract, posterior vitreous detachment, hypertensive retinopathy, right optic neuritis, right retinal detachment, status post scleral buckle/cryo in the past, laboratory evaluation in her January visit showed no evidence of systemic inflammatory process  She will have anterior approach cervical decompression surgery in November 02 2015.  She is now having increased pain at her   both hips, she also has low back pain, getting worse since Jan 2017, she has mild  gait difficulty also related to her bilateral hip pain, she denies bowel and bladder incontinence, generalized fatigue, even bilateral upper extremity weighted down  I have reviewed ophthalmologist note and called her ophthalmologist Dr. Manuella Ghazi, we agreed on quickly tapering off her steroid dosage, keep methotrexate 2.4 milligrams 4 tablets each week   Update December 07 2015: She had anterior cervical approach decompression and fusion of C5-6, C6-7 in November 02 2015, she complained of diffuse body achy pain, shortness of breath with minimum exertion  She has been complaining of worsening low back pain since January 2017, used to radiate to both hip, lower extremity, right leg has much improved, but she still has significant left hip pain, especially after bearing weight, she has increased nocturia,  We have personally reviewed x-ray of right and left hip, there was no significant pathology.  MRI of lumbar February 2017: Multilevel degenerative changes, most severe at L4-5: disc bulging and facet hypertrophy with moderate-severe spinal stenosis and mild biforaminal narrowing. L3-4: disc bulging and facet hypertrophy with mild-moderate spinal stenosis and no foraminal narrowing. Additional multi-level facet hypertrophy and disc bulging as above. Compared to MRI on 11/08/10, there has been some progressive worsening of degenerative spine disease and spinal stenosis.  The MRI findings, especially moderate to severe L4-5 spinal canal stenosis, and moderate to severe bilateral foraminal stenosis explains her complains of worsening low back pain, radiating pain to left lower extremity and gait difficulty, she is scheduled to have epidural injection in next few days   UPDATE May 16 2016: She is scheduled to have left cataract in June 08 2016,  She recovered well from cervical decompression surgery in February, no longer has significant neck pain, her low back pain is well controlled by epidural  injection, last injection was around May 2017,  She is concerned about her memory loss, word finding difficulty, forgot to clock in for her work, got confused while driving.  I personally reviewed CT head in Oct 2016, that was normal.  She has stopped taking gabapentin for concerning of blurry vision, it is hard for her to drive,  She reported normal laboratory evaluations by her primary care doctor Dr. Abner Greenspan including B12, TSH. Her mother suffered memory loss in her elderly age at age 50, her 2 maternal aunt suffered Alzheimer's disease.    Today November 20 2016:  [name] is a 61 year old female with a history of right optic neuritis, lumbar radiculopathy and memory disturbance. She returns today for an evaluation. She is currently on Imuran for optic neuritis. She reports that her vision has worsened on the right. She did see Dr. Manuella Ghazi and her Imuran was increased to 50 mg twice a day. She saw Dr. Katy Fitch last week and he feels that the change in her vision may be due to a previous cataract removal. She reports that she is having surgery March 7 with Dr. Katy Fitch to fix the cataract film. She reports that her memory is stable. She reports that she continues to have trouble concentrating and focusing on things. 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 She states that she is always losing objects and typically her sister has help her find them. She lives with her sister. She is able to operate a motor vehicle without difficulty. She does report that there have been occasions where she may be "daydreaming" and miss her  turns. Denies any trouble cooking. Reports that she sleeps well. Denies hallucinations. She is able to able all finances without difficulty. She continues to get epidural steroid injections for lumbar radiculopathy. She returns today for an evaluation.   REVIEW OF SYSTEMS: Out of a complete 14 system review of symptoms, the patient  complains only of the following symptoms, and all other reviewed systems are negative.  Loss of vision, blurred vision, weight change, leg swelling, joint pain, back pain, walking difficulty, weakness, memory loss  ALLERGIES: No Known Allergies  HOME MEDICATIONS: Outpatient Medications Prior to Visit  Medication Sig Dispense Refill  . aspirin EC 81 MG tablet Take 1 tablet by mouth daily.    Marland Kitchen azaTHIOprine (IMURAN) 50 MG tablet Take 1 tablet (50 mg total) by mouth daily. (Patient taking differently: Take 50 mg by mouth 2 (two) times daily. ) 90 tablet 4  . CALCIUM PO Take 1 tablet by mouth daily.    . carvedilol (COREG) 25 MG tablet TAKE 1 TABLET (25 MG TOTAL) BY MOUTH 2 (TWO) TIMES DAILY WITH A MEAL. 60 tablet 5  . docusate sodium (COLACE) 100 MG capsule Take 100 mg by mouth daily as needed for mild constipation.    . folic acid (FOLVITE) 1 MG tablet Take 1 mg by mouth.    . Glucos-Chond-Hyal Ac-Ca Fructo (MOVE FREE JOINT HEALTH ADVANCE PO) Take 1 tablet by mouth 2 (two) times daily.    Marland Kitchen lisinopril (PRINIVIL,ZESTRIL) 10 MG tablet TAKE 1 TABLET (10 MG TOTAL) BY MOUTH DAILY. 30 tablet 5  . nitroGLYCERIN (NITROSTAT) 0.4 MG SL tablet Place 0.4 mg under the tongue as directed.    Marland Kitchen omeprazole (PRILOSEC) 20 MG capsule Take 1 capsule by mouth daily.  8  . ranitidine (ZANTAC) 150 MG tablet Take 150 mg by mouth 2 (two) times daily as needed for heartburn.     . pravastatin (PRAVACHOL) 20 MG tablet Take 20 mg by mouth daily.    Marland Kitchen alendronate (FOSAMAX) 70 MG tablet Take 70 mg by mouth once a week.      No facility-administered medications prior to visit.     PAST MEDICAL HISTORY: Past Medical History:  Diagnosis Date  . Arthritis   . Deviated septum   . Fibroid tumor   . GERD (gastroesophageal reflux disease)   . Hepatitis    AS CHILD    . Hypercholesteremia   . Hypertension   . Memory loss   . Meniscus tear   . Retinal detachment   . Retinal detachment   . Spinal stenosis     PAST  SURGICAL HISTORY: Past Surgical History:  Procedure Laterality Date  . ANTERIOR CERVICAL DECOMP/DISCECTOMY FUSION N/A 11/02/2015   Procedure: ANTERIOR CERVICAL DECOMPRESSION FUSION CERVICAL FIVE-SIX,CERVICAL SIX-SEVEN;  Surgeon: Karie Chimera, MD;  Location: [facility];  Service: Neurosurgery;  Laterality: N/A;  right side approach  . CATARACT EXTRACTION Right   . fibroid tumor    . KNEE SURGERY Bilateral   . RETINAL DETACHMENT SURGERY Right     FAMILY HISTORY: Family History  Problem Relation Age of Onset  . Stroke Mother   . Arrhythmia Mother   . Bladder Cancer Mother   . Thyroid disease Mother   . Heart disease Father   . Heart attack Father   . Stroke Maternal Grandfather   . Stroke Maternal Grandmother     SOCIAL HISTORY: Social History   Social History  . Marital status: Single    Spouse name: N/A  . Number  of children: 0  . Years of education: Masters   Occupational History  . caregiver     Part-time   Social History Main Topics  . Smoking status: Never Smoker  . Smokeless tobacco: Never Used  . Alcohol use No  . Drug use: No  . Sexual activity: Not on file   Other Topics Concern  . Not on file   Social History Narrative   Lives at home with her sister.   Right-handed.   1 cup coffee daily.         PHYSICAL EXAM  Vitals:   11/20/16 1356  Weight: 206 lb (93.4 kg)  Height: '5\' 6"'$  (1.676 m)   Body mass index is 33.25 kg/m.  Generalized: Well developed, in no acute distress   Neurological examination  Mentation: Alert oriented to time, place, history taking. Follows all commands speech and language fluent Cranial nerve II-XII: Pupils were equal round reactive to light. Extraocular movements were full, visual field were full on confrontational test. Facial sensation and strength were normal. Uvula tongue midline. Head turning and shoulder shrug  were normal and symmetric. Motor: The motor testing reveals 5 over 5 strength of all 4 extremities.  Good symmetric motor tone is noted throughout.  Sensory: Sensory testing is intact to soft touch on all 4 extremities. No evidence of extinction is noted.  Coordination: Cerebellar testing reveals good finger-nose-finger and heel-to-shin bilaterally.  Gait and station: Gait is slightly unsteady. Tandem gait is not attempted. Romberg is negative. Reflexes: Deep tendon reflexes are symmetric and normal bilaterally.   DIAGNOSTIC DATA (LABS, IMAGING, TESTING) - I reviewed patient records, labs, notes, testing and imaging myself where available.  Lab Results  Component Value Date   WBC 6.9 06/27/2016   HGB 13.3 06/27/2016   HCT 40.8 06/27/2016   MCV 97.6 06/27/2016   PLT 230 06/27/2016      Component Value Date/Time   NA 141 06/27/2016 1228   K 4.4 06/27/2016 1228   CL 107 06/27/2016 1228   CO2 29 06/27/2016 1228   GLUCOSE 111 (H) 06/27/2016 1228   BUN 17 06/27/2016 1228   CREATININE 0.78 06/27/2016 1228   CALCIUM 9.8 06/27/2016 1228   PROT 6.6 06/27/2016 1228   ALBUMIN 3.8 06/27/2016 1228   AST 17 06/27/2016 1228   ALT 14 06/27/2016 1228   ALKPHOS 45 06/27/2016 1228   BILITOT 0.7 06/27/2016 1228   GFRNONAA >60 06/27/2016 1228   GFRAA >60 06/27/2016 1228      ASSESSMENT AND PLAN 61 y.o. year old female  has a past medical history of Arthritis; Deviated septum; Fibroid tumor; GERD (gastroesophageal reflux disease); Hepatitis; Hypercholesteremia; Hypertension; Memory loss; Meniscus tear; Retinal detachment; Retinal detachment; and Spinal stenosis. here with:  1. Right optic neuritis 2. Lumbar radiculopathy 3. Memory disturbance  The patient will continue regular follow-ups with Dr. Katy Fitch and Dr. Brigitte Pulse for optic neuritis. She will continue with epidural steroid injection for lumbar radiculopathy. Her memory score has remained stable although she continues to notice changes at home. I will send the patient for neuropsychological testing. In the future we may consider repeating MRI  of the brain. Patient is amenable to this plan. She will follow-up in 6 months or sooner if needed.   Ward Givens, MSN, NP-C 11/20/2016, 2:02 PM [facility] [address], [place],  [address] [phone]

## 2016-11-21 NOTE — Progress Notes (Signed)
I have reviewed and agreed above plan. 

## 2016-12-07 ENCOUNTER — Encounter: Payer: Self-pay | Admitting: *Deleted

## 2016-12-07 ENCOUNTER — Telehealth: Payer: Self-pay | Admitting: Neurology

## 2016-12-07 MED ORDER — TRAMADOL HCL 50 MG PO TABS: 50.0000 mg | ORAL_TABLET | Freq: Four times a day (QID) | ORAL | 0 refills | Status: DC | PRN

## 2016-12-07 NOTE — Telephone Encounter (Signed)
Spoke to patient - she has worsening back pain - pending appt w/ Dr. Maryjean Ka next Thursday.  Dr. Krista Blue has provided her w/ rx for Tramadol 50mg , #30 x 0 to help w/ pain until she can further discuss with Dr. Maryjean Ka.  Pt agreeable to this plan.  Rx faxed and confirmed to pharmacy.

## 2016-12-07 NOTE — Addendum Note (Signed)
Addended by: Noberto Retort C on: 12/07/2016 03:27 PM   Modules accepted: Orders

## 2016-12-07 NOTE — Telephone Encounter (Signed)
[name] patients sister called office in reference to patient being in pain all week with no relief from [place].  [name] is requesting if patient can have something stronger due to pain.  Radiating from back to buttock.  Patient is scheduled next Thursday to have an injection.  Please call

## 2016-12-08 ENCOUNTER — Encounter: Payer: Self-pay | Admitting: Physician Assistant

## 2016-12-08 ENCOUNTER — Ambulatory Visit (INDEPENDENT_AMBULATORY_CARE_PROVIDER_SITE_OTHER): Payer: Medicare Other | Admitting: Physician Assistant

## 2016-12-08 VITALS — BP 132/82 | HR 70 | Temp 98.3°F | Resp 16 | Ht 66.0 in | Wt 206.2 lb

## 2016-12-08 DIAGNOSIS — R8299 Other abnormal findings in urine: Secondary | ICD-10-CM | POA: Diagnosis not present

## 2016-12-08 DIAGNOSIS — M545 Low back pain, unspecified: Secondary | ICD-10-CM

## 2016-12-08 DIAGNOSIS — R82998 Other abnormal findings in urine: Secondary | ICD-10-CM

## 2016-12-08 DIAGNOSIS — M5440 Lumbago with sciatica, unspecified side: Secondary | ICD-10-CM | POA: Insufficient documentation

## 2016-12-08 LAB — POCT URINALYSIS DIP (MANUAL ENTRY)
Glucose, UA: NEGATIVE
LEUKOCYTES UA: NEGATIVE
NITRITE UA: NEGATIVE
PH UA: 5 (ref 5.0–8.0)
RBC UA: NEGATIVE
Spec Grav, UA: 1.03 (ref 1.030–1.035)
UROBILINOGEN UA: 0.2 (ref ?–2.0)

## 2016-12-08 LAB — POC MICROSCOPIC URINALYSIS (UMFC)

## 2016-12-08 MED ORDER — METHOCARBAMOL 500 MG PO TABS: 500.0000 mg | ORAL_TABLET | Freq: Three times a day (TID) | ORAL | 0 refills | Status: DC | PRN

## 2016-12-08 MED ORDER — MELOXICAM 7.5 MG PO TABS: 7.5000 mg | ORAL_TABLET | Freq: Two times a day (BID) | ORAL | 0 refills | Status: DC

## 2016-12-08 NOTE — Progress Notes (Signed)
Patient ID: [name], female     DOB: 03-26-1956, 61 y.o.    MRN: [id number]  PCP: Jerlyn Ly, MD  Chief Complaint  Patient presents with  . Back Pain    since Sunday night, lower back pain     Subjective:   This patient presents for evaluation of her low back pain. States she woke up Sunday night monday Morning, A cutting pain on the right side "coner" throughout the week. Within the last week ben going to the bathroom more frequently than normal.  Bending hurts, standing strait up  Pain management specialist Dr Brayton Mars Next appointment is April 26th.  No pain on the left just right and middle. Difficulty sleeping as the pain keeps her awake. Taking tylenol for pain as well as her gabapentin for her chronic pain .  Denies trauma and any activity which insighting an event. Denies painful urination, burning or discharge. Denies sciatic pain for radiation to anywhere else.   Denies smoking drinking drugs, not sexually active.   Review of Systems   Prior to Admission medications   Medication Sig Start Date End Date Taking? Authorizing Provider  aspirin EC 81 MG tablet Take 1 tablet by mouth daily.   Yes Historical Provider, MD  azaTHIOprine (IMURAN) 50 MG tablet Take 1 tablet (50 mg total) by mouth daily. Patient taking differently: Take 50 mg by mouth 2 (two) times daily.  05/16/16  Yes Marcial Pacas, MD  CALCIUM PO Take 1 tablet by mouth daily.   Yes Historical Provider, MD  carvedilol (COREG) 25 MG tablet TAKE 1 TABLET (25 MG TOTAL) BY MOUTH 2 (TWO) TIMES DAILY WITH A MEAL. 07/05/16  Yes Marcial Pacas, MD  docusate sodium (COLACE) 100 MG capsule Take 100 mg by mouth daily as needed for mild constipation.   Yes Historical Provider, MD  folic acid (FOLVITE) 1 MG tablet Take 1 mg by mouth. 10/12/15  Yes Historical Provider, MD  gabapentin (NEURONTIN) 100 MG capsule Take 100 mg by mouth 3 (three) times daily.   Yes Historical Provider, MD  Glucos-Chond-Hyal Ac-Ca Fructo (MOVE  FREE JOINT HEALTH ADVANCE PO) Take 1 tablet by mouth 2 (two) times daily.   Yes Historical Provider, MD  lisinopril (PRINIVIL,ZESTRIL) 10 MG tablet TAKE 1 TABLET (10 MG TOTAL) BY MOUTH DAILY. 07/05/16  Yes Marcial Pacas, MD  nitroGLYCERIN (NITROSTAT) 0.4 MG SL tablet Place 0.4 mg under the tongue as directed. 01/21/15  Yes Historical Provider, MD  omeprazole (PRILOSEC) 20 MG capsule Take 1 capsule by mouth daily. 02/17/16  Yes Historical Provider, MD  ranitidine (ZANTAC) 150 MG tablet Take 150 mg by mouth 2 (two) times daily as needed for heartburn.    Yes Historical Provider, MD  traMADol (ULTRAM) 50 MG tablet Take 1 tablet (50 mg total) by mouth every 6 (six) hours as needed. 12/07/16  Yes Marcial Pacas, MD  pravastatin (PRAVACHOL) 20 MG tablet Take 20 mg by mouth daily. 07/21/15 07/20/16  Historical Provider, MD     No Known Allergies   Patient Active Problem List   Diagnosis Date Noted  . Lumbar radiculopathy 12/07/2015  . Abnormality of gait 12/07/2015  . Cervical stenosis of spinal canal 11/02/2015  . Low back pain 10/19/2015  . Spinal stenosis in cervical region 10/19/2015  . Bilateral hip pain 10/19/2015  . Cervical spondylosis without myelopathy 08/10/2015  . Excessive daytime sleepiness 08/10/2015  . Right optic neuritis 08/10/2015  . Abnormal finding on MRI of brain 08/10/2015  .  Cataract of left eye 08/03/2015  . Detached retina 08/03/2015  . Carotid artery narrowing 07/21/2015  . Abnormal glucose level 07/21/2015  . Hypertensive retinopathy 07/19/2015  . Edema, retina 07/19/2015  . Optic neuritis 07/10/2015  . Hypertension 07/10/2015  . Arthritis 07/10/2015  . Chest pain 01/21/2015  . Leg pain 01/21/2015  . Pseudoaphakia 12/22/2013  . Congenital anomaly of optic nerve ([place]) 12/12/2011  . 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 Blepharitis 12/12/2011  . Error, refractive, myopia 12/12/2011  . Posterior vitreous detachment 12/12/2011     Family History  Problem Relation Age of  Onset  . Stroke Mother   . Arrhythmia Mother   . Bladder Cancer Mother   . Thyroid disease Mother   . Heart disease Father   . Heart attack Father   . Stroke Maternal Grandfather   . Stroke Maternal Grandmother      Social History   Social History  . Marital status: Single    Spouse name: N/A  . Number of children: 0  . Years of education: Masters   Occupational History  . caregiver     Part-time   Social History Main Topics  . Smoking status: Never Smoker  . Smokeless tobacco: Never Used  . Alcohol use No  . Drug use: No  . Sexual activity: Not on file   Other Topics Concern  . Not on file   Social History Narrative   Lives at home with her sister.   Right-handed.   1 cup coffee daily.            Objective:  Physical Exam  Constitutional: She is oriented to person, place, and time. She appears well-developed and well-nourished.  HENT:  Head: Normocephalic.  Eyes: Pupils are equal, round, and reactive to light.  Neck: Normal range of motion. Neck supple.  Cardiovascular: Normal rate, regular rhythm and normal heart sounds.   Pulmonary/Chest: Effort normal and breath sounds normal.  Musculoskeletal:       Lumbar back: She exhibits decreased range of motion and tenderness.       Back:  Neurological: She is alert and oriented to person, place, and time. She has normal reflexes.  Skin: Skin is warm and dry.  Psychiatric: She has a normal mood and affect. Her behavior is normal. Judgment and thought content normal.         Assessment & Plan:  1. Acute right-sided low back pain without sciatica - POCT urinalysis dipstick - POCT Microscopic Urinalysis ([facility]) - methocarbamol (ROBAXIN) 500 MG tablet; Take 1 tablet (500 mg total) by mouth every 8 (eight) hours as needed for muscle spasms (Start at bed time and increase as needed).  Dispense: 30 tablet; Refill: 0 - meloxicam (MOBIC) 7.5 MG tablet; Take 1 tablet (7.5 mg total) by mouth 2 (two) times daily.   Dispense: 30 tablet; Refill: 0  2. Leukocytes in urine - Urine culture

## 2016-12-08 NOTE — Progress Notes (Signed)
[name]  MRN: [id number] DOB: 22-Oct-1955  PCP: Jerlyn Ly, MD  Chief Complaint  Patient presents with  . Back Pain    since Sunday night, lower back pain     Subjective:  Pt presents to clinic for right sided lower back pain for the last 4 days without known injury.  The pain is at her waist line and will sometimes radiate to the left waist line.  She has never had pain like this before with her chronic low back pain.  She has no pain radiation into her legs - she did have some pain at the beginning of the time going towards her buttocks but that resolved a few days ago.  No B/B incontinence.  She tried some ultram but that made her nauseated. She has some urinary frequency but she drinks a lot of water.  No dysuria or urinary urgency.  Review of Systems  Genitourinary: Negative.   Musculoskeletal: Positive for back pain. Negative for gait problem.    Patient Active Problem List   Diagnosis Date Noted  . Acute low back pain with sciatica 12/08/2016  . Lumbar radiculopathy 12/07/2015  . Abnormality of gait 12/07/2015  . Cervical stenosis of spinal canal 11/02/2015  . Low back pain 10/19/2015  . Spinal stenosis in cervical region 10/19/2015  . Bilateral hip pain 10/19/2015  . Cervical spondylosis without myelopathy 08/10/2015  . Excessive daytime sleepiness 08/10/2015  . Right optic neuritis 08/10/2015  . Abnormal finding on MRI of brain 08/10/2015  . Cataract of left eye 08/03/2015  . Detached retina 08/03/2015  . Carotid artery narrowing 07/21/2015  . Abnormal glucose level 07/21/2015  . Hypertensive retinopathy 07/19/2015  . Edema, retina 07/19/2015  . Optic neuritis 07/10/2015  . Hypertension 07/10/2015  . Arthritis 07/10/2015  . Chest pain 01/21/2015  . Leg pain 01/21/2015  . Pseudoaphakia 12/22/2013  . 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 Blepharitis 12/12/2011  . Error, refractive, myopia 12/12/2011  .  Posterior vitreous detachment 12/12/2011    Current Outpatient Prescriptions on File Prior to Visit  Medication Sig Dispense Refill  . aspirin EC 81 MG tablet Take 1 tablet by mouth daily.    Marland Kitchen azaTHIOprine (IMURAN) 50 MG tablet Take 1 tablet (50 mg total) by mouth daily. (Patient taking differently: Take 50 mg by mouth 2 (two) times daily. ) 90 tablet 4  . CALCIUM PO Take 1 tablet by mouth daily.    . carvedilol (COREG) 25 MG tablet TAKE 1 TABLET (25 MG TOTAL) BY MOUTH 2 (TWO) TIMES DAILY WITH A MEAL. 60 tablet 5  . docusate sodium (COLACE) 100 MG capsule Take 100 mg by mouth daily as needed for mild constipation.    . folic acid (FOLVITE) 1 MG tablet Take 1 mg by mouth.    . gabapentin (NEURONTIN) 100 MG capsule Take 100 mg by mouth 3 (three) times daily.    . Glucos-Chond-Hyal Ac-Ca Fructo (MOVE FREE JOINT HEALTH ADVANCE PO) Take 1 tablet by mouth 2 (two) times daily.    Marland Kitchen lisinopril (PRINIVIL,ZESTRIL) 10 MG tablet TAKE 1 TABLET (10 MG TOTAL) BY MOUTH DAILY. 30 tablet 5  . nitroGLYCERIN (NITROSTAT) 0.4 MG SL tablet Place 0.4 mg under the tongue as directed.    Marland Kitchen omeprazole (PRILOSEC) 20 MG capsule Take 1 capsule by mouth daily.  8  . ranitidine (ZANTAC) 150 MG tablet Take 150 mg by mouth 2 (two) times daily as needed for heartburn.     Marland Kitchen  traMADol (ULTRAM) 50 MG tablet Take 1 tablet (50 mg total) by mouth every 6 (six) hours as needed. 30 tablet 0  . pravastatin (PRAVACHOL) 20 MG tablet Take 20 mg by mouth daily.     No current facility-administered medications on file prior to visit.     No Known Allergies  Pt patients past, family and social history were reviewed and updated.   Objective:  BP 132/82 (BP Location: Right Arm, Patient Position: Sitting, Cuff Size: Small)   Pulse 70   Temp 98.3 F (36.8 C) (Oral)   Resp 16   Ht 5\' 6"  (1.676 m)   Wt 206 lb 3.2 oz (93.5 kg)   SpO2 97%   BMI 33.28 kg/m   Physical Exam  Constitutional: She is oriented to person, place, and time  and well-developed, well-nourished, and in no distress.  HENT:  Head: Normocephalic and atraumatic.  Right Ear: Hearing and external ear normal.  Left Ear: Hearing and external ear normal.  Eyes: Conjunctivae are normal.  Neck: Normal range of motion.  Pulmonary/Chest: Effort normal.  Musculoskeletal:       Lumbar back: She exhibits decreased range of motion and tenderness. She exhibits no spasm.       Back:  Neurological: She is alert and oriented to person, place, and time. She has normal sensation, normal strength and normal reflexes. She displays no weakness. She has a normal Straight Leg Raise Test. Gait normal. Gait normal.  Skin: Skin is warm and dry.  Psychiatric: Mood, memory, affect and judgment normal.  Vitals reviewed.  Results for orders placed or performed in visit on 12/08/16  POCT urinalysis dipstick  Result Value Ref Range   Color, UA yellow yellow   Clarity, UA clear clear   Glucose, UA negative negative   Bilirubin, UA small (A) negative   Ketones, POC UA trace (5) (A) negative   Spec Grav, UA 1.030 1.030 - 1.035   Blood, UA negative negative   pH, UA 5.0 5.0 - 8.0   Protein Ur, POC =30 (A) negative   Urobilinogen, UA 0.2 Negative - 2.0   Nitrite, UA Negative Negative   Leukocytes, UA Negative Negative  POCT Microscopic Urinalysis ([facility])  Result Value Ref Range   WBC,UR,HPF,POC Too numerous to count  (A) None WBC/hpf   RBC,UR,HPF,POC Few (A) None RBC/hpf   Bacteria Moderate (A) None, Too numerous to count   Mucus Present (A) Absent   Epithelial Cells, UR Per Microscopy Few (A) None, Too numerous to count cells/hpf   Calcium Oxalate Crystal moderate     Assessment and Plan :  Acute right-sided low back pain without sciatica - Plan: POCT urinalysis dipstick, POCT Microscopic Urinalysis ([facility]), methocarbamol (ROBAXIN) 500 MG tablet, meloxicam (MOBIC) 7.5 MG tablet - seems musculoskeletal in origin - if this continues PT could be helpful or referral to ortho  per patient decision.  Leukocytes in urine - Plan: Urine culture - urine dip was negative but the TNTC WBC on micro - will treat depending on urine culture results due to mild nonspecific urinary complaints.  Windell Hummingbird PA-C  [facility] 12/08/2016 5:25 PM

## 2016-12-08 NOTE — Patient Instructions (Addendum)
Use the Robaxin as needed starting tonight and increase to three times a day if needed. The anti-inflammatory (Mobic) can be dosed twice a day. If you are not needing to take it twice a day you can drop to one.    IF you received an x-ray today, you will receive an invoice from [facility]. Please contact [facility] at [phone] with questions or concerns regarding your invoice.   IF you received labwork today, you will receive an invoice from [facility]. Please contact [facility] at [phone] with questions or concerns regarding your invoice.   Our billing staff will not be able to assist you with questions regarding bills from these companies.  You will be contacted with the lab results as soon as they are available. The fastest way to get your results is to activate your My Chart account. Instructions are located on the last page of this paperwork. If you have not heard from [place] regarding the results in 2 weeks, please contact this office.     Tendinitis Tendinitis is inflammation of a tendon. A tendon is a strong cord of tissue that connects muscle to bone. Tendinitis can affect any tendon, but it most commonly affects the shoulder tendon (rotator cuff), ankle tendon (Achilles tendon), elbow tendon (triceps tendon), or one of the tendons in the wrist. What are the causes? This condition may be caused by:  Overusing a tendon or muscle. This is common.  Age-related wear and tear.  Injury.  Inflammatory conditions, such as arthritis.  Certain medicines. What increases the risk? This condition is more likely to develop in people who do activities that involve repetitive motions. What are the signs or symptoms? Symptoms of this condition may include:  Pain.  Tenderness.  Mild swelling. How is this diagnosed? This condition is diagnosed with a physical exam. You may also have tests, such as:  Ultrasound. This uses sound waves to make an image of your  affected area.  MRI. How is this treated? This condition may be treated by resting, icing, applying pressure (compression), and raising (elevating) the area above the level of your heart. This is known as RICE therapy. Treatment may also include:  Medicines to help reduce inflammation or to help reduce pain.  Exercises or physical therapy to strengthen and stretch the tendon.  A brace or splint.  Surgery (rare). Follow these instructions at home:   If you have a splint or brace:   Wear the splint or brace as told by your health care provider. Remove it only as told by your health care provider.  Loosen the splint or brace if your fingers or toes tingle, become numb, or turn cold and blue.  Do not take baths, swim, or use a hot tub until your health care provider approves. Ask your health care provider if you can take showers. You may only be allowed to take sponge baths for bathing.  Do not let your splint or brace get wet if it is not waterproof.  If your splint or brace is not waterproof, cover it with a watertight plastic bag when you take a bath or a shower.  Keep the splint or brace clean. Managing pain, stiffness, and swelling   If directed, apply ice to the affected area.  Put ice in a plastic bag.  Place a towel between your skin and the bag.  Leave the ice on for 20 minutes, 2-3 times a day.  If directed, apply heat to the affected area as often as  told by your health care provider. Use the heat source that your health care provider recommends, such as a moist heat pack or a heating pad.  Place a towel between your skin and the heat source.  Leave the heat on for 20-30 minutes.  Remove the heat if your skin turns bright red. This is especially important if you are unable to feel pain, heat, or cold. You may have a greater risk of getting burned.  Move the fingers or toes of the affected limb often, if this applies. This can help to prevent stiffness and lessen  swelling.  If directed, elevate the affected area above the level of your heart while you are sitting or lying down. Driving   Do not drive or operate heavy machinery while taking prescription pain medicine.  Ask your health care provider when it is safe to drive if you have a splint or brace on any part of your arm or leg. Activity   Return to your normal activities as told by your health care provider. Ask your health care provider what activities are safe for you.  Rest the affected area as told by your health care provider.  Avoid using the affected area while you are experiencing symptoms of tendinitis.  Do exercises as told by your health care provider. General instructions   If you have a splint, do not put pressure on any part of the splint until it is fully hardened. This may take several hours.  Wear an elastic bandage or compression wrap only as told by your health care provider.  Take over-the-counter and prescription medicines only as told by your health care provider.  Keep all follow-up visits as told by your health care provider. This is important. Contact a health care provider if:  Your symptoms do not improve.  You develop new, unexplained problems, such as numbness in your hands. This information is not intended to replace advice given to you by your health care provider. Make sure you discuss any questions you have with your health care provider. Document Released: 09/08/2000 Document Revised: 05/11/2016 Document Reviewed: 06/14/2015 Elsevier Interactive Patient Education  2017 [employer].

## 2016-12-10 LAB — URINE CULTURE
ORGANISM ID, BACTERIA: NO GROWTH
Organism ID, Bacteria: NO GROWTH

## 2016-12-31 ENCOUNTER — Other Ambulatory Visit: Payer: Self-pay | Admitting: Neurology

## 2017-05-02 DIAGNOSIS — Z79899 Other long term (current) drug therapy: Secondary | ICD-10-CM | POA: Insufficient documentation

## 2017-05-06 ENCOUNTER — Other Ambulatory Visit: Payer: Self-pay | Admitting: Neurology

## 2017-05-07 ENCOUNTER — Other Ambulatory Visit: Payer: Self-pay | Admitting: Neurology

## 2017-05-07 ENCOUNTER — Encounter: Payer: Self-pay | Admitting: *Deleted

## 2017-05-21 ENCOUNTER — Encounter: Payer: Self-pay | Admitting: Neurology

## 2017-05-21 ENCOUNTER — Ambulatory Visit (INDEPENDENT_AMBULATORY_CARE_PROVIDER_SITE_OTHER): Payer: Medicare Other | Admitting: Neurology

## 2017-05-21 VITALS — BP 152/88 | HR 72 | Ht 66.0 in | Wt 203.0 lb

## 2017-05-21 DIAGNOSIS — H469 Unspecified optic neuritis: Secondary | ICD-10-CM

## 2017-05-21 DIAGNOSIS — Q078 Other specified congenital malformations of nervous system: Secondary | ICD-10-CM | POA: Diagnosis not present

## 2017-05-21 DIAGNOSIS — H43819 Vitreous degeneration, unspecified eye: Secondary | ICD-10-CM | POA: Diagnosis not present

## 2017-05-21 DIAGNOSIS — R9089 Other abnormal findings on diagnostic imaging of central nervous system: Secondary | ICD-10-CM | POA: Diagnosis not present

## 2017-05-21 DIAGNOSIS — R413 Other amnesia: Secondary | ICD-10-CM

## 2017-05-21 DIAGNOSIS — R269 Unspecified abnormalities of gait and mobility: Secondary | ICD-10-CM

## 2017-05-21 MED ORDER — ALPRAZOLAM 1 MG PO TABS
1.0000 mg | ORAL_TABLET | Freq: Every evening | ORAL | 0 refills | Status: DC | PRN
Start: 2017-05-21 — End: 2017-09-25

## 2017-05-21 NOTE — Patient Instructions (Signed)
Please check vitamin B12, thyroid function test at her yearly follow-up visit

## 2017-05-21 NOTE — Progress Notes (Addendum)
  PATIENT: [name] DOB: 07/22/1956  REASON FOR VISIT: follow up- memory, optic neuritis, radiculopathy HISTORY FROM: patient  HISTORY OF PRESENT ILLNESS: HISTORY per Dr.  notes: [name] is a 61 years old right-handed female, seen in refer by her primary care physician, for evaluation of right visual distortion, possible right optic neuritis in July 22 2015.  She had a history of right retinal detachment in 2002, in early October 2016, she noticed blurry vision, color washing out, by July 10 2015, she realized when she close her left eye, she saw dark clusters of gray discoloration in her right visual field,  She was sent to the emergency room for evaluation, was diagnosed with right optic neuritis, stayed in hospital for IV Solu-Medrol 1000 mg for 4 days.  She reported over the past few years, she had intermittent left hand numbness, muscle cramping, but was never evaluated, she also complains bilateral lower extremity stiffness, give out underneath her sometimes. Extreme fatigue, she used to be active, retired schoolteacher, now she has difficulty mowing her yard because her severe fatigue.  She was also recently evaluated by [facility] ophthalmologist Dr. Rajiv Shah, confirmed her diagnosis of right optic neuritis , she also has a history of bilateral posterior vitreous detachment, hypertensive retinopathy of both eyes, she was given prednisone tapering dose, overall her vision has improved, but not back to her baseline  I have personally reviewed MRI of the brain with and without contrast October 2015 with patient, multi supratentorium lesions, few lesions are oval-shaped, oriented towards ventricle, no contrast enhancement. This certainly raises the possibility of relapsing remitting multiple sclerosis versus small vessel disease.  She also reported previous history of brain injury, basketball bad hit her head before, falling down 13 steps stairs  in the  past  Laboratory showed mild elevated glucose on BMP, normal CBC, Lyme titer, ESR was elevated 35  UPDATE Aug 10 2015:  I have personally reviewed MRI of the cervical in November third 2016 with patient: Severe spinal stenosis at C5-C6 with an AP diameter of 5.6 mm. There is subtle T2 hyperintense signal within the spinal cord below the point of maximal stenosis that could represent myelopathic changes associated with possible cord compression. Mild spinal stenosis at C6-C7 that does not lead to any spinal cord changes or nerve root compression.  Patient's right eye vision has significant improvement, about 80% back to baseline.  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 She also complains of nighttime snoring, excessive daytime sleepiness, fatigue during the daytime, ESS score is 10  UPDATE Oct 19 2015: She has developed moon face, she is now taking prednisone 30mg, prescribed by [facility] ophthalmologist Dr.Shah, she was given prednisone 60 mg daily since July 19 2015, only started tapering recently, she was also started on methotrexate 2.5 milligrams, 4 tablets once a week, along with folic acid since October 12 2015, She is now on Fosamax,  She continue complains of intermittent blurry vision involving both eye, she has complicated eye issues, bilateral cataract, posterior vitreous detachment, hypertensive retinopathy, right optic neuritis, right retinal detachment, status post scleral buckle/cryo in the past, laboratory evaluation in her January visit showed no evidence of systemic inflammatory process  She will have anterior approach cervical decompression surgery in November 02 2015.  She is now having increased pain at her   both hips, she also has low back pain, getting worse since Jan 2017, she has mild  gait difficulty also related to her bilateral hip pain, she denies bowel and bladder incontinence, generalized fatigue, even bilateral upper extremity weighted down  I have reviewed ophthalmologist note and called her ophthalmologist Dr. Manuella Ghazi, we agreed on quickly tapering off her steroid dosage, keep methotrexate 2.4 milligrams 4 tablets each week   Update December 07 2015: She had anterior cervical approach decompression and fusion of C5-6, C6-7 in November 02 2015, she complained of diffuse body achy pain, shortness of breath with minimum exertion  She has been complaining of worsening low back pain since January 2017, used to radiate to both hip, lower extremity, right leg has much improved, but she still has significant left hip pain, especially after bearing weight, she has increased nocturia,  We have personally reviewed x-ray of right and left hip, there was no significant pathology.  MRI of lumbar February 2017: Multilevel degenerative changes, most severe at L4-5: disc bulging and facet hypertrophy with moderate-severe spinal stenosis and mild biforaminal narrowing. L3-4: disc bulging and facet hypertrophy with mild-moderate spinal stenosis and no foraminal narrowing. Additional multi-level facet hypertrophy and disc bulging as above. Compared to MRI on 11/08/10, there has been some progressive worsening of degenerative spine disease and spinal stenosis.  The MRI findings, especially moderate to severe L4-5 spinal canal stenosis, and moderate to severe bilateral foraminal stenosis explains her complains of worsening low back pain, radiating pain to left lower extremity and gait difficulty, she is scheduled to have epidural injection in next few days   UPDATE May 16 2016: She is scheduled to have left cataract in June 08 2016,  She recovered well from cervical decompression surgery in February, no longer has significant neck pain, her low back pain is well controlled by epidural  injection, last injection was around May 2017,  She is concerned about her memory loss, word finding difficulty, forgot to clock in for her work, got confused while driving.  I personally reviewed CT head in Oct 2016, that was normal.  She has stopped taking gabapentin for concerning of blurry vision, it is hard for her to drive,  She reported normal laboratory evaluations by her primary care doctor Dr. Abner Greenspan including B12, TSH. Her mother suffered memory loss in her elderly age at age 1, her 2 maternal aunt suffered Alzheimer's disease.     UPDATE May 21 2017: I reviewed Dr. Trena Platt record in May 2018, right retinal detachment, optic neuritis Macular OU, hypertensive retinopathy on both eyes, she was diagnosed with exudative macular degeneration of right eye, had  new subretinal choroidal neurovascular membrane, she has Avastin of right eye on April 24 2017.  She is taking Imuran 9m qhs now, complains of GI side effect with higher dose,   Also reviewed her blood pressure record over the last week, the blood pressure was in the range of 84-124/51-76  She also complains of frequent vortex headaches, most of headache is at the middle of night, she did not complains of excessive daytimes sleepiness or fatigue.   I reviewed the sleep study in 2016 there was a concern of obstructive sleep apnea, she was recommended with sleep study with potential positive air pressure titration,  She used the wrong words, worsening memory loss,   I reviewed the laboratory evaluation in May 14 2017, mild elevated glucose 111, creatinine was normal 0.8, mild elevated WBC 11.2, normal TSH,    REVIEW OF SYSTEMS:  Out of a complete 14 system review of symptoms, the patient complains only of the following symptoms, and all other reviewed systems are negative.  Loss of vision, blurred vision, weight change, leg swelling, joint pain, back pain, walking difficulty, weakness, memory  loss  ALLERGIES: No Known Allergies  HOME MEDICATIONS: Outpatient Medications Prior to Visit  Medication Sig Dispense Refill  . aspirin EC 81 MG tablet Take 1 tablet by mouth daily.    Marland Kitchen azaTHIOprine (IMURAN) 50 MG tablet Take 1 tablet (50 mg total) by mouth daily. (Patient taking differently: Take 50 mg by mouth 2 (two) times daily. ) 90 tablet 4  . CALCIUM PO Take 1 tablet by mouth daily.    . carvedilol (COREG) 25 MG tablet TAKE 1 TABLET (25 MG TOTAL) BY MOUTH 2 (TWO) TIMES DAILY WITH A MEAL. 60 tablet 11  . docusate sodium (COLACE) 100 MG capsule Take 100 mg by mouth daily as needed for mild constipation.    . folic acid (FOLVITE) 1 MG tablet Take 1 mg by mouth.    . gabapentin (NEURONTIN) 100 MG capsule TAKE 3 CAPSULES (300 MG TOTAL) BY MOUTH 3 (THREE) TIMES DAILY. 270 capsule 3  . Glucos-Chond-Hyal Ac-Ca Fructo (MOVE FREE JOINT HEALTH ADVANCE PO) Take 1 tablet by mouth 2 (two) times daily.    Marland Kitchen lisinopril (PRINIVIL,ZESTRIL) 10 MG tablet TAKE 1 TABLET (10 MG TOTAL) BY MOUTH DAILY. 30 tablet 11  . meloxicam (MOBIC) 7.5 MG tablet Take 1 tablet (7.5 mg total) by mouth 2 (two) times daily. 30 tablet 0  . methocarbamol (ROBAXIN) 500 MG tablet Take 1 tablet (500 mg total) by mouth every 8 (eight) hours as needed for muscle spasms (Start at bed time and increase as needed). 30 tablet 0  . nitroGLYCERIN (NITROSTAT) 0.4 MG SL tablet Place 0.4 mg under the tongue as directed.    Marland Kitchen omeprazole (PRILOSEC) 20 MG capsule Take 1 capsule by mouth daily.  8  . ranitidine (ZANTAC) 150 MG tablet Take 150 mg by mouth 2 (two) times daily as needed for heartburn.     . traMADol (ULTRAM) 50 MG tablet Take 1 tablet (50 mg total) by mouth every 6 (six) hours as needed. 30 tablet 0  . pravastatin (PRAVACHOL) 20 MG tablet Take 20 mg by mouth daily.     No facility-administered medications prior to visit.     PAST MEDICAL HISTORY: Past Medical History:  Diagnosis Date  . Arthritis   . Deviated septum   .  Fibroid tumor   . GERD (gastroesophageal reflux disease)   . Hepatitis    AS CHILD    . Hypercholesteremia   . Hypertension   . Memory loss   . Meniscus tear   . MRSA infection   . Retinal detachment   . Retinal detachment   . Spinal stenosis     PAST SURGICAL HISTORY: Past Surgical History:  Procedure Laterality Date  . ANTERIOR CERVICAL DECOMP/DISCECTOMY FUSION N/A 11/02/2015   Procedure: ANTERIOR CERVICAL DECOMPRESSION FUSION CERVICAL FIVE-SIX,CERVICAL SIX-SEVEN;  Surgeon: Karie Chimera, MD;  Location: [facility];  Service: Neurosurgery;  Laterality: N/A;  right side approach  . CATARACT EXTRACTION Right   . fibroid tumor    . KNEE SURGERY Bilateral   . RETINAL DETACHMENT SURGERY Right     FAMILY HISTORY: Family History  Problem Relation Age of Onset  . Stroke Mother   . Arrhythmia Mother   . Bladder Cancer Mother   . Thyroid disease Mother   .  Heart disease Father   . Heart attack Father   . Stroke Maternal Grandfather   . Stroke Maternal Grandmother     SOCIAL HISTORY: Social History   Social History  . Marital status: Single    Spouse name: N/A  . Number of children: 0  . Years of education: Masters   Occupational History  . caregiver     Part-time   Social History Main Topics  . Smoking status: Never Smoker  . Smokeless tobacco: Never Used  . Alcohol use No  . Drug use: No  . Sexual activity: Not on file   Other Topics Concern  . Not on file   Social History Narrative   Lives at home with her sister.   Right-handed.   1 cup coffee daily.         PHYSICAL EXAM  Vitals:   05/21/17 1225  BP: (!) 152/88  Pulse: 72  Weight: 203 lb (92.1 kg)  Height: 5' 6" (1.676 m)   Body mass index is 32.77 kg/m.    MMSE - Mini Mental State Exam 05/21/2017 11/20/2016 05/16/2016  Orientation to time 5 5 5  Orientation to Place 5 5 5  Registration 3 3 3  Attention/ Calculation 5 5 5  Recall 3 3 3  Language- name 2 objects 2 2 2  Language- repeat  1 1 1  Language- follow 3 step command 3 3 3  Language- read & follow direction 1 1 1  Write a sentence 1 1 1  Copy design 1 1 1  Total score 30 30 30  animal naming 15  Generalized: Well developed, in no acute distress   Neurological examination  Mentation: Alert oriented to time, place, history taking. Follows all commands speech and language fluent Cranial nerve II-XII: Pupils were equal round reactive to light. Extraocular movements were full, visual field were full on confrontational test. Facial sensation and strength were normal. Uvula tongue midline. Head turning and shoulder shrug  were normal and symmetric. Motor: The motor testing reveals 5 over 5 strength of all 4 extremities. Good symmetric motor tone is noted throughout.  Sensory: Sensory testing is intact to soft touch on all 4 extremities. No evidence of extinction is noted.  Coordination: Cerebellar testing reveals good finger-nose-finger and heel-to-shin bilaterally.  Gait and station: Gait is slightly unsteady. Tandem gait is not attempted. Romberg is negative. Reflexes: Deep tendon reflexes are symmetric and normal bilaterally.   DIAGNOSTIC DATA (LABS, IMAGING, TESTING) - I reviewed patient records, labs, notes, testing and imaging myself where available.  Lab Results  Component Value Date   WBC 6.9 06/27/2016   HGB 13.3 06/27/2016   HCT 40.8 06/27/2016   MCV 97.6 06/27/2016   PLT 230 06/27/2016      Component Value Date/Time   NA 141 06/27/2016 1228   K 4.4 06/27/2016 1228   CL 107 06/27/2016 1228   CO2 29 06/27/2016 1228   GLUCOSE 111 (H) 06/27/2016 1228   BUN 17 06/27/2016 1228   CREATININE 0.78 06/27/2016 1228   CALCIUM 9.8 06/27/2016 1228   PROT 6.6 06/27/2016 1228   ALBUMIN 3.8 06/27/2016 1228   AST 17 06/27/2016 1228   ALT 14 06/27/2016 1228   ALKPHOS 45 06/27/2016 1228   BILITOT 0.7 06/27/2016 1228   GFRNONAA >60 06/27/2016 1228   GFRAA >60 06/27/2016 1228      ASSESSMENT AND PLAN 61  y.o. year old female  Her complains of generalized fatigue  Can be related to   her deconditioning, low blood pressure,  Early morning headaches,  This could due to obstructive sleep apnea,   Abnormal MRI of the brain in the past, worsening memory loss  We will repeat MRI of the brain with and without contrast  Marcial Pacas, M.D. Ph.D.  [facility] [place], [place] [address] Phone: [phone] Fax:      [phone]

## 2017-05-25 ENCOUNTER — Other Ambulatory Visit: Payer: Self-pay | Admitting: Internal Medicine

## 2017-05-25 DIAGNOSIS — Z1231 Encounter for screening mammogram for malignant neoplasm of breast: Secondary | ICD-10-CM

## 2017-05-29 ENCOUNTER — Encounter: Payer: Self-pay | Admitting: Neurology

## 2017-06-01 ENCOUNTER — Telehealth: Payer: Self-pay | Admitting: Neurology

## 2017-06-01 NOTE — Telephone Encounter (Signed)
Reviewed MRI of the brain and report from [facility], dated May 29 2017  No acute abnormality, multiple small vessel disease, which are nonspecific,

## 2017-06-04 NOTE — Telephone Encounter (Signed)
Left another message for a return call

## 2017-06-04 NOTE — Telephone Encounter (Signed)
Left message for a return call

## 2017-06-04 NOTE — Telephone Encounter (Signed)
Spoke to patient - she is aware of results and verbalized understanding. 

## 2017-06-25 ENCOUNTER — Ambulatory Visit (INDEPENDENT_AMBULATORY_CARE_PROVIDER_SITE_OTHER): Payer: Medicare Other | Admitting: Orthopaedic Surgery

## 2017-06-28 ENCOUNTER — Ambulatory Visit (INDEPENDENT_AMBULATORY_CARE_PROVIDER_SITE_OTHER): Payer: Medicare Other

## 2017-06-28 ENCOUNTER — Encounter (INDEPENDENT_AMBULATORY_CARE_PROVIDER_SITE_OTHER): Payer: Self-pay | Admitting: Orthopaedic Surgery

## 2017-06-28 ENCOUNTER — Ambulatory Visit (INDEPENDENT_AMBULATORY_CARE_PROVIDER_SITE_OTHER): Payer: Medicare Other | Admitting: Orthopaedic Surgery

## 2017-06-28 DIAGNOSIS — M1712 Unilateral primary osteoarthritis, left knee: Secondary | ICD-10-CM

## 2017-06-28 DIAGNOSIS — M1711 Unilateral primary osteoarthritis, right knee: Secondary | ICD-10-CM | POA: Diagnosis not present

## 2017-06-28 NOTE — Progress Notes (Signed)
Office Visit Note   Patient: MELAINIE KRINSKY           Date of Birth: [birth year]           MRN: [id number] Visit Date: 06/28/2017              Requested by: Crist Infante, MD [address] [place], [place] [address] PCP: Crist Infante, MD   Assessment & Plan: Visit Diagnoses:  1. Primary osteoarthritis of right knee   2. Unilateral primary osteoarthritis, left knee     Plan: Patient has advanced bilateral knee degenerative joint disease with bone-on-bone changes on x-ray and night pain.  We had a long discussion today regarding the treatment options. Given the findings on x-ray and what she is reporting I doubt that any conservative measures will give her any significant relief that she is looking for. I recommend total knee replacement which she is in agreement with. She will have to talk with her family to decide when it would be a good time to have the surgery done. She denies history of DVTs. She is currently on disability. Total face to face encounter time was greater than 45 minutes and over half of this time was spent in counseling and/or coordination of care.  Follow-Up Instructions: Return if symptoms worsen or fail to improve.   Orders:  Orders Placed This Encounter  Procedures  . XR KNEE 3 VIEW RIGHT  . XR KNEE 3 VIEW LEFT   No orders of the defined types were placed in this encounter.     Procedures: No procedures performed   Clinical Data: No additional findings.   Subjective: Chief Complaint  Patient presents with  . Right Knee - Pain  . Left Knee - Pain    Patient is 61 year old female who is referred to [place] by [facility] pain worse on the right. Patient has struggled with knee problems for several years and she is status post knee arthroscopy with debridements. She now complains of significant difficulty with ADLs and decline in her quality of life. The pain is worse with activity and weightbearing. She takes  ibuprofen with temporary relief. She has significant difficulty with getting out of chairs and ascending stairs. Denies any numbness and tingling. She has had injections in the past but not any recently    Review of Systems  Constitutional: Negative.   HENT: Negative.   Eyes: Negative.   Respiratory: Negative.   Cardiovascular: Negative.   Endocrine: Negative.   Musculoskeletal: Negative.   Neurological: Negative.   Hematological: Negative.   Psychiatric/Behavioral: Negative.   All other systems reviewed and are negative.    Objective: Vital Signs: There were no vitals taken for this visit.  Physical Exam  Constitutional: She is oriented to person, place, and time. She appears well-developed and well-nourished.  HENT:  Head: Normocephalic and atraumatic.  Eyes: EOM are normal.  Neck: Neck supple.  Pulmonary/Chest: Effort normal.  Abdominal: Soft.  Neurological: She is alert and oriented to person, place, and time.  Skin: Skin is warm. Capillary refill takes less than 2 seconds.  Psychiatric: She has a normal mood and affect. Her behavior is normal. Judgment and thought content normal.  Nursing note and vitals reviewed.   Ortho Exam Bilateral knee exam shows no joint effusion. Normal range of motion with significant patellar crepitus. Collaterals and cruciates are grossly intact. Specialty Comments:  No specialty comments available.  Imaging: Xr Knee 3 View Left  Result Date: 06/28/2017 Severe DJD  Xr Knee 3 View Right  Result Date: 06/28/2017 Severe DJD    PMFS History: Patient Active Problem List   Diagnosis Date Noted  . Memory loss 05/21/2017  . Acute low back pain with sciatica 12/08/2016  . Lumbar radiculopathy 12/07/2015  . Abnormality of gait 12/07/2015  . Cervical stenosis of spinal canal 11/02/2015  . Low back pain 10/19/2015  . Spinal stenosis in cervical region 10/19/2015  . Bilateral hip pain 10/19/2015  . Cervical spondylosis without  myelopathy 08/10/2015  . Excessive daytime sleepiness 08/10/2015  . Right optic neuritis 08/10/2015  . Abnormal finding on MRI of brain 08/10/2015  . Cataract of left eye 08/03/2015  . Detached retina 08/03/2015  . Carotid artery narrowing 07/21/2015  . Abnormal glucose level 07/21/2015  . Hypertensive retinopathy 07/19/2015  . Edema, retina 07/19/2015  . Optic neuritis 07/10/2015  . Hypertension 07/10/2015  . Arthritis 07/10/2015  . Chest pain 01/21/2015  . Leg pain 01/21/2015  . Pseudoaphakia 12/22/2013  . 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 Blepharitis 12/12/2011  . Error, refractive, myopia 12/12/2011  . Posterior vitreous detachment 12/12/2011   Past Medical History:  Diagnosis Date  . Arthritis   . Deviated septum   . Fibroid tumor   . GERD (gastroesophageal reflux disease)   . Hepatitis    AS CHILD    . Hypercholesteremia   . Hypertension   . Memory loss   . Meniscus tear   . MRSA infection   . Retinal detachment   . Retinal detachment   . Spinal stenosis     Family History  Problem Relation Age of Onset  . Stroke Mother   . Arrhythmia Mother   . Bladder Cancer Mother   . Thyroid disease Mother   . Heart disease Father   . Heart attack Father   . Stroke Maternal Grandfather   . Stroke Maternal Grandmother     Past Surgical History:  Procedure Laterality Date  . ANTERIOR CERVICAL DECOMP/DISCECTOMY FUSION N/A 11/02/2015   Procedure: ANTERIOR CERVICAL DECOMPRESSION FUSION CERVICAL FIVE-SIX,CERVICAL SIX-SEVEN;  Surgeon: Karie Chimera, MD;  Location: [facility];  Service: Neurosurgery;  Laterality: N/A;  right side approach  . CATARACT EXTRACTION Right   . fibroid tumor    . KNEE SURGERY Bilateral   . RETINAL DETACHMENT SURGERY Right    Social History   Occupational History  . caregiver     Part-time   Social History Main Topics  . Smoking status: Never Smoker  . Smokeless tobacco: Never Used  .  Alcohol use No  . Drug use: No  . Sexual activity: Not on file

## 2017-07-03 ENCOUNTER — Ambulatory Visit
Admission: RE | Admit: 2017-07-03 | Discharge: 2017-07-03 | Disposition: A | Discharge status: Home / Self Care | Payer: Medicare Other | Source: Ambulatory Visit | Attending: Internal Medicine | Admitting: Internal Medicine

## 2017-07-03 DIAGNOSIS — Z1231 Encounter for screening mammogram for malignant neoplasm of breast: Secondary | ICD-10-CM

## 2017-07-24 ENCOUNTER — Telehealth (INDEPENDENT_AMBULATORY_CARE_PROVIDER_SITE_OTHER): Payer: Self-pay | Admitting: Orthopaedic Surgery

## 2017-07-24 NOTE — Telephone Encounter (Signed)
See message below °

## 2017-07-24 NOTE — Telephone Encounter (Signed)
Patient came in the office this morning stating that she is just frustrated with getting her surgery scheduled.  She is wanting to have the surgery on January 16th.  She said that [facility] could email her if that is better for Sherrie to do.  Her email address is: [email or web address].  CB#[phone]

## 2017-07-30 NOTE — Telephone Encounter (Signed)
I called patient and left message with sister for return call.

## 2017-08-01 ENCOUNTER — Telehealth (INDEPENDENT_AMBULATORY_CARE_PROVIDER_SITE_OTHER): Payer: Self-pay

## 2017-08-01 NOTE — Telephone Encounter (Signed)
Patient is going to have a right TKA on 10-10-17.  She needs a letter stating this and how long she will be out of work.  Please mail to home address.

## 2017-08-02 ENCOUNTER — Encounter (INDEPENDENT_AMBULATORY_CARE_PROVIDER_SITE_OTHER): Payer: Self-pay

## 2017-08-02 NOTE — Telephone Encounter (Signed)
Approximately 12 weeks

## 2017-08-02 NOTE — Telephone Encounter (Signed)
Please advise how long she will be out of work after surgery.

## 2017-08-03 NOTE — Telephone Encounter (Signed)
Letter will be mailed out today.

## 2017-08-28 ENCOUNTER — Other Ambulatory Visit: Payer: Self-pay | Admitting: Neurology

## 2017-09-25 ENCOUNTER — Emergency Department (HOSPITAL_COMMUNITY): Payer: Medicare Other

## 2017-09-25 ENCOUNTER — Observation Stay (HOSPITAL_COMMUNITY)
Admission: EM | Admit: 2017-09-25 | Discharge: 2017-09-28 | Disposition: A | Discharge status: Home / Self Care | Payer: Medicare Other | Attending: Internal Medicine | Admitting: Internal Medicine

## 2017-09-25 ENCOUNTER — Encounter (HOSPITAL_COMMUNITY): Payer: Self-pay | Admitting: Emergency Medicine

## 2017-09-25 ENCOUNTER — Other Ambulatory Visit: Payer: Self-pay

## 2017-09-25 ENCOUNTER — Observation Stay (HOSPITAL_COMMUNITY): Payer: Medicare Other

## 2017-09-25 DIAGNOSIS — I6523 Occlusion and stenosis of bilateral carotid arteries: Secondary | ICD-10-CM

## 2017-09-25 DIAGNOSIS — E7800 Pure hypercholesterolemia, unspecified: Secondary | ICD-10-CM | POA: Insufficient documentation

## 2017-09-25 DIAGNOSIS — I6782 Cerebral ischemia: Secondary | ICD-10-CM | POA: Diagnosis not present

## 2017-09-25 DIAGNOSIS — M170 Bilateral primary osteoarthritis of knee: Secondary | ICD-10-CM | POA: Diagnosis not present

## 2017-09-25 DIAGNOSIS — Z6832 Body mass index (BMI) 32.0-32.9, adult: Secondary | ICD-10-CM | POA: Insufficient documentation

## 2017-09-25 DIAGNOSIS — I10 Essential (primary) hypertension: Secondary | ICD-10-CM | POA: Diagnosis not present

## 2017-09-25 DIAGNOSIS — I739 Peripheral vascular disease, unspecified: Secondary | ICD-10-CM | POA: Diagnosis present

## 2017-09-25 DIAGNOSIS — R531 Weakness: Secondary | ICD-10-CM | POA: Insufficient documentation

## 2017-09-25 DIAGNOSIS — Z7982 Long term (current) use of aspirin: Secondary | ICD-10-CM | POA: Insufficient documentation

## 2017-09-25 DIAGNOSIS — Z8619 Personal history of other infectious and parasitic diseases: Secondary | ICD-10-CM | POA: Insufficient documentation

## 2017-09-25 DIAGNOSIS — R112 Nausea with vomiting, unspecified: Secondary | ICD-10-CM | POA: Insufficient documentation

## 2017-09-25 DIAGNOSIS — R42 Dizziness and giddiness: Secondary | ICD-10-CM | POA: Diagnosis not present

## 2017-09-25 DIAGNOSIS — I6521 Occlusion and stenosis of right carotid artery: Secondary | ICD-10-CM | POA: Insufficient documentation

## 2017-09-25 DIAGNOSIS — Z8614 Personal history of Methicillin resistant Staphylococcus aureus infection: Secondary | ICD-10-CM | POA: Diagnosis not present

## 2017-09-25 DIAGNOSIS — E1165 Type 2 diabetes mellitus with hyperglycemia: Secondary | ICD-10-CM | POA: Insufficient documentation

## 2017-09-25 DIAGNOSIS — E785 Hyperlipidemia, unspecified: Secondary | ICD-10-CM | POA: Diagnosis present

## 2017-09-25 DIAGNOSIS — E669 Obesity, unspecified: Secondary | ICD-10-CM | POA: Diagnosis not present

## 2017-09-25 DIAGNOSIS — Z981 Arthrodesis status: Secondary | ICD-10-CM | POA: Insufficient documentation

## 2017-09-25 DIAGNOSIS — K219 Gastro-esophageal reflux disease without esophagitis: Secondary | ICD-10-CM | POA: Diagnosis not present

## 2017-09-25 DIAGNOSIS — Z96653 Presence of artificial knee joint, bilateral: Secondary | ICD-10-CM | POA: Diagnosis not present

## 2017-09-25 DIAGNOSIS — Z79899 Other long term (current) drug therapy: Secondary | ICD-10-CM | POA: Insufficient documentation

## 2017-09-25 DIAGNOSIS — I6529 Occlusion and stenosis of unspecified carotid artery: Secondary | ICD-10-CM | POA: Diagnosis present

## 2017-09-25 LAB — URINALYSIS, ROUTINE W REFLEX MICROSCOPIC
BILIRUBIN URINE: NEGATIVE
Glucose, UA: NEGATIVE mg/dL
HGB URINE DIPSTICK: NEGATIVE
KETONES UR: NEGATIVE mg/dL
Leukocytes, UA: NEGATIVE
NITRITE: NEGATIVE
PH: 8 (ref 5.0–8.0)
Protein, ur: NEGATIVE mg/dL
Specific Gravity, Urine: 1.016 (ref 1.005–1.030)

## 2017-09-25 LAB — COMPREHENSIVE METABOLIC PANEL
ALBUMIN: 3.6 g/dL (ref 3.5–5.0)
ALK PHOS: 59 U/L (ref 38–126)
ALT: 22 U/L (ref 14–54)
AST: 31 U/L (ref 15–41)
Anion gap: 7 (ref 5–15)
BILIRUBIN TOTAL: 0.6 mg/dL (ref 0.3–1.2)
BUN: 23 mg/dL — AB (ref 6–20)
CALCIUM: 9.4 mg/dL (ref 8.9–10.3)
CO2: 27 mmol/L (ref 22–32)
Chloride: 104 mmol/L (ref 101–111)
Creatinine, Ser: 0.82 mg/dL (ref 0.44–1.00)
GFR calc Af Amer: >60 mL/min (ref >60)
GFR calc non Af Amer: >60 mL/min (ref >60)
Glucose, Bld: 130 mg/dL — ABNORMAL HIGH (ref 65–99)
Potassium: 3.7 mmol/L (ref 3.5–5.1)
Sodium: 138 mmol/L (ref 135–145)
TOTAL PROTEIN: 6.3 g/dL — AB (ref 6.5–8.1)

## 2017-09-25 LAB — CREATININE, SERUM: CREATININE: 0.69 mg/dL (ref 0.44–1.00)

## 2017-09-25 LAB — CBC
HCT: 37.7 % (ref 36.0–46.0)
HEMATOCRIT: 40.9 % (ref 36.0–46.0)
Hemoglobin: 13.9 g/dL (ref 12.0–15.0)
Hemoglobin: 12.8 g/dL (ref 12.0–15.0)
MCH: 33 pg (ref 26.0–34.0)
MCH: 33.2 pg (ref 26.0–34.0)
MCHC: 34 g/dL (ref 30.0–36.0)
MCHC: 34 g/dL (ref 30.0–36.0)
MCV: 97.1 fL (ref 78.0–100.0)
MCV: 97.7 fL (ref 78.0–100.0)
PLATELETS: 214 10*3/uL (ref 150–400)
Platelets: 184 10*3/uL (ref 150–400)
RBC: 4.21 MIL/uL (ref 3.87–5.11)
RBC: 3.86 MIL/uL — ABNORMAL LOW (ref 3.87–5.11)
RDW: 13.5 % (ref 11.5–15.5)
RDW: 13.6 % (ref 11.5–15.5)
WBC: 8.5 10*3/uL (ref 4.0–10.5)
WBC: 7.8 10*3/uL (ref 4.0–10.5)

## 2017-09-25 LAB — I-STAT TROPONIN, ED
Troponin i, poc: 0 ng/mL (ref 0.00–0.08)
Troponin i, poc: 0.01 ng/mL (ref 0.00–0.08)
Troponin i, poc: 0.01 ng/mL (ref 0.00–0.08)

## 2017-09-25 LAB — HEMOGLOBIN A1C
Hgb A1c MFr Bld: 5.8 % — ABNORMAL HIGH (ref 4.8–5.6)
Mean Plasma Glucose: 119.76 mg/dL

## 2017-09-25 LAB — LIPASE, BLOOD: Lipase: 25 U/L (ref 11–51)

## 2017-09-25 LAB — MAGNESIUM: Magnesium: 1.9 mg/dL (ref 1.7–2.4)

## 2017-09-25 LAB — TSH: TSH: 1.135 u[IU]/mL (ref 0.350–4.500)

## 2017-09-25 LAB — PHOSPHORUS: Phosphorus: 3.2 mg/dL (ref 2.5–4.6)

## 2017-09-25 MED ORDER — LORAZEPAM 2 MG/ML IJ SOLN
1.0000 mg | Freq: Once | INTRAMUSCULAR | Status: AC
  Administered 2017-09-25: 1 mg via INTRAVENOUS
  Filled 2017-09-25: qty 1

## 2017-09-25 MED ORDER — PRAVASTATIN SODIUM 20 MG PO TABS
20.0000 mg | ORAL_TABLET | Freq: Every evening | ORAL | Status: DC
  Administered 2017-09-25 – 2017-09-27 (×3): 20 mg via ORAL
  Filled 2017-09-25 (×3): qty 1

## 2017-09-25 MED ORDER — ACETAMINOPHEN 500 MG PO TABS
1000.0000 mg | ORAL_TABLET | Freq: Three times a day (TID) | ORAL | Status: DC | PRN
  Administered 2017-09-26 (×2): 1000 mg via ORAL
  Filled 2017-09-25 (×2): qty 2

## 2017-09-25 MED ORDER — DOCUSATE SODIUM 100 MG PO CAPS
100.0000 mg | ORAL_CAPSULE | Freq: Every day | ORAL | Status: DC
  Administered 2017-09-25 – 2017-09-28 (×4): 100 mg via ORAL
  Filled 2017-09-25 (×4): qty 1

## 2017-09-25 MED ORDER — CALCIUM CARBONATE 1250 (500 CA) MG PO TABS
3.0000 | ORAL_TABLET | Freq: Every day | ORAL | Status: DC
  Administered 2017-09-25 – 2017-09-28 (×4): 1500 mg via ORAL
  Filled 2017-09-25 (×4): qty 2

## 2017-09-25 MED ORDER — FOLIC ACID 1 MG PO TABS
1.0000 mg | ORAL_TABLET | Freq: Every day | ORAL | Status: DC
  Administered 2017-09-25 – 2017-09-28 (×4): 1 mg via ORAL
  Filled 2017-09-25 (×4): qty 1

## 2017-09-25 MED ORDER — ONDANSETRON HCL 4 MG/2ML IJ SOLN
4.0000 mg | Freq: Four times a day (QID) | INTRAMUSCULAR | Status: DC | PRN
  Administered 2017-09-25: 4 mg via INTRAVENOUS
  Filled 2017-09-25: qty 2

## 2017-09-25 MED ORDER — FENTANYL CITRATE (PF) 100 MCG/2ML IJ SOLN
50.0000 ug | Freq: Once | INTRAMUSCULAR | Status: AC
  Administered 2017-09-25: 50 ug via INTRAVENOUS
  Filled 2017-09-25: qty 2

## 2017-09-25 MED ORDER — DIAZEPAM 5 MG/ML IJ SOLN
5.0000 mg | Freq: Once | INTRAMUSCULAR | Status: AC
  Administered 2017-09-25: 5 mg via INTRAVENOUS
  Filled 2017-09-25: qty 2

## 2017-09-25 MED ORDER — VITAMIN D 1000 UNITS PO TABS
2000.0000 [IU] | ORAL_TABLET | Freq: Every day | ORAL | Status: DC
  Administered 2017-09-25 – 2017-09-28 (×4): 2000 [IU] via ORAL
  Filled 2017-09-25 (×4): qty 2

## 2017-09-25 MED ORDER — MECLIZINE HCL 25 MG PO TABS: 25.0000 mg | ORAL_TABLET | Freq: Once | ORAL | Status: DC

## 2017-09-25 MED ORDER — PANTOPRAZOLE SODIUM 40 MG PO TBEC
40.0000 mg | DELAYED_RELEASE_TABLET | Freq: Every day | ORAL | Status: DC
  Administered 2017-09-25 – 2017-09-28 (×4): 40 mg via ORAL
  Filled 2017-09-25 (×4): qty 1

## 2017-09-25 MED ORDER — SODIUM CHLORIDE 0.9% FLUSH
3.0000 mL | Freq: Two times a day (BID) | INTRAVENOUS | Status: DC
  Administered 2017-09-25 – 2017-09-28 (×7): 3 mL via INTRAVENOUS

## 2017-09-25 MED ORDER — CARVEDILOL 25 MG PO TABS
25.0000 mg | ORAL_TABLET | Freq: Two times a day (BID) | ORAL | Status: DC
  Administered 2017-09-25 – 2017-09-28 (×6): 25 mg via ORAL
  Filled 2017-09-25 (×7): qty 1

## 2017-09-25 MED ORDER — HEPARIN SODIUM (PORCINE) 5000 UNIT/ML IJ SOLN
5000.0000 [IU] | Freq: Three times a day (TID) | INTRAMUSCULAR | Status: DC
  Administered 2017-09-25 – 2017-09-28 (×9): 5000 [IU] via SUBCUTANEOUS
  Filled 2017-09-25 (×9): qty 1

## 2017-09-25 MED ORDER — GABAPENTIN 300 MG PO CAPS
300.0000 mg | ORAL_CAPSULE | Freq: Three times a day (TID) | ORAL | Status: DC
  Administered 2017-09-25 – 2017-09-28 (×10): 300 mg via ORAL
  Filled 2017-09-25 (×10): qty 1

## 2017-09-25 MED ORDER — CALCIUM CARBONATE 1500 (600 CA) MG PO TABS
1500.0000 mg | ORAL_TABLET | Freq: Every day | ORAL | Status: DC
  Filled 2017-09-25: qty 1

## 2017-09-25 MED ORDER — MECLIZINE HCL 25 MG PO TABS
12.5000 mg | ORAL_TABLET | Freq: Once | ORAL | Status: AC
  Administered 2017-09-25: 12.5 mg via ORAL
  Filled 2017-09-25: qty 1

## 2017-09-25 MED ORDER — MECLIZINE HCL 25 MG PO TABS
25.0000 mg | ORAL_TABLET | Freq: Three times a day (TID) | ORAL | Status: DC | PRN
  Administered 2017-09-25: 25 mg via ORAL
  Filled 2017-09-25: qty 1

## 2017-09-25 MED ORDER — ONDANSETRON HCL 4 MG PO TABS
4.0000 mg | ORAL_TABLET | Freq: Four times a day (QID) | ORAL | Status: DC | PRN
  Administered 2017-09-28: 4 mg via ORAL
  Filled 2017-09-25: qty 1

## 2017-09-25 MED ORDER — ASPIRIN EC 81 MG PO TBEC
81.0000 mg | DELAYED_RELEASE_TABLET | Freq: Every day | ORAL | Status: DC
  Administered 2017-09-25 – 2017-09-28 (×4): 81 mg via ORAL
  Filled 2017-09-25 (×4): qty 1

## 2017-09-25 MED ORDER — AZATHIOPRINE 50 MG PO TABS
50.0000 mg | ORAL_TABLET | Freq: Every day | ORAL | Status: DC
  Administered 2017-09-25 – 2017-09-27 (×3): 50 mg via ORAL
  Filled 2017-09-25 (×3): qty 1

## 2017-09-25 MED ORDER — ONDANSETRON HCL 4 MG/2ML IJ SOLN
4.0000 mg | Freq: Once | INTRAMUSCULAR | Status: AC | PRN
  Administered 2017-09-25: 4 mg via INTRAVENOUS
  Filled 2017-09-25 (×2): qty 2

## 2017-09-25 NOTE — ED Triage Notes (Signed)
Pt BIB EMS from home with complaints of abdominal pain, nausea, vomiting and generalized body aches that started around 2255. Patient afebrile per EMS.

## 2017-09-25 NOTE — ED Notes (Signed)
MRI WILL BE HERE IN 1 HOUR FOR PT. REQUESTING ORDER FOR SEDATION AND REMOVAL OF CARDIAC MONITORING. ADMITTING MD PAGED

## 2017-09-25 NOTE — H&P (Signed)
History and Physical    [name] WCB:[id number] DOB: [birth year] DOA: 09/25/2017  PCP: Crist Infante, MD   I have briefly reviewed patients previous medical reports in [facility].  Patient coming from: Home  Chief Complaint: dizziness, nausea, vomiting and weakness.   HPI: [name] is a 62 year old woman with past medical history significant for hyperlipidemia, hypertension, obesity, osteoarthritis (status post bilateral knee replacement) and GERD; who presented to the emergency department secondary to dizziness, nausea and vomiting.  Patient reports symptoms started suddenly around 40 PM (the night prior to admission).  She felt the room spinning around her, with associated nausea, vomiting and generalized weakness.  She denies chest pain, palpitations, headaches, abdominal pain, coughing, shortness of breath, fever/chills or any other acute complaints.  Of note, no prior history of vertigo reported.  ED Course: Valium and meclizine given some blood work unremarkable and CT scan of the head negative for acute abnormalities.  Given patient's continue symptoms (especially ongoing dizziness) [facility] has been called to bring patient into the hospital for further evaluation and treatment.  Review of Systems:  All other systems reviewed and apart from HPI, are negative.  Past Medical History:  Diagnosis Date  . Arthritis   . Deviated septum   . Fibroid tumor   . GERD (gastroesophageal reflux disease)   . Hepatitis    AS CHILD    . Hypercholesteremia   . Hypertension   . Memory loss   . Meniscus tear   . MRSA infection   . Retinal detachment   . Retinal detachment   . Spinal stenosis     Past Surgical History:  Procedure Laterality Date  . ANTERIOR CERVICAL DECOMP/DISCECTOMY FUSION N/A 11/02/2015   Procedure: ANTERIOR CERVICAL DECOMPRESSION FUSION CERVICAL FIVE-SIX,CERVICAL SIX-SEVEN;  Surgeon: Karie Chimera, MD;  Location: [place] [facility];  Service: Neurosurgery;   Laterality: N/A;  right side approach  . CATARACT EXTRACTION Right   . fibroid tumor    . KNEE SURGERY Bilateral   . RETINAL DETACHMENT SURGERY Right     Social History  reports that  has never smoked. she has never used smokeless tobacco. She reports that she does not drink alcohol or use drugs.  No Known Allergies  Family History  Problem Relation Age of Onset  . Stroke Mother   . Arrhythmia Mother   . Bladder Cancer Mother   . Thyroid disease Mother   . Heart disease Father   . Heart attack Father   . Stroke Maternal Grandfather   . Stroke Maternal Grandmother     Prior to Admission medications   Medication Sig Start Date End Date Taking? Authorizing Provider  acetaminophen (TYLENOL) 500 MG tablet Take 1,000 mg by mouth every 6 (six) hours as needed for moderate pain.   Yes [provider]  aspirin EC 81 MG tablet Take 81 mg by mouth daily.    Yes [provider]  azaTHIOprine (IMURAN) 50 MG tablet Take 1 tablet (50 mg total) by mouth daily. Patient taking differently: Take 50 mg by mouth at bedtime.  05/16/16  Yes Marcial Pacas, MD  calcium carbonate (OSCAL) 1500 (600 Ca) MG TABS tablet Take 1,500 mg by mouth daily.   Yes [provider]  carvedilol (COREG) 25 MG tablet TAKE 1 TABLET (25 MG TOTAL) BY MOUTH 2 (TWO) TIMES DAILY WITH A MEAL. 01/01/17  Yes Marcial Pacas, MD  Cholecalciferol (VITAMIN D) 2000 units CAPS Take 2,000 Units by mouth daily.   Yes [provider]  docusate sodium (COLACE) 100 MG capsule Take 100 mg by mouth daily.    Yes [provider]  folic acid (FOLVITE) 1 MG tablet Take 1 mg by mouth daily.  10/12/15  Yes [provider]  gabapentin (NEURONTIN) 100 MG capsule TAKE 3 CAPSULES (300 MG TOTAL) BY MOUTH 3 (THREE) TIMES DAILY. 08/28/17  Yes Marcial Pacas, MD  omeprazole (PRILOSEC) 20 MG capsule Take 20 mg by mouth daily.  02/17/16  Yes [provider]  OVER THE COUNTER MEDICATION Take 1 tablet by mouth  daily. [employer]   Yes [provider]  pravastatin (PRAVACHOL) 20 MG tablet Take 20 mg by mouth every evening. 06/04/17  Yes [provider]  nitroGLYCERIN (NITROSTAT) 0.4 MG SL tablet Place 0.4 mg under the tongue every 5 (five) minutes as needed for chest pain.  01/21/15   [provider]    Physical Exam: Vitals:   09/25/17 0400 09/25/17 0430 09/25/17 0500 09/25/17 0530  BP: (!) 167/73 (!) 168/71 (!) 159/67 (!) 154/80  Pulse: (!) 59 62 64 66  Resp: 16 20 14 18   Temp:      TempSrc:      SpO2: 100% 100% 100% 100%    Constitutional: No chest pain, no shortness of breath, no palpitations, patient is still feeling nauseous and dizzy; but denies any further vomiting.  She is afebrile.  In no acute distress currently.  No focal motor or neurologic deficits on exam. Eyes: PERTLA, lids and conjunctivae normal, mild horizontal nystagmus appreciated. ENMT: Mucous membranes are moist. Posterior pharynx clear of any exudate or lesions. Normal dentition.  Neck: supple, no masses, no thyromegaly, no JVD Respiratory: clear to auscultation bilaterally, no wheezing, no crackles. Normal respiratory effort. No accessory muscle use.  Cardiovascular: S1 & S2 heard, regular rate and rhythm, no murmurs / rubs / gallops. No extremity edema. 2+ pedal pulses. No carotid bruits.  Abdomen: No distension, no tenderness, no masses palpated. No hepatosplenomegaly. Bowel sounds normal.  Musculoskeletal: no clubbing / cyanosis. Mild decrease ROM on her knees, no contractures. Normal muscle tone.  Skin: no rashes, lesions, ulcers. No induration Neurologic: CN 2-12 grossly intact. Sensation intact, DTR normal. Strength 5/5 in all 4 limbs.  Psychiatric: Normal judgment and insight. Alert and oriented x 3. Normal mood.    Labs on Admission: I have personally reviewed following labs and imaging studies  CBC: Recent Labs  Lab 09/25/17 0125  WBC 8.5  HGB 13.9  HCT 40.9  MCV 97.1  PLT 893    Basic Metabolic Panel: Recent Labs  Lab 09/25/17 0125  NA 138  K 3.7  CL 104  CO2 27  GLUCOSE 130*  BUN 23*  CREATININE 0.82  CALCIUM 9.4   Liver Function Tests: Recent Labs  Lab 09/25/17 0125  AST 31  ALT 22  ALKPHOS 59  BILITOT 0.6  PROT 6.3*  ALBUMIN 3.6   Urine analysis:    Component Value Date/Time   COLORURINE YELLOW 09/25/2017 0144   APPEARANCEUR CLOUDY (A) 09/25/2017 0144   LABSPEC 1.016 09/25/2017 0144   PHURINE 8.0 09/25/2017 0144   GLUCOSEU NEGATIVE 09/25/2017 0144   HGBUR NEGATIVE 09/25/2017 0144   BILIRUBINUR NEGATIVE 09/25/2017 0144   BILIRUBINUR small (A) 12/08/2016 1452   KETONESUR NEGATIVE 09/25/2017 0144   PROTEINUR NEGATIVE 09/25/2017 0144   UROBILINOGEN 0.2 12/08/2016 1452   NITRITE NEGATIVE 09/25/2017 0144   LEUKOCYTESUR NEGATIVE 09/25/2017 0144    Radiological Exams on Admission: Ct Head Wo Contrast  Result Date: 09/25/2017 CLINICAL DATA:  62 y/o  F; dizziness and weakness. EXAM: CT HEAD WITHOUT CONTRAST TECHNIQUE: Contiguous axial images were obtained from the base of the skull through the vertex without intravenous contrast. COMPARISON:  07/09/15 MRI head and 07/12/15 CT head. FINDINGS: Brain: No evidence of acute infarction, hemorrhage, hydrocephalus, extra-axial collection or mass lesion/mass effect. Vascular: Mild calcific atherosclerosis of carotid siphons. Skull: Normal. Negative for fracture or focal lesion. Sinuses/Orbits: No acute finding. Other: Right ocular banding. IMPRESSION: Negative CT of the head. Electronically Signed   By: Kristine Garbe M.D.   On: 09/25/2017 06:17    EKG:  Normal axis, regular rate and sinus rhythm, no acute ischemic changes.  Assessment/Plan 1-sudden episode of dizziness, nausea/vomiting and bilateral weakness: most likely due to Vertigo; but differential included CVATIA -neurology curbside and recommended MRI and MRA; if positive patient will need completion of work up for stroke and formal  neurology consult. -will use PRN antiemetics, PRN meclizine and vesibular assessment/resposiotioning by PT -Follow clinical response.  2-Hypertension -slightly elevated -will continue coreg BID -heart healthy diet ordered  3-Carotid artery narrowing -with last carotid dopplers showing 1-39% occlusion -continue ASA and statins -heart healthy diet emphasize    4-GERD (gastroesophageal reflux disease) -continue PPI  5-HLD (hyperlipidemia) -continue statins   6-Obesity -patient instructed to follow low calorie diet and increase physical activity   7-hyperglycemia -Prior history of diabetes -Will check hemoglobin A1c -Repeat CBG fasting in a.m.    Time: 70 minutes   DVT prophylaxis: Heparin Code Status: Full code Family Communication: Sister at bedside Disposition Plan: to be determined; but anticipating discharge back home when medically stable. Consults called: (Neurology curbside; recommendations given for MRI and MRA, if results positive, will need formal consultation and complete stroke work up). PT for vestibular training.  Admission status: Observation, length of stay less than 2 midnights, telemetry.   Barton Dubois MD [facility] Pager [phone]  If 7PM-7AM, please contact night-coverage [email or web address] Password [email or web address]  09/25/2017, 8:40 AM

## 2017-09-25 NOTE — ED Notes (Signed)
Bed: [facility] Expected date:  Expected time:  Means of arrival:  Comments: EMS 62 yo female from home weakness/nausea and vomiting back pain 170/80-no IV diverted from [facility]

## 2017-09-25 NOTE — ED Notes (Signed)
ED TO INPATIENT HANDOFF REPORT  Name/Age/Gender [name] 62 y.o. female  Code Status    Code Status Orders  (From admission, onward)        Start     Ordered   09/25/17 0833  Full code  Continuous     09/25/17 0836    Code Status History    Date Active Date Inactive Code Status Order ID Comments User Context   07/10/2015 05:23 07/13/2015 18:22 Full Code [id number]  Edwin Dada, MD Inpatient      Home/SNF/Other Home  Chief Complaint Emesis  Level of Care/Admitting Diagnosis ED Disposition    ED Disposition Condition [place]: [facility] [[id number]]  Level of Care: Telemetry [5]  Admit to tele based on following criteria: Monitor for Ischemic changes  Diagnosis: Dizziness [373428]  Admitting Physician: Barton Dubois [[id number]]  Attending Physician: Barton Dubois [[id number]]  PT Class (Do Not Modify): Observation [104]  PT Acc Code (Do Not Modify): Observation [10022]       Medical History Past Medical History:  Diagnosis Date  . Arthritis   . Deviated septum   . Fibroid tumor   . GERD (gastroesophageal reflux disease)   . Hepatitis    AS CHILD    . Hypercholesteremia   . Hypertension   . Memory loss   . Meniscus tear   . MRSA infection   . Retinal detachment   . Retinal detachment   . Spinal stenosis     Allergies No Known Allergies  IV Location/Drains/Wounds Patient Lines/Drains/Airways Status   Active Line/Drains/Airways    Name:   Placement date:   Placement time:   Site:   Days:   Peripheral IV 09/25/17 Left;Posterior Forearm   09/25/17    0120    Forearm   less than 1   External Urinary Catheter   09/25/17    0317    -   less than 1   Incision (Closed) 11/02/15 Neck Other (Comment)   11/02/15    0949     693          Labs/Imaging Results for orders placed or performed during the hospital encounter of 09/25/17 (from the past 48 hour(s))  Lipase, blood     Status: None   Collection  Time: 09/25/17  1:25 AM  Result Value Ref Range   Lipase 25 11 - 51 U/L  Comprehensive metabolic panel     Status: Abnormal   Collection Time: 09/25/17  1:25 AM  Result Value Ref Range   Sodium 138 135 - 145 mmol/L   Potassium 3.7 3.5 - 5.1 mmol/L   Chloride 104 101 - 111 mmol/L   CO2 27 22 - 32 mmol/L   Glucose, Bld 130 (H) 65 - 99 mg/dL   BUN 23 (H) 6 - 20 mg/dL   Creatinine, Ser 0.82 0.44 - 1.00 mg/dL   Calcium 9.4 8.9 - 10.3 mg/dL   Total Protein 6.3 (L) 6.5 - 8.1 g/dL   Albumin 3.6 3.5 - 5.0 g/dL   AST 31 15 - 41 U/L   ALT 22 14 - 54 U/L   Alkaline Phosphatase 59 38 - 126 U/L   Total Bilirubin 0.6 0.3 - 1.2 mg/dL   GFR calc non Af Amer >60 >60 mL/min   GFR calc Af Amer >60 >60 mL/min    Comment: (NOTE) The eGFR has been calculated using the CKD EPI equation. This calculation has not been validated in  all clinical situations. eGFR's persistently <60 mL/min signify possible Chronic Kidney Disease.    Anion gap 7 5 - 15  CBC     Status: None   Collection Time: 09/25/17  1:25 AM  Result Value Ref Range   WBC 8.5 4.0 - 10.5 K/uL   RBC 4.21 3.87 - 5.11 MIL/uL   Hemoglobin 13.9 12.0 - 15.0 g/dL   HCT 40.9 36.0 - 46.0 %   MCV 97.1 78.0 - 100.0 fL   MCH 33.0 26.0 - 34.0 pg   MCHC 34.0 30.0 - 36.0 g/dL   RDW 13.5 11.5 - 15.5 %   Platelets 184 150 - 400 K/uL  I-Stat Troponin, ED (not at [facility])     Status: None   Collection Time: 09/25/17  1:32 AM  Result Value Ref Range   Troponin i, poc 0.00 0.00 - 0.08 ng/mL   Comment 3            Comment: Due to the release kinetics of cTnI, a negative result within the first hours of the onset of symptoms does not rule out myocardial infarction with certainty. If myocardial infarction is still suspected, repeat the test at appropriate intervals.   Urinalysis, Routine w reflex microscopic     Status: Abnormal   Collection Time: 09/25/17  1:44 AM  Result Value Ref Range   Color, Urine YELLOW YELLOW   APPearance CLOUDY (A) CLEAR    Specific Gravity, Urine 1.016 1.005 - 1.030   pH 8.0 5.0 - 8.0   Glucose, UA NEGATIVE NEGATIVE mg/dL   Hgb urine dipstick NEGATIVE NEGATIVE   Bilirubin Urine NEGATIVE NEGATIVE   Ketones, ur NEGATIVE NEGATIVE mg/dL   Protein, ur NEGATIVE NEGATIVE mg/dL   Nitrite NEGATIVE NEGATIVE   Leukocytes, UA NEGATIVE NEGATIVE  I-Stat Troponin, ED (not at [facility])     Status: None   Collection Time: 09/25/17  6:13 AM  Result Value Ref Range   Troponin i, poc 0.01 0.00 - 0.08 ng/mL   Comment 3            Comment: Due to the release kinetics of cTnI, a negative result within the first hours of the onset of symptoms does not rule out myocardial infarction with certainty. If myocardial infarction is still suspected, repeat the test at appropriate intervals.   I-stat troponin, ED     Status: None   Collection Time: 09/25/17  6:13 AM  Result Value Ref Range   Troponin i, poc 0.01 0.00 - 0.08 ng/mL   Comment 3            Comment: Due to the release kinetics of cTnI, a negative result within the first hours of the onset of symptoms does not rule out myocardial infarction with certainty. If myocardial infarction is still suspected, repeat the test at appropriate intervals.    Ct Head Wo Contrast  Result Date: 09/25/2017 CLINICAL DATA:  62 y/o  F; dizziness and weakness. EXAM: CT HEAD WITHOUT CONTRAST TECHNIQUE: Contiguous axial images were obtained from the base of the skull through the vertex without intravenous contrast. COMPARISON:  07/09/15 MRI head and 07/12/15 CT head. FINDINGS: Brain: No evidence of acute infarction, hemorrhage, hydrocephalus, extra-axial collection or mass lesion/mass effect. Vascular: Mild calcific atherosclerosis of carotid siphons. Skull: Normal. Negative for fracture or focal lesion. Sinuses/Orbits: No acute finding. Other: Right ocular banding. IMPRESSION: Negative CT of the head. Electronically Signed   By: Kristine Garbe M.D.   On: 09/25/2017 06:17    Pending  Labs [employer] (From admission, onward)   Start     Ordered   09/26/17 5277  Basic metabolic panel  Tomorrow morning,   R     09/25/17 0836   09/26/17 0500  CBC  Tomorrow morning,   R     09/25/17 0836   09/25/17 0836  Hemoglobin A1c  Once,   R     09/25/17 0836   09/25/17 0835  Magnesium  Once,   R     09/25/17 0836   09/25/17 0835  Phosphorus  Once,   R     09/25/17 0836   09/25/17 0835  TSH  Once,   R     09/25/17 0836   09/25/17 0832  HIV antibody (Routine Testing)  Once,   R     09/25/17 0836   09/25/17 0832  CBC  (heparin)  Once,   R    Comments:  Baseline for heparin therapy IF NOT ALREADY DRAWN.  Notify MD if PLT < 100 K.    09/25/17 0836   09/25/17 0832  Creatinine, serum  (heparin)  Once,   R    Comments:  Baseline for heparin therapy IF NOT ALREADY DRAWN.    09/25/17 0836      Vitals/Pain Today's Vitals   09/25/17 0430 09/25/17 0500 09/25/17 0530 09/25/17 0617  BP: (!) 168/71 (!) 159/67 (!) 154/80   Pulse: 62 64 66   Resp: 20 14 18    Temp:      TempSrc:      SpO2: 100% 100% 100%   PainSc:    7     Isolation Precautions No active isolations  Medications Medications  meclizine (ANTIVERT) tablet 25 mg (not administered)  acetaminophen (TYLENOL) tablet 1,000 mg (not administered)  aspirin EC tablet 81 mg (not administered)  calcium carbonate (OSCAL) tablet 1,500 mg (not administered)  carvedilol (COREG) tablet 25 mg (not administered)  docusate sodium (COLACE) capsule 100 mg (not administered)  folic acid (FOLVITE) tablet 1 mg (not administered)  Vitamin D CAPS 2,000 Units (not administered)  pravastatin (PRAVACHOL) tablet 20 mg (not administered)  pantoprazole (PROTONIX) EC tablet 40 mg (not administered)  gabapentin (NEURONTIN) capsule 300 mg (not administered)  heparin injection 5,000 Units (not administered)  sodium chloride flush (NS) 0.9 % injection 3 mL (not administered)  ondansetron (ZOFRAN) tablet 4 mg (not administered)    Or   ondansetron (ZOFRAN) injection 4 mg (not administered)  ondansetron (ZOFRAN) injection 4 mg (4 mg Intravenous Given 09/25/17 0416)  diazepam (VALIUM) injection 5 mg (5 mg Intravenous Given 09/25/17 0203)  fentaNYL (SUBLIMAZE) injection 50 mcg (50 mcg Intravenous Given 09/25/17 0418)  meclizine (ANTIVERT) tablet 12.5 mg (12.5 mg Oral Given 09/25/17 0421)    Mobility walks

## 2017-09-25 NOTE — ED Notes (Addendum)
Patient complaining of dizziness and weakness. No cardiac history per patient, only hx of HTN. Dizziness increases with position changes.

## 2017-09-25 NOTE — ED Provider Notes (Signed)
[facility] DEPT Provider Note   CSN: [id number] Arrival date & time: 09/25/17  0027     History   Chief Complaint Chief Complaint  Patient presents with  . Generalized Body Aches  . Abdominal Pain    HPI [name] is a 62 y.o. female has medical history of GERD, hypertension who presents for evaluation of dizziness and generalized weakness that began approximately 11 PM last night.  Patient states that she was doing some chores around the house when she became dizzy.  She describes it as a "room spinning sensation."  That she also reports that she got extremely generalized weakness and nauseous.  Patient reports that she has had several episodes of vomiting since onset of symptoms.  Patient reports some secondary chest soreness associated from vomiting but otherwise no chest pain at rest.  Patient reports that she has had some progressively worsening shortness of breath of the last few weeks but otherwise states that she has been in her normal state of health prior to tonight's onset of symptoms.  She has not tried any medications for the symptoms.  Patient reports that her dad had a heart attack at age 59.  Patient denies any personal cardiac history.  Patient is not a current smoker and denies any illicit drug use or alcohol use.  Patient denies any fevers, chest pain, difficulty breathing, dysuria, hematuria.  Patient denies any urinary or bowel incontinence, saddle anesthesia.    The history is provided by the patient.    Past Medical History:  Diagnosis Date  . Arthritis   . Deviated septum   . Fibroid tumor   . GERD (gastroesophageal reflux disease)   . Hepatitis    AS CHILD    . Hypercholesteremia   . Hypertension   . Memory loss   . Meniscus tear   . MRSA infection   . Retinal detachment   . Retinal detachment   . Spinal stenosis     Patient Active Problem List   Diagnosis Date Noted  . Dizziness 09/25/2017  . GERD  (gastroesophageal reflux disease) 09/25/2017  . HLD (hyperlipidemia) 09/25/2017  . Obesity 09/25/2017  . Memory loss 05/21/2017  . Acute low back pain with sciatica 12/08/2016  . Lumbar radiculopathy 12/07/2015  . Abnormality of gait 12/07/2015  . Cervical stenosis of spinal canal 11/02/2015  . Low back pain 10/19/2015  . Spinal stenosis in cervical region 10/19/2015  . Bilateral hip pain 10/19/2015  . Cervical spondylosis without myelopathy 08/10/2015  . Excessive daytime sleepiness 08/10/2015  . Right optic neuritis 08/10/2015  . Abnormal finding on MRI of brain 08/10/2015  . Cataract of left eye 08/03/2015  . Detached retina 08/03/2015  . Carotid artery narrowing 07/21/2015  . Abnormal glucose level 07/21/2015  . Hypertensive retinopathy 07/19/2015  . Edema, retina 07/19/2015  . Optic neuritis 07/10/2015  . Hypertension 07/10/2015  . Arthritis 07/10/2015  . Chest pain 01/21/2015  . Leg pain 01/21/2015  . Pseudoaphakia 12/22/2013  . 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 Blepharitis 12/12/2011  . Error, refractive, myopia 12/12/2011  . Posterior vitreous detachment 12/12/2011    Past Surgical History:  Procedure Laterality Date  . ANTERIOR CERVICAL DECOMP/DISCECTOMY FUSION N/A 11/02/2015   Procedure: ANTERIOR CERVICAL DECOMPRESSION FUSION CERVICAL FIVE-SIX,CERVICAL SIX-SEVEN;  Surgeon: Karie Chimera, MD;  Location: [facility];  Service: Neurosurgery;  Laterality: N/A;  right side approach  . CATARACT EXTRACTION Right   . fibroid tumor    .  KNEE SURGERY Bilateral   . RETINAL DETACHMENT SURGERY Right     OB History    No data available       Home Medications    Prior to Admission medications   Medication Sig Start Date End Date Taking? Authorizing Provider  acetaminophen (TYLENOL) 500 MG tablet Take 1,000 mg by mouth every 6 (six) hours as needed for moderate pain.   Yes [provider]  aspirin EC 81 MG  tablet Take 81 mg by mouth daily.    Yes [provider]  azaTHIOprine (IMURAN) 50 MG tablet Take 1 tablet (50 mg total) by mouth daily. Patient taking differently: Take 50 mg by mouth at bedtime.  05/16/16  Yes Marcial Pacas, MD  calcium carbonate (OSCAL) 1500 (600 Ca) MG TABS tablet Take 1,500 mg by mouth daily.   Yes [provider]  carvedilol (COREG) 25 MG tablet TAKE 1 TABLET (25 MG TOTAL) BY MOUTH 2 (TWO) TIMES DAILY WITH A MEAL. 01/01/17  Yes Marcial Pacas, MD  Cholecalciferol (VITAMIN D) 2000 units CAPS Take 2,000 Units by mouth daily.   Yes [provider]  docusate sodium (COLACE) 100 MG capsule Take 100 mg by mouth daily.    Yes [provider]  folic acid (FOLVITE) 1 MG tablet Take 1 mg by mouth daily.  10/12/15  Yes [provider]  gabapentin (NEURONTIN) 100 MG capsule TAKE 3 CAPSULES (300 MG TOTAL) BY MOUTH 3 (THREE) TIMES DAILY. 08/28/17  Yes Marcial Pacas, MD  omeprazole (PRILOSEC) 20 MG capsule Take 20 mg by mouth daily.  02/17/16  Yes [provider]  OVER THE COUNTER MEDICATION Take 1 tablet by mouth daily. [facility]   Yes [provider]  pravastatin (PRAVACHOL) 20 MG tablet Take 20 mg by mouth every evening. 06/04/17  Yes [provider]  ALPRAZolam Duanne Moron) 1 MG tablet Take 1 tablet (1 mg total) by mouth at bedtime as needed for anxiety. Patient not taking: Reported on 09/25/2017 05/21/17   Marcial Pacas, MD  lisinopril (PRINIVIL,ZESTRIL) 10 MG tablet TAKE 1 TABLET (10 MG TOTAL) BY MOUTH DAILY. Patient not taking: Reported on 06/28/2017 01/01/17   Marcial Pacas, MD  meloxicam (MOBIC) 7.5 MG tablet Take 1 tablet (7.5 mg total) by mouth 2 (two) times daily. Patient not taking: Reported on 09/25/2017 12/08/16   Gale Journey, Damaris Hippo, PA-C  methocarbamol (ROBAXIN) 500 MG tablet Take 1 tablet (500 mg total) by mouth every 8 (eight) hours as needed for muscle spasms (Start at bed time and increase as needed). Patient not taking: Reported on  09/25/2017 12/08/16   Gale Journey, Damaris Hippo, PA-C  nitroGLYCERIN (NITROSTAT) 0.4 MG SL tablet Place 0.4 mg under the tongue every 5 (five) minutes as needed for chest pain.  01/21/15   [provider]  traMADol (ULTRAM) 50 MG tablet Take 1 tablet (50 mg total) by mouth every 6 (six) hours as needed. Patient not taking: Reported on 09/25/2017 12/07/16   Marcial Pacas, MD    Family History Family History  Problem Relation Age of Onset  . Stroke Mother   . Arrhythmia Mother   . Bladder Cancer Mother   . Thyroid disease Mother   . Heart disease Father   . Heart attack Father   . Stroke Maternal Grandfather   . Stroke Maternal Grandmother     Social History Social History   Tobacco Use  . Smoking status: Never Smoker  . Smokeless tobacco: Never Used  Substance Use Topics  . Alcohol  use: No  . Drug use: No     Allergies   Patient has no known allergies.   Review of Systems Review of Systems  Constitutional: Negative for fever.  Respiratory: Negative for cough and shortness of breath.   Cardiovascular: Negative for chest pain.  Gastrointestinal: Positive for nausea and vomiting. Negative for abdominal pain.  Genitourinary: Negative for dysuria and hematuria.  Neurological: Positive for dizziness and weakness (Generalized). Negative for headaches.     Physical Exam Updated Vital Signs BP (!) 154/80   Pulse 66   Temp 98 F (36.7 C) (Oral)   Resp 18   SpO2 100%   Physical Exam  Constitutional: She is oriented to person, place, and time. She appears well-developed and well-nourished.  Appears uncomfortable but no acute distress   HENT:  Head: Normocephalic and atraumatic.  Mouth/Throat: Oropharynx is clear and moist and mucous membranes are normal.  Eyes: Conjunctivae and lids are normal. Pupils are equal, round, and reactive to light. Right eye exhibits nystagmus. Left eye exhibits nystagmus.  Horizontal nystagmus noted  Neck: Full passive range of motion without pain.    Cardiovascular: Normal rate, regular rhythm, normal heart sounds and normal pulses. Exam reveals no gallop and no friction rub.  No murmur heard. Pulses:      Radial pulses are 2+ on the right side, and 2+ on the left side.       Dorsalis pedis pulses are 2+ on the right side, and 2+ on the left side.  Pulmonary/Chest: Effort normal and breath sounds normal.  Abdominal: Soft. Normal appearance. There is no tenderness. There is no rigidity and no guarding.  Musculoskeletal: Normal range of motion.  Neurological: She is alert and oriented to person, place, and time.  Induced dizziness with movement of head  Cranial nerves III-XII intact Follows commands, Moves all extremities  Symmetric strength to bilateral upper and lower extremities though weak throughout Sensation intact throughout all major nerve distributions Normal finger to nose Unable to assess gait secondary to dizziness.   Skin: Skin is warm and dry. Capillary refill takes less than 2 seconds. There is pallor.  Psychiatric: She has a normal mood and affect. Her speech is normal.  Nursing note and vitals reviewed.    ED Treatments / Results  Labs (all labs ordered are listed, but only abnormal results are displayed) Labs Reviewed  COMPREHENSIVE METABOLIC PANEL - Abnormal; Notable for the following components:      Result Value   Glucose, Bld 130 (*)    BUN 23 (*)    Total Protein 6.3 (*)    All other components within normal limits  URINALYSIS, ROUTINE W REFLEX MICROSCOPIC - Abnormal; Notable for the following components:   APPearance CLOUDY (*)    All other components within normal limits  LIPASE, BLOOD  CBC  I-STAT TROPONIN, ED  I-STAT TROPONIN, ED  I-STAT TROPONIN, ED    EKG  EKG Interpretation None       Radiology Ct Head Wo Contrast  Result Date: 09/25/2017 CLINICAL DATA:  62 y/o  F; dizziness and weakness. EXAM: CT HEAD WITHOUT CONTRAST TECHNIQUE: Contiguous axial images were obtained from the base  of the skull through the vertex without intravenous contrast. COMPARISON:  07/09/15 MRI head and 07/12/15 CT head. FINDINGS: Brain: No evidence of acute infarction, hemorrhage, hydrocephalus, extra-axial collection or mass lesion/mass effect. Vascular: Mild calcific atherosclerosis of carotid siphons. Skull: Normal. Negative for fracture or focal lesion. Sinuses/Orbits: No acute finding. Other: Right ocular banding.  IMPRESSION: Negative CT of the head. Electronically Signed   By: Kristine Garbe M.D.   On: 09/25/2017 06:17    Procedures Procedures (including critical care time)  Medications Ordered in ED Medications  meclizine (ANTIVERT) tablet 25 mg (not administered)  ondansetron (ZOFRAN) injection 4 mg (4 mg Intravenous Given 09/25/17 0416)  diazepam (VALIUM) injection 5 mg (5 mg Intravenous Given 09/25/17 0203)  fentaNYL (SUBLIMAZE) injection 50 mcg (50 mcg Intravenous Given 09/25/17 0418)  meclizine (ANTIVERT) tablet 12.5 mg (12.5 mg Oral Given 09/25/17 0421)     Initial Impression / Assessment and Plan / ED Course  I have reviewed the triage vital signs and the nursing notes.  Pertinent labs & imaging results that were available during my care of the patient were reviewed by me and considered in my medical decision making (see chart for details).     62 y.o. F who presents for evaluation of dizziness that began at approxiamtely 11pm last night.  Associated with nausea.  Patient describes it as a room spinning sensation.  Patient denies any chest pain, difficulty breathing, abdominal pain. Patient is afebrile, non-toxic appearing, sitting comfortably on examination table. Vital signs reviewed. Patient is slightly hypertensive.  Physical exam shows some mild horizontal nystagmus. Benign abdominal exam. Patient is pale but no diaphoresis.  Equal pulses in all 4 extremities.  Patient has symmetric strength in bilateral upper and lower extremities but is generally weak throughout.  Patient  is unable to tolerate movements as it makes her dizziness worse.  Consider cardiac etiology versus vertigo versus infectious etiology.  Low suspicion for CVA given history/physical exam.  History/physical exam not concerning for aortic dissection.  History/physical exam not concerning for spinal abscess.  Plan for fluids in the department.  Will give symptomatic relief in the department.  Plan to check basic labs.  Labs reviewed.  Lipase unremarkable.  CMP unremarkable.  UA is negative for any acute signs of infectious etiology.  CBC is unremarkable.  Troponin negative.  Reevaluation.  Patient reports that she still having dizziness despite initial medications.  Nausea has improved and she has not had any vomiting since being here in the department.  We will plan additional medications in the department.  Given persistent symptoms, will plan for CT head for further evaluation.  Reevaluation.  Patient reports that she is still dizzy despite medications.  She is unable to sit up for long periods of time secondary to symptoms.  I cannot attempt to ambulate her in the department as she is too symptomatic.  Patient also reports that nausea has returned.  Repeat abdominal exam shows some diffuse tenderness that patient describes as a "soreness."  No focal point of tenderness.  Repeat neuro exam is improved as she has some improved strength back.  Pallor has improved.   CT head negative for any acute abnormality.  Given concerns for persistent symptoms despite multiple medications, will plan to call for admission.  Discussed patient with Dr. Carmelina Noun (Hospitalist). Will plan for admission.   Final Clinical Impressions(s) / ED Diagnoses   Final diagnoses:  Vertigo  Intractable vomiting with nausea, unspecified vomiting type    ED Discharge Orders    None       Desma Mcgregor 09/25/17 2025    Jola Schmidt, MD 09/25/17 [phone]

## 2017-09-25 NOTE — ED Notes (Signed)
Patient placed on 3L [place] due to [place], [place] notified

## 2017-09-25 NOTE — ED Notes (Signed)
MRI CALLED. PT CAN HAVE WITH SEDATION.

## 2017-09-25 NOTE — ED Notes (Signed)
ADMITTING MD [facility] Provider at bedside.

## 2017-09-25 NOTE — ED Notes (Signed)
Attempted to use bedpan with patient but changing positions makes patient dizzy, purewick to be placed.

## 2017-09-25 NOTE — Evaluation (Signed)
Physical Therapy Evaluation Patient Details Name: [name] MRN: [id number] DOB: 1956-09-25 Today's Date: 09/25/2017   History of Present Illness  62 year old woman with past medical history significant for hyperlipidemia, hypertension, obesity, osteoarthritis (reports plans for bilateral knee replacement) and GERD and presented to ED with symptoms of sudden episode of dizziness, nausea/vomiting and bilateral weakness.  MRI head negative for acute infarct      Clinical Impression  Pt reports mostly blurred vision and "funny head" feeling with oculomotor testing.  Attempted positional testing and appears negative for R sidelying test however L sidelying test possibly observed slight amount of nystagmus however pt unable to tolerate testing and had increased feeling of imbalance upon return to sitting with arms flailing.  Pt appears very anxious with testing and mobility.  Recommending f/u vestibular PT, most beneficial from outpatient PT if pt has transportation.  Will follow along during acute stay.    09/25/17  Vestibular Assessment  General Observation 62 year old woman with past medical history significant for hyperlipidemia, hypertension, obesity, osteoarthritis (reports plans for bilateral knee replacement) and GERD and presented to ED with symptoms of sudden episode of dizziness, nausea/vomiting and bilateral weakness.  MRI head negative for acute infarct  Symptom Behavior  Type of Dizziness Blurred vision  Frequency of Dizziness with looking down or laying flat  Duration of Dizziness positional per pt  Aggravating Factors Lying supine;Moving eyes  Relieving Factors Closing eyes;Rest  Occulomotor Exam  Occulomotor Alignment Normal  Spontaneous Absent  Gaze-induced (abnormal with end gazes, possibly from inability to focus)  Head shaking Horizontal Absent (pt reports "funny feeling" in her head)  Smooth Pursuits Comment (slow and difficulty focusing)  Saccades Comment (slow  and difficulty focusing, reports blurred vision)  Comment Pt with overall slow cautious eye movement, generally reports more blurred vision and difficulty focusing then dizziness  Positional Testing  Sidelying Test Sidelying Right;Sidelying Left  Sidelying Right  Sidelying Right Duration none, denies dizziness  Sidelying Left  Sidelying Left Duration at least one minute, limited as pt requested to sit upright  Sidelying Left Symptoms Other (comment) (observed very slight nystagmus geotrophic, difficulty observ)      Follow Up Recommendations Outpatient PT(vestibular neurorehab PT )    Equipment Recommendations  None recommended by PT    Recommendations for Other Services       Precautions / Restrictions Precautions Precautions: Fall      Mobility  Bed Mobility Overal bed mobility: Needs Assistance Bed Mobility: Supine to Sit;Sit to Supine     Supine to sit: Supervision Sit to supine: Supervision   General bed mobility comments: supervision for safety/support, pt anxious with mobility  Transfers                 General transfer comment: pt wished to return to supine after vestibular testing, plan to attempt next session  Ambulation/Gait                Stairs            Wheelchair Mobility    Modified Rankin (Stroke Patients Only)       Balance                                             Pertinent Vitals/Pain Pain Assessment: No/denies pain    Home Living Family/patient expects to be discharged to:: Private residence Living Arrangements: Other  relatives(sister)   Type of Home: House       Home Layout: One level Home Equipment: Walker - 2 wheels      Prior Function Level of Independence: Independent               Hand Dominance        Extremity/Trunk Assessment        Lower Extremity Assessment Lower Extremity Assessment: Generalized weakness(reports need for bil TKAs)    Cervical / Trunk  Assessment Cervical / Trunk Assessment: (hx spinal stenosis and low back pain per pt)  Communication   Communication: No difficulties  Cognition Arousal/Alertness: Awake/alert Behavior During Therapy: Anxious Overall Cognitive Status: Within Functional Limits for tasks assessed                                 General Comments: very anxious about nausea and symptoms returning      General Comments      Exercises     Assessment/Plan    PT Assessment Patient needs continued PT services  PT Problem List Decreased mobility;Decreased activity tolerance;Decreased balance;Decreased knowledge of use of DME       PT Treatment Interventions DME instruction;Gait training;Therapeutic exercise;Patient/family education;Therapeutic activities;Balance training;Functional mobility training(compensation techniques, CRT, vestibular exercises)    PT Goals (Current goals can be found in the Care Plan section)  Acute Rehab PT Goals PT Goal Formulation: With patient Time For Goal Achievement: 10/09/17 Potential to Achieve Goals: Good    Frequency Min 3X/week   Barriers to discharge        Co-evaluation               AM-PAC PT "6 Clicks" Daily Activity  Outcome Measure Difficulty turning over in bed (including adjusting bedclothes, sheets and blankets)?: None Difficulty moving from lying on back to sitting on the side of the bed? : A Lot Difficulty sitting down on and standing up from a chair with arms (e.g., wheelchair, bedside commode, etc,.)?: Unable Help needed moving to and from a bed to chair (including a wheelchair)?: A Lot Help needed walking in hospital room?: A Lot Help needed climbing 3-5 steps with a railing? : A Lot 6 Click Score: 13    End of Session   Activity Tolerance: Patient tolerated treatment well Patient left: in bed;with call bell/phone within reach Nurse Communication: Mobility status(request for nausea meds) PT Visit Diagnosis: BPPV(vertigo  with possible BPPV involvement, needs further testing) BPPV - Right/Left : Left    Time: 7035-0093 PT Time Calculation (min) (ACUTE ONLY): 24 min   Charges:   PT Evaluation $PT Eval Moderate Complexity: 1 Mod     PT G Codes:   PT G-Codes **NOT FOR INPATIENT CLASS** Functional Assessment Tool Used: AM-PAC 6 Clicks Basic Mobility;Clinical judgement Functional Limitation: Mobility: Walking and moving around Mobility: Walking and Moving Around Current Status (G1829): 100 percent impaired, limited or restricted Mobility: Walking and Moving Around Goal Status (H3716): At least 20 percent but less than 40 percent impaired, limited or restricted    Carmelia Bake, PT, DPT 09/25/2017 Pager: [phone]  York Ram E 09/25/2017, 4:58 PM

## 2017-09-26 DIAGNOSIS — R42 Dizziness and giddiness: Secondary | ICD-10-CM | POA: Diagnosis not present

## 2017-09-26 DIAGNOSIS — I10 Essential (primary) hypertension: Secondary | ICD-10-CM | POA: Diagnosis not present

## 2017-09-26 LAB — BASIC METABOLIC PANEL
Anion gap: 5 (ref 5–15)
BUN: 16 mg/dL (ref 6–20)
CALCIUM: 9.3 mg/dL (ref 8.9–10.3)
CHLORIDE: 108 mmol/L (ref 101–111)
CO2: 27 mmol/L (ref 22–32)
CREATININE: 0.79 mg/dL (ref 0.44–1.00)
GFR calc non Af Amer: >60 mL/min (ref >60)
GLUCOSE: 99 mg/dL (ref 65–99)
Potassium: 4 mmol/L (ref 3.5–5.1)
Sodium: 140 mmol/L (ref 135–145)

## 2017-09-26 LAB — CBC
HEMATOCRIT: 36.5 % (ref 36.0–46.0)
HEMOGLOBIN: 12.4 g/dL (ref 12.0–15.0)
MCH: 33.5 pg (ref 26.0–34.0)
MCHC: 34 g/dL (ref 30.0–36.0)
MCV: 98.6 fL (ref 78.0–100.0)
Platelets: 202 10*3/uL (ref 150–400)
RBC: 3.7 MIL/uL — AB (ref 3.87–5.11)
RDW: 14.1 % (ref 11.5–15.5)
WBC: 6.2 10*3/uL (ref 4.0–10.5)

## 2017-09-26 LAB — HIV ANTIBODY (ROUTINE TESTING W REFLEX): HIV Screen 4th Generation wRfx: NONREACTIVE

## 2017-09-26 MED ORDER — MECLIZINE HCL 25 MG PO TABS
25.0000 mg | ORAL_TABLET | Freq: Three times a day (TID) | ORAL | Status: DC
  Administered 2017-09-26 – 2017-09-28 (×7): 25 mg via ORAL
  Filled 2017-09-26 (×7): qty 1

## 2017-09-26 NOTE — Progress Notes (Signed)
Physical Therapy Treatment Patient Details Name: [name] MRN: [id number] DOB: 1955-12-02 Today's Date: 09/26/2017    History of Present Illness 62 year old woman with past medical history significant for hyperlipidemia, hypertension, obesity, osteoarthritis (reports plans for bilateral knee replacement) and GERD and presented to ED with symptoms of sudden episode of dizziness, nausea/vomiting and bilateral weakness.  MRI head negative for acute infarct    PT Comments    Pt eager to attempt ambulating and continues to appear a little anxious with mobility, she reports fear of symptoms returning.  Pt now reports "blurred vision" sensation with sit to stands and with looking downwards.  Discussed compensation techniques with mobility and pt able to tolerate ambulating 180 feet with RW.  Pt anticipates d/c home tomorrow.  She also reports she feels the Meclizine is helping.  Provided outpatient neurorehab referral handout.  Pt also reports she has a scheduled neurologist appointment in Feb so encouraged her to discuss this admission and her symptoms (now reports this sensation with sit to stands may have been intermittently occurring for a few weeks) with her neurologist.    Follow Up Recommendations  Outpatient PT(outpatient neuro PT - vestibular)     Equipment Recommendations  None recommended by PT    Recommendations for Other Services       Precautions / Restrictions Precautions Precautions: Fall    Mobility  Bed Mobility Overal bed mobility: Needs Assistance Bed Mobility: Supine to Sit     Supine to sit: Supervision     General bed mobility comments: supervision for safety/support, pt anxious with mobility  Transfers Overall transfer level: Needs assistance Equipment used: Rolling walker (2 wheeled) Transfers: Sit to/from Stand Sit to Stand: Min guard         General transfer comment: verbal cues for hand placement  Ambulation/Gait Ambulation/Gait assistance:  Min guard Ambulation Distance (Feet): 180 Feet Assistive device: Rolling walker (2 wheeled) Gait Pattern/deviations: Step-through pattern;Decreased stride length     General Gait Details: slow cautious gait however no dizziness or blurred vision, pt cued to look toward horizon as looking down she reports makes her more dizzy, able to slowly turn head left and right without symptoms   Stairs            Wheelchair Mobility    Modified Rankin (Stroke Patients Only)       Balance Overall balance assessment: (denies falls)                                          Cognition Arousal/Alertness: Awake/alert Behavior During Therapy: Anxious Overall Cognitive Status: Within Functional Limits for tasks assessed                                        Exercises      General Comments        Pertinent Vitals/Pain Pain Assessment: No/denies pain    Home Living                      Prior Function            PT Goals (current goals can now be found in the care plan section) Progress towards PT goals: Progressing toward goals    Frequency    Min 3X/week  PT Plan Current plan remains appropriate    Co-evaluation              AM-PAC PT "6 Clicks" Daily Activity  Outcome Measure  Difficulty turning over in bed (including adjusting bedclothes, sheets and blankets)?: None Difficulty moving from lying on back to sitting on the side of the bed? : A Little Difficulty sitting down on and standing up from a chair with arms (e.g., wheelchair, bedside commode, etc,.)?: A Little Help needed moving to and from a bed to chair (including a wheelchair)?: A Little Help needed walking in hospital room?: A Little Help needed climbing 3-5 steps with a railing? : A Little 6 Click Score: 19    End of Session Equipment Utilized During Treatment: Gait belt Activity Tolerance: Patient tolerated treatment well Patient left: with  call bell/phone within reach;in chair Nurse Communication: Mobility status PT Visit Diagnosis: Other (comment)(vertigo)     Time: 8938-1017 PT Time Calculation (min) (ACUTE ONLY): 22 min  Charges:  $Gait Training: 8-22 mins                    G Codes:       Carmelia Bake, PT, DPT 09/26/2017 Pager: [phone]  York Ram E 09/26/2017, 3:01 PM

## 2017-09-26 NOTE — Progress Notes (Signed)
[facility] PROGRESS NOTE    Progress Note  [name]  JJK:[id number] DOB: 1956-05-15 DOA: 09/25/2017 PCP: Cheryl Infante, MD     Brief Narrative:   [name] is an 62 y.o. female past medical history of essential hypertension osteoarthritis status post bilateral knee replacement who presents to the ED with vertiginous symptoms with nausea and vomiting.  Assessment/Plan:   Vertigo She has continued to vomit. MRI of the brain was negative for acute CVA.  More concerned about benign positional vertigo versus labyrinthitis, but the patient could not provide an accurate history about position or in her ear symptoms. I would go ahead and change her Antivert to schedule, continue Zofran for nausea. Probably home tomorrow once her vomiting is improved.    Essential hypertension Continue current medication.  Carotid artery narrowing Last carotid Doppler showed an ICA occlusion less than 39%. Continue aspirin and statins.  GERD (gastroesophageal reflux disease) Continue PPI.  DVT prophylaxis: lovenox Family Communication:none Disposition Plan/Barrier to D/C: home in am Code Status:     Code Status Orders  (From admission, onward)        Start     Ordered   09/25/17 0833  Full code  Continuous     09/25/17 0836    Code Status History    Date Active Date Inactive Code Status Order ID Comments User Context   07/10/2015 05:23 07/13/2015 18:22 Full Code [id number]  Cheryl Dada, MD Inpatient        IV Access:    Peripheral IV   Procedures and diagnostic studies:   Ct Head Wo Contrast  Result Date: 09/25/2017 CLINICAL DATA:  62 y/o  F; dizziness and weakness. EXAM: CT HEAD WITHOUT CONTRAST TECHNIQUE: Contiguous axial images were obtained from the base of the skull through the vertex without intravenous contrast. COMPARISON:  07/09/15 MRI head and 07/12/15 CT head. FINDINGS: Brain: No evidence of acute infarction, hemorrhage, hydrocephalus,  extra-axial collection or mass lesion/mass effect. Vascular: Mild calcific atherosclerosis of carotid siphons. Skull: Normal. Negative for fracture or focal lesion. Sinuses/Orbits: No acute finding. Other: Right ocular banding. IMPRESSION: Negative CT of the head. Electronically Signed   By: [name] M.D.   On: 09/25/2017 06:17   Mr [name] Head Wo Contrast  Result Date: 09/25/2017 CLINICAL DATA:  TIA EXAM: MRI HEAD WITHOUT CONTRAST MRA HEAD WITHOUT CONTRAST TECHNIQUE: Multiplanar, multiecho pulse sequences of the brain and surrounding structures were obtained without intravenous contrast. Angiographic images of the head were obtained using MRA technique without contrast. COMPARISON:  CT head 09/25/2017 FINDINGS: MRI HEAD FINDINGS Brain: Image quality degraded by motion Negative for acute infarct. Mild chronic microvascular ischemic change in the white matter. Negative for hydrocephalus, hemorrhage, or mass. Vascular: Normal arterial flow voids. Skull and upper cervical spine: Negative Sinuses/Orbits: Paranasal sinuses clear. Bilateral lens replacement. Staphyloma posterior globe bilaterally. Other: None MRA HEAD FINDINGS Image quality degraded by moderate motion. Both vertebral artery is patent to the basilar. Moderate narrowing distal right vertebral artery likely congenital due to prominent right PICA. Basilar widely patent. Posterior cerebral artery is patent bilaterally. Fetal origin left posterior cerebral artery Internal carotid artery patent bilaterally. Anterior and middle cerebral artery patent bilaterally. IMPRESSION: Image quality degraded by moderate motion Negative for acute infarct. Mild chronic microvascular ischemic change in the white matter Negative MRA head Electronically Signed   By: [name] M.D.   On: 09/25/2017 11:13   Mr Brain Wo Contrast  Result Date: 09/25/2017 CLINICAL DATA:  TIA  EXAM: MRI HEAD WITHOUT CONTRAST MRA HEAD WITHOUT CONTRAST TECHNIQUE: Multiplanar,  multiecho pulse sequences of the brain and surrounding structures were obtained without intravenous contrast. Angiographic images of the head were obtained using MRA technique without contrast. COMPARISON:  CT head 09/25/2017 FINDINGS: MRI HEAD FINDINGS Brain: Image quality degraded by motion Negative for acute infarct. Mild chronic microvascular ischemic change in the white matter. Negative for hydrocephalus, hemorrhage, or mass. Vascular: Normal arterial flow voids. Skull and upper cervical spine: Negative Sinuses/Orbits: Paranasal sinuses clear. Bilateral lens replacement. Staphyloma posterior globe bilaterally. Other: None MRA HEAD FINDINGS Image quality degraded by moderate motion. Both vertebral artery is patent to the basilar. Moderate narrowing distal right vertebral artery likely congenital due to prominent right PICA. Basilar widely patent. Posterior cerebral artery is patent bilaterally. Fetal origin left posterior cerebral artery Internal carotid artery patent bilaterally. Anterior and middle cerebral artery patent bilaterally. IMPRESSION: Image quality degraded by moderate motion Negative for acute infarct. Mild chronic microvascular ischemic change in the white matter Negative MRA head Electronically Signed   By: [name] M.D.   On: 09/25/2017 11:13     Medical Consultants:    None.  Anti-Infectives:   none  Subjective:    [name] she relates she continues to vomit she does not feel well cannot get up as her vertigo gets worse.  Objective:    Vitals:   09/25/17 0903 09/25/17 0948 09/25/17 2100 09/26/17 0436  BP:   (!) 103/42 (!) 142/73  Pulse:   73 77  Resp:   16 16  Temp:   98.4 F (36.9 C) 98.3 F (36.8 C)  TempSrc:   Oral Oral  SpO2:   96% 96%  Weight: 90.7 kg (200 lb) 90.7 kg (200 lb)    Height:  5\' 6"  (1.676 m)      Intake/Output Summary (Last 24 hours) at 09/26/2017 1015 Last data filed at 09/26/2017 0925 Gross per 24 hour  Intake 363 ml  Output 250  ml  Net 113 ml   Filed Weights   09/25/17 0903 09/25/17 0948  Weight: 90.7 kg (200 lb) 90.7 kg (200 lb)    Exam: General exam: In no acute distress. Respiratory system: Good air movement and clear to auscultation. Cardiovascular system: S1 & S2 heard, RRR.  Gastrointestinal system: Abdomen is nondistended, soft and nontender.  Extremities: No pedal edema. Skin: No rashes, lesions or ulcers Psychiatry: Judgement and insight appear normal. Mood & affect appropriate.    Data Reviewed:    Labs: Basic Metabolic Panel: Recent Labs  Lab 09/25/17 0125 09/25/17 1011 09/26/17 0438  NA 138  --  140  K 3.7  --  4.0  CL 104  --  108  CO2 27  --  27  GLUCOSE 130*  --  99  BUN 23*  --  16  CREATININE 0.82 0.69 0.79  CALCIUM 9.4  --  9.3  MG  --  1.9  --   PHOS  --  3.2  --    GFR Estimated Creatinine Clearance: 81.7 mL/min (by C-G formula based on SCr of 0.79 mg/dL). Liver Function Tests: Recent Labs  Lab 09/25/17 0125  AST 31  ALT 22  ALKPHOS 59  BILITOT 0.6  PROT 6.3*  ALBUMIN 3.6   Recent Labs  Lab 09/25/17 0125  LIPASE 25   No results for input(s): AMMONIA in the last 168 hours. Coagulation profile No results for input(s): INR, PROTIME in the last 168 hours.  CBC: Recent  Labs  Lab 09/25/17 0125 09/25/17 1011 09/26/17 0438  WBC 8.5 7.8 6.2  HGB 13.9 12.8 12.4  HCT 40.9 37.7 36.5  MCV 97.1 97.7 98.6  PLT 184 214 202   Cardiac Enzymes: No results for input(s): CKTOTAL, CKMB, CKMBINDEX, TROPONINI in the last 168 hours. BNP (last 3 results) No results for input(s): PROBNP in the last 8760 hours. CBG: No results for input(s): GLUCAP in the last 168 hours. D-Dimer: No results for input(s): DDIMER in the last 72 hours. Hgb A1c: Recent Labs    09/25/17 1011  HGBA1C 5.8*   Lipid Profile: No results for input(s): CHOL, HDL, LDLCALC, TRIG, CHOLHDL, LDLDIRECT in the last 72 hours. Thyroid function studies: Recent Labs    09/25/17 1011  TSH 1.135    Anemia work up: No results for input(s): VITAMINB12, FOLATE, FERRITIN, TIBC, IRON, RETICCTPCT in the last 72 hours. Sepsis Labs: Recent Labs  Lab 09/25/17 0125 09/25/17 1011 09/26/17 0438  WBC 8.5 7.8 6.2   Microbiology No results found for this or any previous visit (from the past 240 hour(s)).   Medications:   . aspirin EC  81 mg Oral Daily  . azaTHIOprine  50 mg Oral QHS  . calcium carbonate  3 tablet Oral Q lunch  . carvedilol  25 mg Oral BID WC  . cholecalciferol  2,000 Units Oral Daily  . docusate sodium  100 mg Oral Daily  . folic acid  1 mg Oral Daily  . gabapentin  300 mg Oral TID  . heparin  5,000 Units Subcutaneous Q8H  . pantoprazole  40 mg Oral Daily  . pravastatin  20 mg Oral QPM  . sodium chloride flush  3 mL Intravenous Q12H   Continuous Infusions:    LOS: 0 days   [facility] Pager [phone]  *Please refer to [email or web address], password [email or web address] to get updated schedule on who will round on this patient, as hospitalists switch teams weekly. If 7PM-7AM, please contact night-coverage at [email or web address], password [email or web address] for any overnight needs.  09/26/2017, 10:15 AM

## 2017-09-27 ENCOUNTER — Observation Stay (HOSPITAL_COMMUNITY): Payer: Medicare Other

## 2017-09-27 ENCOUNTER — Encounter (HOSPITAL_COMMUNITY): Payer: Self-pay | Admitting: Radiology

## 2017-09-27 DIAGNOSIS — R42 Dizziness and giddiness: Secondary | ICD-10-CM | POA: Diagnosis not present

## 2017-09-27 MED ORDER — IOPAMIDOL (ISOVUE-370) INJECTION 76%
INTRAVENOUS | Status: AC
  Administered 2017-09-27: 100 mL via INTRAVENOUS
  Filled 2017-09-27: qty 100

## 2017-09-27 MED ORDER — ONDANSETRON HCL 4 MG PO TABS: 4.0000 mg | ORAL_TABLET | Freq: Four times a day (QID) | ORAL | 0 refills | Status: DC | PRN

## 2017-09-27 MED ORDER — MECLIZINE HCL 25 MG PO TABS: 25.0000 mg | ORAL_TABLET | Freq: Three times a day (TID) | ORAL | 0 refills | Status: DC

## 2017-09-27 NOTE — Care Management Note (Signed)
Case Management Note  Patient Details  Name: [name] MRN: [id number] Date of Birth: 01-12-1956  Subjective/Objective:   Pt admitted with Vertigo                 Action/Plan: Plan to discharge home with no needs at present time.    Expected Discharge Date:  09/27/17               Expected Discharge Plan:  OP Rehab  In-House Referral:     Discharge planning Services  CM Consult  Post Acute Care Choice:    Choice offered to:     DME Arranged:    DME Agency:     HH Arranged:    HH Agency:     Status of Service:  Completed, signed off  If discussed at [employer] of Stay Meetings, dates discussed:    Additional CommentsPurcell Mouton, RN 09/27/2017, 3:58 PM

## 2017-09-27 NOTE — Care Management Obs Status (Signed)
[facility] NOTIFICATION   Patient Details  Name: [name] MRN: [id number] Date of Birth: 1955/10/01   Medicare Observation Status Notification Given:  Yes    Purcell Mouton, RN 09/27/2017, 4:14 PM

## 2017-09-27 NOTE — Discharge Summary (Addendum)
Physician Discharge Summary  [name] GDJ:[id number] DOB: [birth year] DOA: 09/25/2017  PCP: Crist Infante, MD  Admit date: 09/25/2017 Discharge date: 09/27/2017  Admitted From:HOME Disposition: HOME Recommendations for Outpatient Follow-up:  1. Follow up with PCP in 1-2 weeks 2. Please obtain BMP/CBC in one week  Home Health NONE Equipment/Devices: NONE Discharge Condition STABLE CODE STATUS FULL Diet recommendation: CARDIAC Brief/Interim Summary:62 year old woman with past medical history significant for hyperlipidemia, hypertension, obesity, osteoarthritis (status post bilateral knee replacement) and GERD; who presented to the emergency department secondary to dizziness, nausea and vomiting.  Patient reports symptoms started suddenly around 29 PM (the night prior to admission).  She felt the room spinning around her, with associated nausea, vomiting and generalized weakness.  She denies chest pain, palpitations, headaches, abdominal pain, coughing, shortness of breath, fever/chills or any other acute complaints.  Of note, no prior history of vertigo reported.  ED Course: Valium and meclizine given some blood work unremarkable and CT scan of the head negative for acute abnormalities.  Given patient's continue symptoms (especially ongoing dizziness) [facility] has been called to bring patient into the hospital for further evaluation and treatment. 09/27/2017- Patient tolerated diet without any vomiting.continues to have vertigo. dw neuro on call on phone. Discharge Diagnoses:  Principal Problem:   Vertigo Active Problems:   Hypertension   Carotid artery narrowing   GERD (gastroesophageal reflux disease)   HLD (hyperlipidemia)   Obesity  BPV-NEEDS ENT Eval as OP.Continue zofran and antivert.follow up with neuro dr Krista Blue.MRA ?congenital moderate narrowing of vert art?continue aspirin and statins.  MRI MRA of the brainImage quality degraded by moderate motion  Negative for acute infarct. Mild  chronic microvascular ischemic change in the white matter  Negative MRA head CT angiogram of the neck 09/27/2017-Patent carotid and vertebral arteries. No dissection, aneurysm, or significant stenosis by NASCET criteria. 2. Right carotid bifurcation fibrofatty plaque with mild 30-40% stenosis of distal common carotid and proximal internal carotid arteries.  Patient will be discharged on aspirin and statin. Discharge Instructions ent and neuro follow up  Allergies as of 09/27/2017   No Known Allergies     Medication List    STOP taking these medications   acetaminophen 500 MG tablet Commonly known as:  TYLENOL     TAKE these medications   aspirin EC 81 MG tablet Take 81 mg by mouth daily.   azaTHIOprine 50 MG tablet Commonly known as:  IMURAN Take 1 tablet (50 mg total) by mouth daily. What changed:  when to take this   calcium carbonate 1500 (600 Ca) MG Tabs tablet Commonly known as:  OSCAL Take 1,500 mg by mouth daily.   carvedilol 25 MG tablet Commonly known as:  COREG TAKE 1 TABLET (25 MG TOTAL) BY MOUTH 2 (TWO) TIMES DAILY WITH A MEAL.   docusate sodium 100 MG capsule Commonly known as:  COLACE Take 100 mg by mouth daily.   folic acid 1 MG tablet Commonly known as:  FOLVITE Take 1 mg by mouth daily.   gabapentin 100 MG capsule Commonly known as:  NEURONTIN TAKE 3 CAPSULES (300 MG TOTAL) BY MOUTH 3 (THREE) TIMES DAILY.   meclizine 25 MG tablet Commonly known as:  ANTIVERT Take 1 tablet (25 mg total) by mouth 3 (three) times daily.   NITROSTAT 0.4 MG SL tablet Generic drug:  nitroGLYCERIN Place 0.4 mg under the tongue every 5 (five) minutes as needed for chest pain.   omeprazole 20 MG capsule Commonly known as:  PRILOSEC Take 20  mg by mouth daily.   ondansetron 4 MG tablet Commonly known as:  ZOFRAN Take 1 tablet (4 mg total) by mouth every 6 (six) hours as needed for nausea.   OVER THE COUNTER MEDICATION Take 1 tablet by mouth daily. Vision Gold    pravastatin 20 MG tablet Commonly known as:  PRAVACHOL Take 20 mg by mouth every evening.   Vitamin D 2000 units Caps Take 2,000 Units by mouth daily.      Follow-up Information    Crist Infante, MD Follow up.   Specialty:  Internal Medicine Why:  patient needs ENT evaluation for BPV/LABRYNTHITIS. Contact information: [place] [address] [phone]        Marcial Pacas, MD Follow up.   Specialty:  Neurology Contact information: [place] [address] [phone]          No Known Allergies  Consultations: NONE   Procedures/Studies: Ct Head Wo Contrast  Result Date: 09/25/2017 CLINICAL DATA:  62 y/o  F; dizziness and weakness. EXAM: CT HEAD WITHOUT CONTRAST TECHNIQUE: Contiguous axial images were obtained from the base of the skull through the vertex without intravenous contrast. COMPARISON:  07/09/15 MRI head and 07/12/15 CT head. FINDINGS: Brain: No evidence of acute infarction, hemorrhage, hydrocephalus, extra-axial collection or mass lesion/mass effect. Vascular: Mild calcific atherosclerosis of carotid siphons. Skull: Normal. Negative for fracture or focal lesion. Sinuses/Orbits: No acute finding. Other: Right ocular banding. IMPRESSION: Negative CT of the head. Electronically Signed   By: Kristine Garbe M.D.   On: 09/25/2017 06:17   Mr Jodene Nam Head Wo Contrast  Result Date: 09/25/2017 CLINICAL DATA:  TIA EXAM: MRI HEAD WITHOUT CONTRAST MRA HEAD WITHOUT CONTRAST TECHNIQUE: Multiplanar, multiecho pulse sequences of the brain and surrounding structures were obtained without intravenous contrast. Angiographic images of the head were obtained using MRA technique without contrast. COMPARISON:  CT head 09/25/2017 FINDINGS: MRI HEAD FINDINGS Brain: Image quality degraded by motion Negative for acute infarct. Mild chronic microvascular ischemic change in the white matter. Negative for hydrocephalus, hemorrhage, or mass. Vascular:  Normal arterial flow voids. Skull and upper cervical spine: Negative Sinuses/Orbits: Paranasal sinuses clear. Bilateral lens replacement. Staphyloma posterior globe bilaterally. Other: None MRA HEAD FINDINGS Image quality degraded by moderate motion. Both vertebral artery is patent to the basilar. Moderate narrowing distal right vertebral artery likely congenital due to prominent right PICA. Basilar widely patent. Posterior cerebral artery is patent bilaterally. Fetal origin left posterior cerebral artery Internal carotid artery patent bilaterally. Anterior and middle cerebral artery patent bilaterally. IMPRESSION: Image quality degraded by moderate motion Negative for acute infarct. Mild chronic microvascular ischemic change in the white matter Negative MRA head Electronically Signed   By: Franchot Gallo M.D.   On: 09/25/2017 11:13   Mr Brain Wo Contrast  Result Date: 09/25/2017 CLINICAL DATA:  TIA EXAM: MRI HEAD WITHOUT CONTRAST MRA HEAD WITHOUT CONTRAST TECHNIQUE: Multiplanar, multiecho pulse sequences of the brain and surrounding structures were obtained without intravenous contrast. Angiographic images of the head were obtained using MRA technique without contrast. COMPARISON:  CT head 09/25/2017 FINDINGS: MRI HEAD FINDINGS Brain: Image quality degraded by motion Negative for acute infarct. Mild chronic microvascular ischemic change in the white matter. Negative for hydrocephalus, hemorrhage, or mass. Vascular: Normal arterial flow voids. Skull and upper cervical spine: Negative Sinuses/Orbits: Paranasal sinuses clear. Bilateral lens replacement. Staphyloma posterior globe bilaterally. Other: None MRA HEAD FINDINGS Image quality degraded by moderate motion. Both vertebral artery is patent to the basilar.  Moderate narrowing distal right vertebral artery likely congenital due to prominent right PICA. Basilar widely patent. Posterior cerebral artery is patent bilaterally. Fetal origin left posterior cerebral  artery Internal carotid artery patent bilaterally. Anterior and middle cerebral artery patent bilaterally. IMPRESSION: Image quality degraded by moderate motion Negative for acute infarct. Mild chronic microvascular ischemic change in the white matter Negative MRA head Electronically Signed   By: Franchot Gallo M.D.   On: 09/25/2017 11:13    (Echo, Carotid, EGD, Colonoscopy, ERCP)    Subjective:   Discharge Exam: Vitals:   09/26/17 2046 09/27/17 0600  BP: (!) 109/49 (!) 148/66  Pulse: 72 66  Resp: 16 14  Temp: 98.4 F (36.9 C) 97.9 F (36.6 C)  SpO2: 97% 97%   Vitals:   09/26/17 1300 09/26/17 1847 09/26/17 2046 09/27/17 0600  BP: (!) 104/48 (!) 96/56 (!) 109/49 (!) 148/66  Pulse: 72 79 72 66  Resp: 20  16 14   Temp: 98.4 F (36.9 C)  98.4 F (36.9 C) 97.9 F (36.6 C)  TempSrc: Oral  Oral Oral  SpO2: 94% 95% 97% 97%  Weight:      Height:        General: Pt is alert, awake, not in acute distress Cardiovascular: RRR, S1/S2 +, no rubs, no gallops Respiratory: CTA bilaterally, no wheezing, no rhonchi Abdominal: Soft, NT, ND, bowel sounds + Extremities: no edema, no cyanosis    The results of significant diagnostics from this hospitalization (including imaging, microbiology, ancillary and laboratory) are listed below for reference.     Microbiology: No results found for this or any previous visit (from the past 240 hour(s)).   Labs: BNP (last 3 results) No results for input(s): BNP in the last 8760 hours. Basic Metabolic Panel: Recent Labs  Lab 09/25/17 0125 09/25/17 1011 09/26/17 0438  NA 138  --  140  K 3.7  --  4.0  CL 104  --  108  CO2 27  --  27  GLUCOSE 130*  --  99  BUN 23*  --  16  CREATININE 0.82 0.69 0.79  CALCIUM 9.4  --  9.3  MG  --  1.9  --   PHOS  --  3.2  --    Liver Function Tests: Recent Labs  Lab 09/25/17 0125  AST 31  ALT 22  ALKPHOS 59  BILITOT 0.6  PROT 6.3*  ALBUMIN 3.6   Recent Labs  Lab 09/25/17 0125  LIPASE 25   No  results for input(s): AMMONIA in the last 168 hours. CBC: Recent Labs  Lab 09/25/17 0125 09/25/17 1011 09/26/17 0438  WBC 8.5 7.8 6.2  HGB 13.9 12.8 12.4  HCT 40.9 37.7 36.5  MCV 97.1 97.7 98.6  PLT 184 214 202   Cardiac Enzymes: No results for input(s): CKTOTAL, CKMB, CKMBINDEX, TROPONINI in the last 168 hours. BNP: Invalid input(s): POCBNP CBG: No results for input(s): GLUCAP in the last 168 hours. D-Dimer No results for input(s): DDIMER in the last 72 hours. Hgb A1c Recent Labs    09/25/17 1011  HGBA1C 5.8*   Lipid Profile No results for input(s): CHOL, HDL, LDLCALC, TRIG, CHOLHDL, LDLDIRECT in the last 72 hours. Thyroid function studies Recent Labs    09/25/17 1011  TSH 1.135   Anemia work up No results for input(s): VITAMINB12, FOLATE, FERRITIN, TIBC, IRON, RETICCTPCT in the last 72 hours. Urinalysis    Component Value Date/Time   COLORURINE YELLOW 09/25/2017 0144   APPEARANCEUR CLOUDY (A) 09/25/2017  0144   LABSPEC 1.016 09/25/2017 0144   PHURINE 8.0 09/25/2017 0144   GLUCOSEU NEGATIVE 09/25/2017 0144   HGBUR NEGATIVE 09/25/2017 0144   BILIRUBINUR NEGATIVE 09/25/2017 0144   BILIRUBINUR small (A) 12/08/2016 1452   KETONESUR NEGATIVE 09/25/2017 0144   PROTEINUR NEGATIVE 09/25/2017 0144   UROBILINOGEN 0.2 12/08/2016 1452   NITRITE NEGATIVE 09/25/2017 0144   LEUKOCYTESUR NEGATIVE 09/25/2017 0144   Sepsis Labs Invalid input(s): PROCALCITONIN,  WBC,  LACTICIDVEN Microbiology No results found for this or any previous visit (from the past 240 hour(s)).   Time coordinating discharge: Over 30 minutes  SIGNED:   Georgette Shell, MD  [facility] 09/27/2017, 1:18 PM  If 7PM-7AM, please contact night-coverage [email or web address] Password [email or web address]

## 2017-09-27 NOTE — Progress Notes (Signed)
Physical Therapy Treatment Patient Details Name: [name] MRN: [id number] DOB: 20-Jul-1956 Today's Date: 09/27/2017    History of Present Illness 62 year old woman with past medical history significant for hyperlipidemia, hypertension, obesity, osteoarthritis (reports plans for bilateral knee replacement) and GERD and presented to ED with symptoms of sudden episode of dizziness, nausea/vomiting and bilateral weakness.  MRI head negative for acute infarct    PT Comments    Pt reports spinning episode this morning with bed mobility.  Attempted to assist pt to sitting and she was unable to tolerate supine to upright and returned to supine, observed latent nystagmus with return to supine.  Pt agreeable to perform positional testing.  Performed horizontal test bilaterally and no symptoms reported and no signs of nystagmus.  Then performed R sidelying test which was also negative.  Performed Left sidelying test and pt presented with rotational nystagmus which lasted approx 30 seconds.  Pt then assisted with performing Semont maneuver.  Pt presents with symptoms of R BPPV and recommended she f/u with outpatient vestibular PT (referral form provided yesterday and present in her room) if symptoms continue to persist.  If she remains in acute, will have vestibular OT follow up tomorrow.  Pt did not want to mobilize after positional testing/maneuver.   Follow Up Recommendations  Outpatient PT(outpatient neuro PT -vestibular)     Equipment Recommendations  None recommended by PT    Recommendations for Other Services       Precautions / Restrictions Precautions Precautions: Fall    Mobility  Bed Mobility                  Transfers                    Ambulation/Gait                 Stairs            Wheelchair Mobility    Modified Rankin (Stroke Patients Only)       Balance                                            Cognition  Arousal/Alertness: Awake/alert Behavior During Therapy: Anxious Overall Cognitive Status: Within Functional Limits for tasks assessed                                        Exercises      General Comments        Pertinent Vitals/Pain Pain Assessment: No/denies pain    Home Living                      Prior Function            PT Goals (current goals can now be found in the care plan section) Progress towards PT goals: Progressing toward goals    Frequency    Min 3X/week      PT Plan Current plan remains appropriate    Co-evaluation              AM-PAC PT "6 Clicks" Daily Activity  Outcome Measure  Difficulty turning over in bed (including adjusting bedclothes, sheets and blankets)?: None Difficulty moving from lying on back to sitting on the side of the bed? :  A Little Difficulty sitting down on and standing up from a chair with arms (e.g., wheelchair, bedside commode, etc,.)?: A Little Help needed moving to and from a bed to chair (including a wheelchair)?: A Little Help needed walking in hospital room?: A Little Help needed climbing 3-5 steps with a railing? : A Little 6 Click Score: 19    End of Session   Activity Tolerance: Patient tolerated treatment well Patient left: with call bell/phone within reach;in bed Nurse Communication: Mobility status PT Visit Diagnosis: BPPV BPPV - Right/Left : Right     Time: 5170-0174 PT Time Calculation (min) (ACUTE ONLY): 31 min  Charges:  $Canalith Rep Proc: 8-22 mins $Physical Performance Test: 8-22 mins                    G Codes:       Carmelia Bake, PT, DPT 09/27/2017 Pager: [phone]  York Ram E 09/27/2017, 4:07 PM

## 2017-09-27 NOTE — Care Management Obs Status (Signed)
[place] NOTIFICATION   Patient Details  Name: SHARLIE SHREFFLER MRN: [id number] Date of Birth: May 05, 1956   Medicare Observation Status Notification Given:  Yes    Purcell Mouton, RN 09/27/2017, 4:14 PM

## 2017-09-27 NOTE — Progress Notes (Signed)
PROGRESS NOTE    [name]  LNL:[id number] DOB: 08/30/1956 DOA: 09/25/2017 PCP: Crist Infante, MD  Brief Narrative 62 year old woman with past medical history significant for hyperlipidemia, hypertension, obesity, osteoarthritis (status post bilateral knee replacement)andGERD;who presented to the emergency department secondary to dizziness, nausea and vomiting. Patient reports symptoms started suddenlyaround 11 PM (the night prior to admission). She felt the room spinning around her, with associated nausea, vomiting and generalized weakness. She denies chest pain, palpitations, headaches, abdominal pain, coughing, shortness of breath, fever/chills or any other acute complaints.  Of note, no prior history ofvertigo reported.  ED Course:Valium and meclizine given some blood work unremarkable and CT scan of the head negative for acute abnormalities. Given patient's continue symptoms (especially ongoing dizziness) [facility] been called to bring patient into the hospital forfurther evaluation and treatment. 09/27/2017- Patient tolerated diet without any vomiting.continues to have vertigo. dw neuro on call on phone.  Recommended to obtain CT angiogram of the neck prior to discharge.   Assessment & Plan:   Principal Problem:   Vertigo Active Problems:   Hypertension   Carotid artery narrowing   GERD (gastroesophageal reflux disease)   HLD (hyperlipidemia)   Obesity BPV-NEEDS ENT Eval as OP.Continue zofran and antivert.follow up with neuro dr Krista Blue.MRA ?congenital moderate narrowing of vert art?continue aspirin and statins.ct angio neck today.pt eval.case manger to help find vestibular therapy program.       DVT prophylaxis:lovenox Code Status full Family Communication:none Disposition Plan: hope to dc tomorrow if wu negative  Consultants: neuro by phone  Procedures:none Antimicrobials:none  Subjective:co dizzy,no vomiting  Objective: Vitals:   09/26/17 1847 09/26/17  2046 09/27/17 0600 09/27/17 1358  BP: (!) 96/56 (!) 109/49 (!) 148/66 (!) 125/55  Pulse: 79 72 66 64  Resp:  16 14   Temp:  98.4 F (36.9 C) 97.9 F (36.6 C) 98.8 F (37.1 C)  TempSrc:  Oral Oral Oral  SpO2: 95% 97% 97% 96%  Weight:      Height:        Intake/Output Summary (Last 24 hours) at 09/27/2017 1603 Last data filed at 09/27/2017 1400 Gross per 24 hour  Intake 960 ml  Output 625 ml  Net 335 ml   Filed Weights   09/25/17 0903 09/25/17 0948  Weight: 90.7 kg (200 lb) 90.7 kg (200 lb)    Examination:  General exam: Appears calm and comfortable  Respiratory system: Clear to auscultation. Respiratory effort normal. Cardiovascular system: S1 & S2 heard, RRR. No JVD, murmurs, rubs, gallops or clicks. No pedal edema. Gastrointestinal system: Abdomen is nondistended, soft and nontender. No organomegaly or masses felt. Normal bowel sounds heard. Central nervous system: Alert and oriented. No focal neurological deficits. Extremities: Symmetric 5 x 5 power. Skin: No rashes, lesions or ulcers Psychiatry: Judgement and insight appear normal. Mood & affect appropriate.     Data Reviewed: I have personally reviewed following labs and imaging studies  CBC: Recent Labs  Lab 09/25/17 0125 09/25/17 1011 09/26/17 0438  WBC 8.5 7.8 6.2  HGB 13.9 12.8 12.4  HCT 40.9 37.7 36.5  MCV 97.1 97.7 98.6  PLT 184 214 408   Basic Metabolic Panel: Recent Labs  Lab 09/25/17 0125 09/25/17 1011 09/26/17 0438  NA 138  --  140  K 3.7  --  4.0  CL 104  --  108  CO2 27  --  27  GLUCOSE 130*  --  99  BUN 23*  --  16  CREATININE 0.82 0.69 0.79  CALCIUM 9.4  --  9.3  MG  --  1.9  --   PHOS  --  3.2  --    GFR: Estimated Creatinine Clearance: 81.7 mL/min (by C-G formula based on SCr of 0.79 mg/dL). Liver Function Tests: Recent Labs  Lab 09/25/17 0125  AST 31  ALT 22  ALKPHOS 59  BILITOT 0.6  PROT 6.3*  ALBUMIN 3.6   Recent Labs  Lab 09/25/17 0125  LIPASE 25   No results  for input(s): AMMONIA in the last 168 hours. Coagulation Profile: No results for input(s): INR, PROTIME in the last 168 hours. Cardiac Enzymes: No results for input(s): CKTOTAL, CKMB, CKMBINDEX, TROPONINI in the last 168 hours. BNP (last 3 results) No results for input(s): PROBNP in the last 8760 hours. HbA1C: Recent Labs    09/25/17 1011  HGBA1C 5.8*   CBG: No results for input(s): GLUCAP in the last 168 hours. Lipid Profile: No results for input(s): CHOL, HDL, LDLCALC, TRIG, CHOLHDL, LDLDIRECT in the last 72 hours. Thyroid Function Tests: Recent Labs    09/25/17 1011  TSH 1.135   Anemia Panel: No results for input(s): VITAMINB12, FOLATE, FERRITIN, TIBC, IRON, RETICCTPCT in the last 72 hours. Sepsis Labs: No results for input(s): PROCALCITON, LATICACIDVEN in the last 168 hours.  No results found for this or any previous visit (from the past 240 hour(s)).       Radiology Studies: No results found.      Scheduled Meds: . aspirin EC  81 mg Oral Daily  . azaTHIOprine  50 mg Oral QHS  . calcium carbonate  3 tablet Oral Q lunch  . carvedilol  25 mg Oral BID WC  . cholecalciferol  2,000 Units Oral Daily  . docusate sodium  100 mg Oral Daily  . folic acid  1 mg Oral Daily  . gabapentin  300 mg Oral TID  . heparin  5,000 Units Subcutaneous Q8H  . meclizine  25 mg Oral TID  . pantoprazole  40 mg Oral Daily  . pravastatin  20 mg Oral QPM  . sodium chloride flush  3 mL Intravenous Q12H   Continuous Infusions:   LOS: 0 days        Georgette Shell, MD [facility]  If 7PM-7AM, please contact night-coverage [email or web address] Password [email or web address] 09/27/2017, 4:03 PM

## 2017-09-28 DIAGNOSIS — R42 Dizziness and giddiness: Secondary | ICD-10-CM | POA: Diagnosis not present

## 2017-10-01 ENCOUNTER — Telehealth: Payer: Self-pay | Admitting: Neurology

## 2017-10-01 NOTE — Telephone Encounter (Signed)
Pt was just put into the hospital for back pain and vertigo. The Dr at the hospital told her to see [facility] asap. Please call to discuss

## 2017-10-01 NOTE — Telephone Encounter (Signed)
Returned call - she has been scheduled to see Dr. Krista Blue on 10/03/17.

## 2017-10-03 ENCOUNTER — Ambulatory Visit: Payer: Medicare Other | Admitting: Neurology

## 2017-10-03 ENCOUNTER — Other Ambulatory Visit (INDEPENDENT_AMBULATORY_CARE_PROVIDER_SITE_OTHER): Payer: Self-pay

## 2017-10-03 ENCOUNTER — Encounter: Payer: Self-pay | Admitting: Neurology

## 2017-10-03 VITALS — BP 145/76 | HR 67 | Ht 66.0 in | Wt 203.5 lb

## 2017-10-03 DIAGNOSIS — H8111 Benign paroxysmal vertigo, right ear: Secondary | ICD-10-CM

## 2017-10-03 DIAGNOSIS — H8110 Benign paroxysmal vertigo, unspecified ear: Secondary | ICD-10-CM | POA: Insufficient documentation

## 2017-10-03 MED ORDER — GABAPENTIN 100 MG PO CAPS: 300.0000 mg | ORAL_CAPSULE | Freq: Three times a day (TID) | ORAL | 11 refills | Status: DC

## 2017-10-03 MED ORDER — MECLIZINE HCL 25 MG PO TABS: 25.0000 mg | ORAL_TABLET | Freq: Three times a day (TID) | ORAL | 6 refills | Status: DC

## 2017-10-03 NOTE — Progress Notes (Signed)
  PATIENT: [name] DOB: 09/21/1956  REASON FOR VISIT: follow up- memory, optic neuritis, radiculopathy HISTORY FROM: patient  HISTORY OF PRESENT ILLNESS: HISTORY per Dr. Katelee Schupp notes: [name] is a 62 years old right-handed female, seen in refer by her primary care physician, for evaluation of right visual distortion, possible right optic neuritis in July 22 2015.  She had a history of right retinal detachment in 2002, in early October 2016, she noticed blurry vision, color washing out, by July 10 2015, she realized when she close her left eye, she saw dark clusters of gray discoloration in her right visual field,  She was sent to the emergency room for evaluation, was diagnosed with right optic neuritis, stayed in hospital for IV Solu-Medrol 1000 mg for 4 days.  She reported over the past few years, she had intermittent left hand numbness, muscle cramping, but was never evaluated, she also complains bilateral lower extremity stiffness, give out underneath her sometimes. Extreme fatigue, she used to be active, retired schoolteacher, now she has difficulty mowing her yard because her severe fatigue.  She was also recently evaluated by [facility] ophthalmologist Dr. Rajiv Shah, confirmed her diagnosis of right optic neuritis , she also has a history of bilateral posterior vitreous detachment, hypertensive retinopathy of both eyes, she was given prednisone tapering dose, overall her vision has improved, but not back to her baseline  I have personally reviewed MRI of the brain with and without contrast October 2015 with patient, multi supratentorium lesions, few lesions are oval-shaped, oriented towards ventricle, no contrast enhancement. This certainly raises the possibility of relapsing remitting multiple sclerosis versus small vessel disease.  She also reported previous history of brain injury, basketball bad hit her head before, falling down 13 steps stairs  in the  past  Laboratory showed mild elevated glucose on BMP, normal CBC, Lyme titer, ESR was elevated 35  UPDATE Aug 10 2015:  I have personally reviewed MRI of the cervical in November third 2016 with patient: Severe spinal stenosis at C5-C6 with an AP diameter of 5.6 mm. There is subtle T2 hyperintense signal within the spinal cord below the point of maximal stenosis that could represent myelopathic changes associated with possible cord compression. Mild spinal stenosis at C6-C7 that does not lead to any spinal cord changes or nerve root compression.  Patient's right eye vision has significant improvement, about 80% back to baseline.  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 She also complains of nighttime snoring, excessive daytime sleepiness, fatigue during the daytime, ESS score is 10  UPDATE Oct 19 2015: She has developed moon face, she is now taking prednisone 30mg, prescribed by [facility] ophthalmologist Dr.Shah, she was given prednisone 60 mg daily since July 19 2015, only started tapering recently, she was also started on methotrexate 2.5 milligrams, 4 tablets once a week, along with folic acid since October 12 2015, She is now on Fosamax,  She continue complains of intermittent blurry vision involving both eye, she has complicated eye issues, bilateral cataract, posterior vitreous detachment, hypertensive retinopathy, right optic neuritis, right retinal detachment, status post scleral buckle/cryo in the past, laboratory evaluation in her January visit showed no evidence of systemic inflammatory process  She will have anterior approach cervical decompression surgery in November 02 2015.  She is now having increased pain at her   both hips, she also has low back pain, getting worse since Jan 2017, she has mild  gait difficulty also related to her bilateral hip pain, she denies bowel and bladder incontinence, generalized fatigue, even bilateral upper extremity weighted down  I have reviewed ophthalmologist note and called her ophthalmologist Dr. Manuella Ghazi, we agreed on quickly tapering off her steroid dosage, keep methotrexate 2.4 milligrams 4 tablets each week   Update December 07 2015: She had anterior cervical approach decompression and fusion of C5-6, C6-7 in November 02 2015, she complained of diffuse body achy pain, shortness of breath with minimum exertion  She has been complaining of worsening low back pain since January 2017, used to radiate to both hip, lower extremity, right leg has much improved, but she still has significant left hip pain, especially after bearing weight, she has increased nocturia,  We have personally reviewed x-ray of right and left hip, there was no significant pathology.  MRI of lumbar February 2017: Multilevel degenerative changes, most severe at L4-5: disc bulging and facet hypertrophy with moderate-severe spinal stenosis and mild biforaminal narrowing. L3-4: disc bulging and facet hypertrophy with mild-moderate spinal stenosis and no foraminal narrowing. Additional multi-level facet hypertrophy and disc bulging as above. Compared to MRI on 11/08/10, there has been some progressive worsening of degenerative spine disease and spinal stenosis.  The MRI findings, especially moderate to severe L4-5 spinal canal stenosis, and moderate to severe bilateral foraminal stenosis explains her complains of worsening low back pain, radiating pain to left lower extremity and gait difficulty, she is scheduled to have epidural injection in next few days   UPDATE May 16 2016: She is scheduled to have left cataract in June 08 2016,  She recovered well from cervical decompression surgery in February, no longer has significant neck pain, her low back pain is well controlled by epidural  injection, last injection was around May 2017,  She is concerned about her memory loss, word finding difficulty, forgot to clock in for her work, got confused while driving.  I personally reviewed CT head in Oct 2016, that was normal.  She has stopped taking gabapentin for concerning of blurry vision, it is hard for her to drive,  She reported normal laboratory evaluations by her primary care doctor Dr. Abner Greenspan including B12, TSH. Her mother suffered memory loss in her elderly age at age 19, her 2 maternal aunt suffered Alzheimer's disease.     UPDATE May 21 2017: I reviewed Dr. Trena Platt record in May 2018, right retinal detachment, optic neuritis Macular OU, hypertensive retinopathy on both eyes, she was diagnosed with exudative macular degeneration of right eye, had  new subretinal choroidal neurovascular membrane, she has Avastin of right eye on April 24 2017.  She is taking Imuran '50mg'$  qhs now, complains of GI side effect with higher dose,   Also reviewed her blood pressure record over the last week, the blood pressure was in the range of 84-124/51-76  She also complains of frequent vortex headaches, most of headache is at the middle of night, she did not complains of excessive daytimes sleepiness or fatigue.   I reviewed the sleep study in 2016 there was a concern of obstructive sleep apnea, she was recommended with sleep study with potential positive air pressure titration,  She used the wrong words, worsening memory loss,   I reviewed the laboratory evaluation in May 14 2017, mild elevated glucose 111, creatinine was normal 0.8, mild elevated WBC 11.2, normal TSH,   UPDATE Oct 03 2017:  She had sudden onset dizziness September 24, 2017, woke up from overnight sleep after using bathroom, when leaning over trying to get into bed, she had sudden onset vertigo, unsteady gait, was taken by EMS to the emergency room had nausea and vomiting on her way  Had extensive  evaluation, MRI of the brain on September 25, 2017 showed mild chronic small vessel disease no acute abnormality, normal MRA of the brain.  CT angiogram in January 2019 showed right internal carotid artery bifurcation fibrofatty plaque with 30-40% stenosis,  Echocardiogram July 2017, ejection fraction 60-65%  Laboratory evaluations A1c 5.8, CBC showed hemoglobin of 12.4, normal TSH, magnesium, phosphate, HIV,  Continue have sudden onset vertigo with sudden positional change, especially turning to the right side, there was no significant orthostatic blood pressure change at today's examination.  She is scheduled to have right knee replacement in 1 week, REVIEW OF SYSTEMS: Out of a complete 14 system review of symptoms, the patient complains only of the following symptoms, and all other reviewed systems are negative.  Loss of vision, blurred vision, weight change, leg swelling, joint pain, back pain, walking difficulty, weakness, memory loss  ALLERGIES: No Known Allergies  HOME MEDICATIONS: Outpatient Medications Prior to Visit  Medication Sig Dispense Refill  . aspirin EC 81 MG tablet Take 81 mg by mouth daily.     Marland Kitchen azaTHIOprine (IMURAN) 50 MG tablet Take 1 tablet (50 mg total) by mouth daily. (Patient taking differently: Take 50 mg by mouth at bedtime. ) 90 tablet 4  . calcium carbonate (OSCAL) 1500 (600 Ca) MG TABS tablet Take 1,500 mg by mouth daily.    . carvedilol (COREG) 25 MG tablet TAKE 1 TABLET (25 MG TOTAL) BY MOUTH 2 (TWO) TIMES DAILY WITH A MEAL. 60 tablet 11  . Cholecalciferol (VITAMIN D) 2000 units CAPS Take 2,000 Units by mouth daily.    Marland Kitchen docusate sodium (COLACE) 100 MG capsule Take 100 mg by mouth daily.     . folic acid (FOLVITE) 1 MG tablet Take 1 mg by mouth daily.     Marland Kitchen gabapentin (NEURONTIN) 100 MG capsule TAKE 3 CAPSULES (300 MG TOTAL) BY MOUTH 3 (THREE) TIMES DAILY. 270 capsule 11  . meclizine (ANTIVERT) 25 MG tablet Take 1 tablet (25 mg total) by mouth 3 (three)  times daily. 30 tablet 0  . nitroGLYCERIN (NITROSTAT) 0.4 MG SL tablet Place 0.4 mg under the tongue every 5 (five) minutes as needed for chest pain.     Marland Kitchen omeprazole (PRILOSEC) 20 MG capsule Take 20 mg by mouth daily.   8  . ondansetron (ZOFRAN) 4 MG tablet Take 1 tablet (4 mg total) by mouth every 6 (six) hours as needed for nausea. 20 tablet 0  . OVER THE COUNTER MEDICATION Take 1 tablet by mouth daily. Vision Gold    . pravastatin (PRAVACHOL) 20 MG tablet Take 20 mg by mouth every evening.  6   No facility-administered medications prior to visit.     PAST MEDICAL HISTORY: Past Medical History:  Diagnosis Date  . Arthritis   . Deviated septum   . Fibroid tumor   . GERD (gastroesophageal reflux disease)   . Hepatitis    AS CHILD    . Hypercholesteremia   . Hypertension   . Memory loss   . Meniscus tear   . MRSA infection   . Retinal detachment   . Retinal detachment   . Spinal stenosis     PAST SURGICAL HISTORY: Past Surgical History:  Procedure Laterality Date  . ANTERIOR CERVICAL DECOMP/DISCECTOMY FUSION N/A 11/02/2015   Procedure: ANTERIOR CERVICAL DECOMPRESSION FUSION CERVICAL FIVE-SIX,CERVICAL SIX-SEVEN;  Surgeon: Karie Chimera, MD;  Location: [facility];  Service: Neurosurgery;  Laterality: N/A;  right side approach  . CATARACT EXTRACTION Right   . fibroid tumor    . KNEE SURGERY Bilateral   . RETINAL DETACHMENT SURGERY Right     FAMILY HISTORY: Family History  Problem Relation Age of Onset  . Stroke Mother   . Arrhythmia Mother   . Bladder Cancer Mother   . Thyroid disease Mother   . Heart disease Father   . Heart attack Father   . Stroke Maternal Grandfather   . Stroke Maternal Grandmother     SOCIAL HISTORY: Social History   Socioeconomic History  . Marital status: Single    Spouse name: Not on file  . Number of children: 0  . Years of education: Masters  . Highest education level: Not on file  Social Needs  . Financial resource strain: Not on  file  . Food insecurity - worry: Not on file  . Food insecurity - inability: Not on file  . Transportation needs - medical: Not on file  . Transportation needs - non-medical: Not on file  Occupational History  . Occupation: caregiver    Comment: Part-time  Tobacco Use  . Smoking status: Never Smoker  . Smokeless tobacco: Never Used  Substance and Sexual Activity  . Alcohol use: No  . Drug use: No  . Sexual activity: Not on file  Other Topics Concern  . Not on file  Social History Narrative   Lives at home with her sister.   Right-handed.   1 cup coffee daily.      PHYSICAL EXAM  Vitals:   10/03/17 1358  BP: (!) 145/76  Pulse: 67  Weight: 203 lb 8 oz (92.3 kg)  Height: '5\' 6"'$  (1.676 m)   Body mass index is 32.85 kg/m.    MMSE - Mini Mental State Exam 05/21/2017 11/20/2016 05/16/2016  Orientation to time '5 5 5  '$ Orientation to Place '5 5 5  '$ Registration '3 3 3  '$ Attention/ Calculation '5 5 5  '$ Recall '3 3 3  '$ Language- name 2 objects '2 2 2  '$ Language- repeat '1 1 1  '$ Language- follow 3 step command '3 3 3  '$ Language- read & follow direction '1 1 1  '$ Write a sentence '1 1 1  '$ Copy design '1 1 1  '$ Total score '30 30 30  '$ animal naming 15   PHYSICAL EXAMNIATION:  Gen: NAD, conversant, well nourised, obese, well groomed                     Cardiovascular: Regular rate rhythm, no peripheral edema, warm, nontender. Eyes: Conjunctivae clear without exudates or hemorrhage Neck: Supple, no carotid bruits. Pulmonary: Clear to auscultation bilaterally   NEUROLOGICAL EXAM:  MENTAL STATUS: Speech:    Speech is normal; fluent and spontaneous with normal comprehension.  Cognition:     Orientation to time, place and person     Normal recent and remote memory     Normal Attention span and concentration     Normal Language, naming, repeating,spontaneous speech     Fund of knowledge   CRANIAL NERVES: CN II: Visual fields are full to confrontation. Fundoscopic exam is normal with sharp  discs and no vascular changes. Pupils are round equal and briskly reactive to light. CN III, IV, VI: extraocular movement are  normal. No ptosis. CN V: Facial sensation is intact to pinprick in all 3 divisions bilaterally. Corneal responses are intact.  CN VII: Face is symmetric with normal eye closure and smile. CN VIII: Hearing is normal to rubbing fingers CN IX, X: Palate elevates symmetrically. Phonation is normal. CN XI: Head turning and shoulder shrug are intact CN XII: Tongue is midline with normal movements and no atrophy.  MOTOR: There is no pronator drift of out-stretched arms. Muscle bulk and tone are normal. Muscle strength is normal.  REFLEXES: Reflexes are 2+ and symmetric at the biceps, triceps, knees, and ankles. Plantar responses are flexor.  SENSORY: Intact to light touch, pinprick, positional and vibratory sensation are intact in fingers and toes.  COORDINATION: Rapid alternating movements and fine finger movements are intact. There is no dysmetria on finger-to-nose and heel-knee-shin.    GAIT/STANCE: Rely on her walker, antalgic  Apley's maneuver, right ear dependent position trigger intense vertigo, rotatory nystagmus, habituated quickly,    DIAGNOSTIC DATA (LABS, IMAGING, TESTING) - I reviewed patient records, labs, notes, testing and imaging myself where available.  Lab Results  Component Value Date   WBC 6.2 09/26/2017   HGB 12.4 09/26/2017   HCT 36.5 09/26/2017   MCV 98.6 09/26/2017   PLT 202 09/26/2017      Component Value Date/Time   NA 140 09/26/2017 0438   K 4.0 09/26/2017 0438   CL 108 09/26/2017 0438   CO2 27 09/26/2017 0438   GLUCOSE 99 09/26/2017 0438   BUN 16 09/26/2017 0438   CREATININE 0.79 09/26/2017 0438   CALCIUM 9.3 09/26/2017 0438   PROT 6.3 (L) 09/25/2017 0125   ALBUMIN 3.6 09/25/2017 0125   AST 31 09/25/2017 0125   ALT 22 09/25/2017 0125   ALKPHOS 59 09/25/2017 0125   BILITOT 0.6 09/25/2017 0125   GFRNONAA >60 09/26/2017  0438   GFRAA >60 09/26/2017 0438      ASSESSMENT AND PLAN 62 y.o. year old female  Her complains of generalized fatigue  Can be related to her deconditioning, low blood pressure,  Sudden onset vertigo,  Most consistent with benign positional vertigo, likely related to the right posterior semicircular canal   continue repositioning maneuver  No evidence of cerebellum/brainstem stroke  Refer to vestibular rehab  Marcial Pacas, M.D. Ph.D.  [facility] [place], [place] [address] Phone: [phone] Fax:      [phone]

## 2017-10-03 NOTE — Patient Instructions (Signed)

## 2017-10-04 ENCOUNTER — Telehealth (INDEPENDENT_AMBULATORY_CARE_PROVIDER_SITE_OTHER): Payer: Self-pay | Admitting: Orthopaedic Surgery

## 2017-10-04 DIAGNOSIS — M179 Osteoarthritis of knee, unspecified: Secondary | ICD-10-CM | POA: Insufficient documentation

## 2017-10-04 NOTE — Telephone Encounter (Signed)
Pt is sched for surgery and need to resched due to her vertigo. Pt had ER visit and her PCP recommeds pt  waiting at least three weeks. Please call pt to discuss resched surgery.

## 2017-10-04 NOTE — Telephone Encounter (Signed)
See message below °

## 2017-10-09 MED ORDER — TRANEXAMIC ACID 1000 MG/10ML IV SOLN
1000.0000 mg | INTRAVENOUS | Status: AC
  Filled 2017-10-09: qty 10

## 2017-10-09 NOTE — Telephone Encounter (Signed)
I called patient and left a voice mail for a return call. 

## 2017-10-10 NOTE — Telephone Encounter (Signed)
Spoke with patient and rescheduled surgery to 11/08/17.

## 2017-10-11 DIAGNOSIS — H903 Sensorineural hearing loss, bilateral: Secondary | ICD-10-CM | POA: Insufficient documentation

## 2017-10-23 ENCOUNTER — Inpatient Hospital Stay (INDEPENDENT_AMBULATORY_CARE_PROVIDER_SITE_OTHER): Payer: Medicare Other | Admitting: Orthopaedic Surgery

## 2017-10-26 NOTE — Pre-Procedure Instructions (Signed)
KIERAN NACHTIGAL  10/26/2017      [id number] [address], [place] [address] Phone: [phone] Fax: [phone]    Your procedure is scheduled on November 08, 2017.  Report to [facility] Admitting at [phone] AM.  Call this number if you have problems the morning of surgery:  [phone]   Remember:  Do not eat food or drink liquids after midnight.  Take these medicines the morning of surgery with A SIP OF WATER carvedilol (coreg), gabapentin (neurontin), meclizine (antivert), omeprazole (prilosec), ondansetron (zofran)-if needed for nausea.  7 days prior to surgery STOP taking any Aspirin (unless otherwise instructed by your surgeon), Aleve, Naproxen, Ibuprofen, Motrin, Advil, Goody's, BC's, all herbal medications, fish oil, and all vitamins  Continue all other medications as instructed by your physician except follow the above medication instructions before surgery   Do not wear jewelry, make-up or nail polish.  Do not wear lotions, powders, or perfumes, or deodorant.  Do not shave 48 hours prior to surgery.    Do not bring valuables to the hospital.  [facility] is not responsible for any belongings or valuables.  Contacts, dentures or bridgework may not be worn into surgery.  Leave your suitcase in the car.  After surgery it may be brought to your room.  For patients admitted to the hospital, discharge time will be determined by your treatment team.  Patients discharged the day of surgery will not be allowed to drive home.   Special instructions:   [facility]- Preparing For Surgery  Before surgery, you can play an important role. Because skin is not sterile, your skin needs to be as free of germs as possible. You can reduce the number of germs on your skin by washing with CHG (chlorahexidine gluconate) Soap before surgery.  CHG is an antiseptic cleaner which kills germs and bonds with the skin to continue  killing germs even after washing.  Please do not use if you have an allergy to CHG or antibacterial soaps. If your skin becomes reddened/irritated stop using the CHG.  Do not shave (including legs and underarms) for at least 48 hours prior to first CHG shower. It is OK to shave your face.  Please follow these instructions carefully.   1. Shower the NIGHT BEFORE SURGERY and the MORNING OF SURGERY with CHG.   2. If you chose to wash your hair, wash your hair first as usual with your normal shampoo.  3. After you shampoo, rinse your hair and body thoroughly to remove the shampoo.  4. Use CHG as you would any other liquid soap. You can apply CHG directly to the skin and wash gently with a scrungie or a clean washcloth.   5. Apply the CHG Soap to your body ONLY FROM THE NECK DOWN.  Do not use on open wounds or open sores. Avoid contact with your eyes, ears, mouth and genitals (private parts). Wash Face and genitals (private parts)  with your normal soap.  6. Wash thoroughly, paying special attention to the area where your surgery will be performed.  7. Thoroughly rinse your body with warm water from the neck down.  8. DO NOT shower/wash with your normal soap after using and rinsing off the CHG Soap.  9. Pat yourself dry with a CLEAN TOWEL.  10. Wear CLEAN PAJAMAS to bed the night before surgery, wear comfortable clothes the morning of surgery  11. Place CLEAN SHEETS on  your bed the night of your first shower and DO NOT SLEEP WITH PETS.  Day of Surgery: Do not apply any deodorants/lotions. Please wear clean clothes to the hospital/surgery center.    Please read over the following fact sheets that you were given. Pain Booklet, Coughing and Deep Breathing, MRSA Information and Surgical Site Infection Prevention

## 2017-10-29 ENCOUNTER — Other Ambulatory Visit: Payer: Self-pay

## 2017-10-29 ENCOUNTER — Encounter (HOSPITAL_COMMUNITY)
Admission: RE | Admit: 2017-10-29 | Discharge: 2017-10-29 | Disposition: A | Discharge status: Home / Self Care | Payer: Medicare Other | Source: Ambulatory Visit | Attending: Orthopaedic Surgery | Admitting: Orthopaedic Surgery

## 2017-10-29 ENCOUNTER — Encounter (HOSPITAL_COMMUNITY): Payer: Self-pay

## 2017-10-29 ENCOUNTER — Ambulatory Visit (HOSPITAL_COMMUNITY)
Admission: RE | Admit: 2017-10-29 | Discharge: 2017-10-29 | Disposition: A | Discharge status: Home / Self Care | Payer: Medicare Other | Source: Ambulatory Visit | Attending: Physician Assistant | Admitting: Physician Assistant

## 2017-10-29 DIAGNOSIS — Z01818 Encounter for other preprocedural examination: Secondary | ICD-10-CM | POA: Diagnosis not present

## 2017-10-29 DIAGNOSIS — M1711 Unilateral primary osteoarthritis, right knee: Secondary | ICD-10-CM | POA: Insufficient documentation

## 2017-10-29 DIAGNOSIS — I2584 Coronary atherosclerosis due to calcified coronary lesion: Secondary | ICD-10-CM | POA: Diagnosis not present

## 2017-10-29 DIAGNOSIS — I2510 Atherosclerotic heart disease of native coronary artery without angina pectoris: Secondary | ICD-10-CM | POA: Insufficient documentation

## 2017-10-29 DIAGNOSIS — Z01812 Encounter for preprocedural laboratory examination: Secondary | ICD-10-CM | POA: Insufficient documentation

## 2017-10-29 HISTORY — DX: Dyspnea, unspecified: R06.00

## 2017-10-29 HISTORY — DX: Frequency of micturition: R35.0

## 2017-10-29 HISTORY — DX: Anxiety disorder, unspecified: F41.9

## 2017-10-29 HISTORY — DX: Nausea with vomiting, unspecified: R11.2

## 2017-10-29 HISTORY — DX: Other specified postprocedural states: Z98.890

## 2017-10-29 HISTORY — DX: Other specified postprocedural states: R11.2

## 2017-10-29 LAB — COMPREHENSIVE METABOLIC PANEL
ALT: 37 U/L (ref 14–54)
AST: 34 U/L (ref 15–41)
Albumin: 3.8 g/dL (ref 3.5–5.0)
Alkaline Phosphatase: 57 U/L (ref 38–126)
Anion gap: 12 (ref 5–15)
BILIRUBIN TOTAL: 0.6 mg/dL (ref 0.3–1.2)
BUN: 20 mg/dL (ref 6–20)
CO2: 22 mmol/L (ref 22–32)
Calcium: 9.4 mg/dL (ref 8.9–10.3)
Chloride: 105 mmol/L (ref 101–111)
Creatinine, Ser: 0.81 mg/dL (ref 0.44–1.00)
Glucose, Bld: 108 mg/dL — ABNORMAL HIGH (ref 65–99)
POTASSIUM: 4.4 mmol/L (ref 3.5–5.1)
Sodium: 139 mmol/L (ref 135–145)
TOTAL PROTEIN: 6.4 g/dL — AB (ref 6.5–8.1)

## 2017-10-29 LAB — CBC WITH DIFFERENTIAL/PLATELET
Basophils Absolute: 0 10*3/uL (ref 0.0–0.1)
Basophils Relative: 1 %
EOS PCT: 4 %
Eosinophils Absolute: 0.2 10*3/uL (ref 0.0–0.7)
HCT: 40.6 % (ref 36.0–46.0)
Hemoglobin: 13.4 g/dL (ref 12.0–15.0)
LYMPHS ABS: 1.9 10*3/uL (ref 0.7–4.0)
Lymphocytes Relative: 29 %
MCH: 32.7 pg (ref 26.0–34.0)
MCHC: 33 g/dL (ref 30.0–36.0)
MCV: 99 fL (ref 78.0–100.0)
MONO ABS: 0.4 10*3/uL (ref 0.1–1.0)
MONOS PCT: 6 %
Neutro Abs: 4 10*3/uL (ref 1.7–7.7)
Neutrophils Relative %: 60 %
PLATELETS: 228 10*3/uL (ref 150–400)
RBC: 4.1 MIL/uL (ref 3.87–5.11)
RDW: 14 % (ref 11.5–15.5)
WBC: 6.6 10*3/uL (ref 4.0–10.5)

## 2017-10-29 LAB — TYPE AND SCREEN
ABO/RH(D): A POS
Antibody Screen: NEGATIVE

## 2017-10-29 LAB — ABO/RH: ABO/RH(D): A POS

## 2017-10-29 LAB — APTT: aPTT: 28 seconds (ref 24–36)

## 2017-10-29 LAB — PROTIME-INR
INR: 1.01
PROTHROMBIN TIME: 13.2 s (ref 11.4–15.2)

## 2017-10-29 LAB — SURGICAL PCR SCREEN
MRSA, PCR: NEGATIVE
STAPHYLOCOCCUS AUREUS: NEGATIVE

## 2017-10-29 NOTE — Progress Notes (Signed)
Recent hospitalization @ [facility] for vertigo sudden onset  Have not determined cause  Pt. States that she has not had any episodes discharge from hospital.

## 2017-10-31 NOTE — Progress Notes (Signed)
Anesthesia Chart Review: Patient is a 62 year old scheduled for right TKA on 11/08/17 by Dr.  Georga Kaufmann "Eduard Roux. Surgery was initially scheduled for last month, but reportedly her PCP recommended postponing for at least three weeks given 1/1-09/27/17 hospitalization for vertigo. Work-up was negative for CVA. Neurology felt symptoms most consistent with benign paroxysmal positional vertigo (BPPV).  History includes never smoker, post-operative N/V, HTN, hypercholesterolemia, GERD, arthritis, memory loss, MRSA, exertional dyspnea, anxiety, right optic neuritis 06/2015, childhood hepatitis, retinal detachment surgery, fibroid tumor surgery, spinal stenosis, C5-7 ACDF 11/02/15, BPPV 09/2017. BMI is consistent with obesity.   - PCP is listed as Dr. Crist Infante. - Neurologist is Dr. Marcial Pacas. Last visit 10/03/17 following hospitalization for sudden onset vertigo with N/V felt most consistent with BPPV. She referred patient to vestibular rehab. - Cardiologist is Dr. Candee Furbish. He saw her on 03/24/16 as a new patient for evaluation of SOB and fatigue. She had a negative CTA for PE 02/10/16, but did show coronary calcifications. Father with MI history at age 16. Nuclear stress test and echo were ordered and follow-up would be based on results. She had a "reassuring" echo. Her stress test showed small anteroseptal defect (may represent RV insertion point or possible ischemia for small diagonal branch). Medical therapy was advised.   Meds include aspirin 81 mg (hold 7 days unless otherwise instructed by surgeon), Imuran, Neurontin, Os-Cal, Coreg, folic acid, Neurontin, nitro, Prilosec, Zofran, pravastatin.  BP (!) 168/72   Pulse 62   Temp 36.7 C   Resp 20   Ht 5\' 6"  (1.676 m)   Wt 203 lb 1.6 oz (92.1 kg)   SpO2 96%   BMI 32.78 kg/m   EKG 09/25/17: SR, borderline RAD.  Echo 04/19/16: Study Conclusions - Left ventricle: The cavity size was normal. There was mild   concentric hypertrophy. Systolic function was  normal. The   estimated ejection fraction was in the range of 60% to 65%. Wall   motion was normal; there were no regional wall motion   abnormalities. Left ventricular diastolic function parameters   were normal. - Atrial septum: No defect or patent foramen ovale was identified.  Nuclear stress test 04/19/16:  Nuclear stress EF: 65%. Normal wall motion  There was no ST segment deviation noted during stress.  Defect 1: There is a small defect of moderate severity present in the mid anteroseptal location, partially reversible. This may represent RV insertion point or a very small amount of localized ischemia possibly small diagonal branch.  This is a overall a low risk study.  Results reviewed by Dr. Marlou Porch. He wrote, "Advise continuing with medical therapy. There is no large area of ischemia. Overall reassuring ejection fraction. No signs of cardiomyopathy. Low risk stress test."  CTA neck 09/27/17: IMPRESSION: 1. Patent carotid and vertebral arteries. No dissection, aneurysm, or significant stenosis by NASCET criteria. 2. Right carotid bifurcation fibrofatty plaque with mild 30-40% stenosis of distal common carotid and proximal internal carotid arteries.  Carotid U/S 07/12/15: Summary: - The vertebral arteries appear patent with antegrade flow. - Findings consistent with 1-39% stenosis involving the right internal carotid artery and the left internal carotid artery. - >50% Stenosis involving the right external carotid artery. - ICA/CCA ratio. right = 2.04. left = 1.32.  CXR 10/29/17: IMPRESSION: There is no active cardiopulmonary disease. Please note coronary artery calcifications were observed on the May 2017 CT scan.  MRI/MRA head 09/25/17: IMPRESSION: Image quality degraded by moderate motion Negative for acute infarct. Mild  chronic microvascular ischemic change in the white matter Negative MRA head  Preoperative labs noted. Cr 0.81. Glucose 108. LFTS WNL. CBC, PT/PTT WNL.  T&S done.  No CV symptoms documented at PAT visit. Recent neurology evaluation after vertigo admission. No evidence of CVA on MRI. Low risk stress test in 2017. Based on currently available information, I would anticipate that she can proceed as planned if no acute changes. Reviewed cardiac studies with anesthesiologist Dr. Oleta Mouse.   George Hugh [facility]/Anesthesiology Phone [phone] 10/31/2017 3:54 PM

## 2017-11-05 ENCOUNTER — Other Ambulatory Visit (INDEPENDENT_AMBULATORY_CARE_PROVIDER_SITE_OTHER): Payer: Self-pay

## 2017-11-07 MED ORDER — CEFAZOLIN SODIUM-DEXTROSE 2-4 GM/100ML-% IV SOLN
2.0000 g | INTRAVENOUS | Status: AC
  Administered 2017-11-08: 2 g via INTRAVENOUS
  Filled 2017-11-07: qty 100

## 2017-11-07 MED ORDER — LACTATED RINGERS IV SOLN
INTRAVENOUS | Status: DC
  Administered 2017-11-08: 11:00:00 via INTRAVENOUS

## 2017-11-08 ENCOUNTER — Inpatient Hospital Stay (HOSPITAL_COMMUNITY)
Admission: AD | Admit: 2017-11-08 | Discharge: 2017-11-10 | DRG: 470 | Disposition: A | Discharge status: Home Health | Payer: Medicare Other | Source: Ambulatory Visit | Attending: Orthopaedic Surgery | Admitting: Orthopaedic Surgery

## 2017-11-08 ENCOUNTER — Ambulatory Visit (HOSPITAL_COMMUNITY): Payer: Medicare Other | Admitting: Emergency Medicine

## 2017-11-08 ENCOUNTER — Other Ambulatory Visit: Payer: Self-pay

## 2017-11-08 ENCOUNTER — Encounter (HOSPITAL_COMMUNITY): Payer: Self-pay

## 2017-11-08 ENCOUNTER — Inpatient Hospital Stay (HOSPITAL_COMMUNITY): Payer: Medicare Other

## 2017-11-08 ENCOUNTER — Encounter (HOSPITAL_COMMUNITY)
Admission: AD | Disposition: A | Discharge status: Home Health | Payer: Self-pay | Source: Ambulatory Visit | Attending: Orthopaedic Surgery

## 2017-11-08 ENCOUNTER — Ambulatory Visit (HOSPITAL_COMMUNITY): Payer: Medicare Other | Admitting: Anesthesiology

## 2017-11-08 DIAGNOSIS — Z981 Arthrodesis status: Secondary | ICD-10-CM

## 2017-11-08 DIAGNOSIS — F419 Anxiety disorder, unspecified: Secondary | ICD-10-CM | POA: Diagnosis present

## 2017-11-08 DIAGNOSIS — Z79899 Other long term (current) drug therapy: Secondary | ICD-10-CM | POA: Diagnosis not present

## 2017-11-08 DIAGNOSIS — M1711 Unilateral primary osteoarthritis, right knee: Principal | ICD-10-CM | POA: Diagnosis present

## 2017-11-08 DIAGNOSIS — E7800 Pure hypercholesterolemia, unspecified: Secondary | ICD-10-CM | POA: Diagnosis present

## 2017-11-08 DIAGNOSIS — Z7982 Long term (current) use of aspirin: Secondary | ICD-10-CM

## 2017-11-08 DIAGNOSIS — K219 Gastro-esophageal reflux disease without esophagitis: Secondary | ICD-10-CM | POA: Diagnosis present

## 2017-11-08 DIAGNOSIS — I10 Essential (primary) hypertension: Secondary | ICD-10-CM | POA: Diagnosis present

## 2017-11-08 DIAGNOSIS — Z9841 Cataract extraction status, right eye: Secondary | ICD-10-CM

## 2017-11-08 DIAGNOSIS — Z96651 Presence of right artificial knee joint: Secondary | ICD-10-CM

## 2017-11-08 DIAGNOSIS — Z6832 Body mass index (BMI) 32.0-32.9, adult: Secondary | ICD-10-CM | POA: Diagnosis not present

## 2017-11-08 DIAGNOSIS — E669 Obesity, unspecified: Secondary | ICD-10-CM | POA: Diagnosis present

## 2017-11-08 DIAGNOSIS — M25561 Pain in right knee: Secondary | ICD-10-CM | POA: Diagnosis present

## 2017-11-08 DIAGNOSIS — Z96659 Presence of unspecified artificial knee joint: Secondary | ICD-10-CM

## 2017-11-08 HISTORY — PX: TOTAL KNEE ARTHROPLASTY: SHX125

## 2017-11-08 SURGERY — ARTHROPLASTY, KNEE, TOTAL
Anesthesia: Spinal | Site: Knee | Laterality: Right

## 2017-11-08 MED ORDER — ALUM & MAG HYDROXIDE-SIMETH 200-200-20 MG/5ML PO SUSP
30.0000 mL | ORAL | Status: DC | PRN
Start: 2017-11-08 — End: 2017-11-10

## 2017-11-08 MED ORDER — ACETAMINOPHEN 650 MG RE SUPP: 650.0000 mg | RECTAL | Status: DC | PRN

## 2017-11-08 MED ORDER — CEFAZOLIN SODIUM-DEXTROSE 2-4 GM/100ML-% IV SOLN
2.0000 g | Freq: Four times a day (QID) | INTRAVENOUS | Status: AC
  Administered 2017-11-08 – 2017-11-09 (×3): 2 g via INTRAVENOUS
  Filled 2017-11-08 (×3): qty 100

## 2017-11-08 MED ORDER — METHOCARBAMOL 1000 MG/10ML IJ SOLN: 500.0000 mg | Freq: Four times a day (QID) | INTRAVENOUS | Status: DC | PRN

## 2017-11-08 MED ORDER — PANTOPRAZOLE SODIUM 40 MG PO TBEC
40.0000 mg | DELAYED_RELEASE_TABLET | Freq: Every day | ORAL | Status: DC
  Administered 2017-11-09: 40 mg via ORAL
  Filled 2017-11-08 (×2): qty 1

## 2017-11-08 MED ORDER — GABAPENTIN 300 MG PO CAPS
300.0000 mg | ORAL_CAPSULE | Freq: Three times a day (TID) | ORAL | Status: DC
  Administered 2017-11-08 – 2017-11-10 (×6): 300 mg via ORAL
  Filled 2017-11-08 (×6): qty 1

## 2017-11-08 MED ORDER — TRANEXAMIC ACID 1000 MG/10ML IV SOLN
1000.0000 mg | INTRAVENOUS | Status: AC
  Administered 2017-11-08: 1000 mg via INTRAVENOUS
  Filled 2017-11-08: qty 1100

## 2017-11-08 MED ORDER — SODIUM CHLORIDE 0.9 % IR SOLN
Status: DC | PRN
  Administered 2017-11-08: 3000 mL

## 2017-11-08 MED ORDER — TRANEXAMIC ACID 1000 MG/10ML IV SOLN
2000.0000 mg | INTRAVENOUS | Status: AC
  Administered 2017-11-08: 2000 mg via TOPICAL
  Filled 2017-11-08: qty 20

## 2017-11-08 MED ORDER — VANCOMYCIN HCL 1000 MG IV SOLR
INTRAVENOUS | Status: AC
  Filled 2017-11-08: qty 1000

## 2017-11-08 MED ORDER — KETOROLAC TROMETHAMINE 15 MG/ML IJ SOLN
30.0000 mg | Freq: Four times a day (QID) | INTRAMUSCULAR | Status: AC
  Administered 2017-11-08 – 2017-11-09 (×4): 30 mg via INTRAVENOUS
  Filled 2017-11-08 (×4): qty 2

## 2017-11-08 MED ORDER — OXYCODONE HCL ER 15 MG PO T12A
15.0000 mg | EXTENDED_RELEASE_TABLET | Freq: Two times a day (BID) | ORAL | Status: DC
  Administered 2017-11-08 – 2017-11-10 (×4): 15 mg via ORAL
  Filled 2017-11-08 (×4): qty 1

## 2017-11-08 MED ORDER — VANCOMYCIN HCL 1000 MG IV SOLR
INTRAVENOUS | Status: DC | PRN
  Administered 2017-11-08 (×2): 1000 mg

## 2017-11-08 MED ORDER — DEXAMETHASONE SODIUM PHOSPHATE 10 MG/ML IJ SOLN
10.0000 mg | Freq: Once | INTRAMUSCULAR | Status: AC
  Administered 2017-11-09: 10 mg via INTRAVENOUS
  Filled 2017-11-08: qty 1

## 2017-11-08 MED ORDER — ONDANSETRON HCL 4 MG PO TABS
4.0000 mg | ORAL_TABLET | Freq: Four times a day (QID) | ORAL | Status: DC | PRN
Start: 2017-11-08 — End: 2017-11-10
  Administered 2017-11-10: 4 mg via ORAL
  Filled 2017-11-08: qty 1

## 2017-11-08 MED ORDER — METOCLOPRAMIDE HCL 5 MG PO TABS: 5.0000 mg | ORAL_TABLET | Freq: Three times a day (TID) | ORAL | Status: DC | PRN

## 2017-11-08 MED ORDER — NITROGLYCERIN 0.4 MG SL SUBL: 0.4000 mg | SUBLINGUAL_TABLET | SUBLINGUAL | Status: DC | PRN

## 2017-11-08 MED ORDER — SODIUM CHLORIDE 0.9 % IV SOLN
INTRAVENOUS | Status: DC
  Administered 2017-11-08: 18:00:00 via INTRAVENOUS

## 2017-11-08 MED ORDER — CARVEDILOL 25 MG PO TABS
25.0000 mg | ORAL_TABLET | Freq: Two times a day (BID) | ORAL | Status: DC
  Administered 2017-11-08 – 2017-11-10 (×4): 25 mg via ORAL
  Filled 2017-11-08 (×4): qty 1

## 2017-11-08 MED ORDER — TIZANIDINE HCL 4 MG PO TABS: 4.0000 mg | ORAL_TABLET | Freq: Four times a day (QID) | ORAL | 2 refills | Status: DC | PRN

## 2017-11-08 MED ORDER — MIDAZOLAM HCL 2 MG/2ML IJ SOLN
2.0000 mg | Freq: Once | INTRAMUSCULAR | Status: AC
  Administered 2017-11-08: 1.5 mg via INTRAVENOUS
  Filled 2017-11-08: qty 2

## 2017-11-08 MED ORDER — OXYCODONE HCL 5 MG PO TABS: 5.0000 mg | ORAL_TABLET | ORAL | Status: DC | PRN

## 2017-11-08 MED ORDER — PROPOFOL 10 MG/ML IV BOLUS
INTRAVENOUS | Status: DC | PRN
  Administered 2017-11-08: 40 mg via INTRAVENOUS

## 2017-11-08 MED ORDER — ONDANSETRON HCL 4 MG PO TABS: 4.0000 mg | ORAL_TABLET | Freq: Three times a day (TID) | ORAL | 0 refills | Status: DC | PRN

## 2017-11-08 MED ORDER — MORPHINE SULFATE (PF) 4 MG/ML IV SOLN: 1.0000 mg | INTRAVENOUS | Status: DC | PRN

## 2017-11-08 MED ORDER — METHOCARBAMOL 500 MG PO TABS: 500.0000 mg | ORAL_TABLET | Freq: Four times a day (QID) | ORAL | Status: DC | PRN

## 2017-11-08 MED ORDER — PROMETHAZINE HCL 25 MG PO TABS: 25.0000 mg | ORAL_TABLET | Freq: Four times a day (QID) | ORAL | 1 refills | Status: DC | PRN

## 2017-11-08 MED ORDER — AZATHIOPRINE 50 MG PO TABS
50.0000 mg | ORAL_TABLET | Freq: Every day | ORAL | Status: DC
  Administered 2017-11-08 – 2017-11-09 (×2): 50 mg via ORAL
  Filled 2017-11-08 (×2): qty 1

## 2017-11-08 MED ORDER — MAGNESIUM CITRATE PO SOLN: 1.0000 | Freq: Once | ORAL | Status: DC | PRN

## 2017-11-08 MED ORDER — SORBITOL 70 % SOLN: 30.0000 mL | Freq: Every day | Status: DC | PRN

## 2017-11-08 MED ORDER — SODIUM CHLORIDE 0.9% FLUSH
INTRAVENOUS | Status: DC | PRN
  Administered 2017-11-08: 40 mL

## 2017-11-08 MED ORDER — FENTANYL CITRATE (PF) 250 MCG/5ML IJ SOLN
INTRAMUSCULAR | Status: AC
  Filled 2017-11-08: qty 5

## 2017-11-08 MED ORDER — BUPIVACAINE LIPOSOME 1.3 % IJ SUSP
20.0000 mL | INTRAMUSCULAR | Status: DC
  Filled 2017-11-08: qty 20

## 2017-11-08 MED ORDER — MECLIZINE HCL 25 MG PO TABS
25.0000 mg | ORAL_TABLET | Freq: Three times a day (TID) | ORAL | Status: DC
  Administered 2017-11-08: 25 mg via ORAL
  Filled 2017-11-08 (×7): qty 1

## 2017-11-08 MED ORDER — OXYCODONE HCL ER 10 MG PO T12A: 10.0000 mg | EXTENDED_RELEASE_TABLET | Freq: Two times a day (BID) | ORAL | 0 refills | Status: DC

## 2017-11-08 MED ORDER — POLYETHYLENE GLYCOL 3350 17 G PO PACK: 17.0000 g | PACK | Freq: Every day | ORAL | Status: DC | PRN

## 2017-11-08 MED ORDER — PROPOFOL 500 MG/50ML IV EMUL
INTRAVENOUS | Status: DC | PRN
  Administered 2017-11-08: 75 ug/kg/min via INTRAVENOUS

## 2017-11-08 MED ORDER — OXYCODONE HCL 5 MG PO TABS
10.0000 mg | ORAL_TABLET | ORAL | Status: DC | PRN
  Administered 2017-11-09 – 2017-11-10 (×2): 10 mg via ORAL
  Filled 2017-11-08 (×2): qty 2

## 2017-11-08 MED ORDER — PRAVASTATIN SODIUM 20 MG PO TABS
20.0000 mg | ORAL_TABLET | Freq: Every evening | ORAL | Status: DC
  Administered 2017-11-08 – 2017-11-09 (×2): 20 mg via ORAL
  Filled 2017-11-08 (×2): qty 1

## 2017-11-08 MED ORDER — ASPIRIN EC 325 MG PO TBEC
325.0000 mg | DELAYED_RELEASE_TABLET | Freq: Two times a day (BID) | ORAL | Status: DC
  Administered 2017-11-08 – 2017-11-10 (×4): 325 mg via ORAL
  Filled 2017-11-08 (×4): qty 1

## 2017-11-08 MED ORDER — BUPIVACAINE LIPOSOME 1.3 % IJ SUSP
INTRAMUSCULAR | Status: DC | PRN
  Administered 2017-11-08: 20 mL

## 2017-11-08 MED ORDER — TRANEXAMIC ACID 1000 MG/10ML IV SOLN
1000.0000 mg | Freq: Once | INTRAVENOUS | Status: AC
  Administered 2017-11-08: 1000 mg via INTRAVENOUS
  Filled 2017-11-08: qty 10

## 2017-11-08 MED ORDER — ONDANSETRON HCL 4 MG/2ML IJ SOLN
4.0000 mg | Freq: Four times a day (QID) | INTRAMUSCULAR | Status: DC | PRN
  Administered 2017-11-08: 4 mg via INTRAVENOUS
  Filled 2017-11-08: qty 2

## 2017-11-08 MED ORDER — OXYCODONE HCL 5 MG PO TABS: 5.0000 mg | ORAL_TABLET | ORAL | 0 refills | Status: DC | PRN

## 2017-11-08 MED ORDER — ACETAMINOPHEN 500 MG PO TABS
1000.0000 mg | ORAL_TABLET | Freq: Four times a day (QID) | ORAL | Status: AC
  Administered 2017-11-08 – 2017-11-09 (×4): 1000 mg via ORAL
  Filled 2017-11-08 (×4): qty 2

## 2017-11-08 MED ORDER — 0.9 % SODIUM CHLORIDE (POUR BTL) OPTIME
TOPICAL | Status: DC | PRN
  Administered 2017-11-08: 1000 mL

## 2017-11-08 MED ORDER — PHENOL 1.4 % MT LIQD: 1.0000 | OROMUCOSAL | Status: DC | PRN

## 2017-11-08 MED ORDER — FENTANYL CITRATE (PF) 100 MCG/2ML IJ SOLN
100.0000 ug | Freq: Once | INTRAMUSCULAR | Status: AC
  Administered 2017-11-08: 75 ug via INTRAVENOUS
  Filled 2017-11-08: qty 2

## 2017-11-08 MED ORDER — ASPIRIN EC 325 MG PO TBEC: 325.0000 mg | DELAYED_RELEASE_TABLET | Freq: Two times a day (BID) | ORAL | 0 refills | Status: DC

## 2017-11-08 MED ORDER — SENNOSIDES-DOCUSATE SODIUM 8.6-50 MG PO TABS: 1.0000 | ORAL_TABLET | Freq: Every evening | ORAL | 1 refills | Status: DC | PRN

## 2017-11-08 MED ORDER — ROPIVACAINE HCL 7.5 MG/ML IJ SOLN
INTRAMUSCULAR | Status: DC | PRN
  Administered 2017-11-08: 20 mL via PERINEURAL

## 2017-11-08 MED ORDER — CHLORHEXIDINE GLUCONATE 4 % EX LIQD: 60.0000 mL | Freq: Once | CUTANEOUS | Status: DC

## 2017-11-08 MED ORDER — FENTANYL CITRATE (PF) 100 MCG/2ML IJ SOLN
INTRAMUSCULAR | Status: DC | PRN
  Administered 2017-11-08: 25 ug via INTRAVENOUS
  Administered 2017-11-08: 50 ug via INTRAVENOUS
  Administered 2017-11-08 (×3): 25 ug via INTRAVENOUS

## 2017-11-08 MED ORDER — ACETAMINOPHEN 325 MG PO TABS: 650.0000 mg | ORAL_TABLET | ORAL | Status: DC | PRN

## 2017-11-08 MED ORDER — DIPHENHYDRAMINE HCL 12.5 MG/5ML PO ELIX: 25.0000 mg | ORAL_SOLUTION | ORAL | Status: DC | PRN

## 2017-11-08 MED ORDER — MENTHOL 3 MG MT LOZG: 1.0000 | LOZENGE | OROMUCOSAL | Status: DC | PRN

## 2017-11-08 MED ORDER — METOCLOPRAMIDE HCL 5 MG/ML IJ SOLN: 5.0000 mg | Freq: Three times a day (TID) | INTRAMUSCULAR | Status: DC | PRN

## 2017-11-08 SURGICAL SUPPLY — 65 items
ALCOHOL ISOPROPYL (RUBBING) (MISCELLANEOUS) ×3 IMPLANT
BAG DECANTER FOR FLEXI CONT (MISCELLANEOUS) ×3 IMPLANT
BANDAGE ACE 6X5 VEL STRL LF (GAUZE/BANDAGES/DRESSINGS) ×3 IMPLANT
BANDAGE ESMARK 6X9 LF (GAUZE/BANDAGES/DRESSINGS) ×1 IMPLANT
BENZOIN TINCTURE PRP APPL 2/3 (GAUZE/BANDAGES/DRESSINGS) ×3 IMPLANT
BLADE SAW SGTL 13.0X1.19X90.0M (BLADE) ×3 IMPLANT
BNDG ESMARK 6X9 LF (GAUZE/BANDAGES/DRESSINGS) ×3
BOWL SMART MIX CTS (DISPOSABLE) ×3 IMPLANT
CAPT KNEE TOTAL 3 ×3 IMPLANT
CEMENT BONE REFOBACIN R1X40 US (Cement) ×6 IMPLANT
CLOSURE STERI-STRIP 1/2X4 (GAUZE/BANDAGES/DRESSINGS) ×2
CLSR STERI-STRIP ANTIMIC 1/2X4 (GAUZE/BANDAGES/DRESSINGS) ×4 IMPLANT
COVER SURGICAL LIGHT HANDLE (MISCELLANEOUS) ×3 IMPLANT
CUFF TOURNIQUET SINGLE 34IN LL (TOURNIQUET CUFF) ×3 IMPLANT
CUFF TOURNIQUET SINGLE 44IN (TOURNIQUET CUFF) IMPLANT
DRAPE EXTREMITY T 121X128X90 (DRAPE) ×3 IMPLANT
DRAPE HALF SHEET 40X57 (DRAPES) ×3 IMPLANT
DRAPE INCISE IOBAN 66X45 STRL (DRAPES) IMPLANT
DRAPE ORTHO SPLIT 77X108 STRL (DRAPES) ×4
DRAPE SURG 17X11 SM STRL (DRAPES) ×6 IMPLANT
DRAPE SURG ORHT 6 SPLT 77X108 (DRAPES) ×2 IMPLANT
DRSG AQUACEL AG ADV 3.5X14 (GAUZE/BANDAGES/DRESSINGS) ×3 IMPLANT
DURAPREP 26ML APPLICATOR (WOUND CARE) ×6 IMPLANT
ELECT CAUTERY BLADE 6.4 (BLADE) ×3 IMPLANT
ELECT REM PT RETURN 9FT ADLT (ELECTROSURGICAL) ×3
ELECTRODE REM PT RTRN 9FT ADLT (ELECTROSURGICAL) ×1 IMPLANT
GLOVE BIOGEL PI IND STRL 7.0 (GLOVE) ×1 IMPLANT
GLOVE BIOGEL PI INDICATOR 7.0 (GLOVE) ×2
GLOVE ECLIPSE 7.0 STRL STRAW (GLOVE) ×3 IMPLANT
GLOVE SKINSENSE NS SZ7.5 (GLOVE) ×2
GLOVE SKINSENSE STRL SZ7.5 (GLOVE) ×1 IMPLANT
GLOVE SURG SYN 7.5  E (GLOVE) ×8
GLOVE SURG SYN 7.5 E (GLOVE) ×4 IMPLANT
GOWN STRL REIN XL XLG (GOWN DISPOSABLE) ×3 IMPLANT
GOWN STRL REUS W/ TWL LRG LVL3 (GOWN DISPOSABLE) ×1 IMPLANT
GOWN STRL REUS W/TWL LRG LVL3 (GOWN DISPOSABLE) ×2
HANDPIECE INTERPULSE COAX TIP (DISPOSABLE) ×2
HOOD PEEL AWAY FLYTE STAYCOOL (MISCELLANEOUS) ×6 IMPLANT
KIT BASIN OR (CUSTOM PROCEDURE TRAY) ×3 IMPLANT
KIT ROOM TURNOVER OR (KITS) ×3 IMPLANT
MANIFOLD NEPTUNE II (INSTRUMENTS) ×3 IMPLANT
MARKER SKIN DUAL TIP RULER LAB (MISCELLANEOUS) ×3 IMPLANT
NEEDLE SPNL 18GX3.5 QUINCKE PK (NEEDLE) ×3 IMPLANT
NS IRRIG 1000ML POUR BTL (IV SOLUTION) ×3 IMPLANT
PACK TOTAL JOINT (CUSTOM PROCEDURE TRAY) ×3 IMPLANT
PAD ARMBOARD 7.5X6 YLW CONV (MISCELLANEOUS) ×6 IMPLANT
SAW OSC TIP CART 19.5X105X1.3 (SAW) ×3 IMPLANT
SET HNDPC FAN SPRY TIP SCT (DISPOSABLE) ×1 IMPLANT
STAPLER VISISTAT 35W (STAPLE) IMPLANT
SUCTION FRAZIER HANDLE 10FR (MISCELLANEOUS) ×2
SUCTION TUBE FRAZIER 10FR DISP (MISCELLANEOUS) ×1 IMPLANT
SUT ETHILON 2 0 FS 18 (SUTURE) IMPLANT
SUT MNCRL AB 4-0 PS2 18 (SUTURE) IMPLANT
SUT VIC AB 0 CT1 27 (SUTURE) ×4
SUT VIC AB 0 CT1 27XBRD ANBCTR (SUTURE) ×2 IMPLANT
SUT VIC AB 1 CTX 27 (SUTURE) ×12 IMPLANT
SUT VIC AB 2-0 CT1 27 (SUTURE) ×8
SUT VIC AB 2-0 CT1 TAPERPNT 27 (SUTURE) ×4 IMPLANT
SYR 50ML LL SCALE MARK (SYRINGE) ×3 IMPLANT
TOWEL OR 17X24 6PK STRL BLUE (TOWEL DISPOSABLE) ×3 IMPLANT
TOWEL OR 17X26 10 PK STRL BLUE (TOWEL DISPOSABLE) ×3 IMPLANT
TRAY CATH 16FR W/PLASTIC CATH (SET/KITS/TRAYS/PACK) IMPLANT
TRAY URETHRAL FOLEY CATH 14FR (CATHETERS) ×3 IMPLANT
UNDERPAD 30X30 (UNDERPADS AND DIAPERS) ×3 IMPLANT
WRAP KNEE MAXI GEL POST OP (GAUZE/BANDAGES/DRESSINGS) ×3 IMPLANT

## 2017-11-08 NOTE — Op Note (Signed)
Total Knee Arthroplasty Procedure Note  Preoperative diagnosis: Right knee osteoarthritis  Postoperative diagnosis:same  Operative procedure: Right total knee arthroplasty. CPT [phone]  Surgeon: N. Eduard Roux, MD  Assist: Madalyn Rob, PA-C; necessary for the timely completion of procedure and due to complexity of procedure.  Anesthesia: Spinal, regional  Tourniquet time: see anesthesia record  Implants used: [employer] Femur: PS 4 Tibia: 4 Patella: 32 mm, 7.5 thick Polyethylene: 11 mm  Indication: [name] is a 62 y.o. year old female with a history of knee pain. Having failed conservative management, the patient elected to proceed with a total knee arthroplasty.  We have reviewed the risk and benefits of the surgery and they elected to proceed after voicing understanding.  Procedure:  After informed consent was obtained and understanding of the risk were voiced including but not limited to bleeding, infection, damage to surrounding structures including nerves and vessels, blood clots, leg length inequality and the failure to achieve desired results, the operative extremity was marked with verbal confirmation of the patient in the holding area.   The patient was then brought to the operating room and transported to the operating room table in the supine position.  A tourniquet was applied to the operative extremity around the upper thigh. The operative limb was then prepped and draped in the usual sterile fashion and preoperative antibiotics were administered.  A time out was performed prior to the start of surgery confirming the correct extremity, preoperative antibiotic administration, as well as team members, implants and instruments available for the case. Correct surgical site was also confirmed with preoperative radiographs. The limb was then elevated for exsanguination and the tourniquet was inflated. A midline incision was made and a standard medial  parapatellar approach was performed.  The patella was prepared and sized to a 32 mm.  A cover was placed on the patella for protection from retractors.  We then turned our attention to the femur. Posterior cruciate ligament was sacrificed. Start site was drilled in the femur and the intramedullary distal femoral cutting guide was placed, set at 5 degrees valgus, taking 9 mm of distal resection. The distal cut was made. Osteophytes were then removed. Next, the proximal tibial cutting guide was placed with appropriate slope, varus/valgus alignment and depth of resection. The proximal tibial cut was made. Gap blocks were then used to assess the extension gap and alignment, and appropriate soft tissue releases were performed. Attention was turned back to the femur, which was sized using the sizing guide to a size 4. Appropriate rotation of the femoral component was determined using epicondylar axis, Whiteside's line, and assessing the flexion gap under ligament tension. The appropriate size 4-in-1 cutting block was placed and cuts were made. Posterior femoral osteophytes and uncapped bone were then removed with the curved osteotome. The tibia was sized for a size 4 component. The femoral box-cutting guide was placed and prepared for a PS femoral component. Trial components were placed, and stability was checked in full extension, mid-flexion, and deep flexion. Proper tibial rotation was determined and marked.  The patella tracked well without a lateral release. Trial components were then removed and tibial preparation performed. A posterior capsular injection comprising of 20 cc of 1.3% exparel and 40 cc of normal saline was performed for postoperative pain control. The bony surfaces were irrigated with a pulse lavage and then dried. Bone cement was vacuum mixed on the back table, and the final components sized above were cemented into place.  After cement had finished curing, excess cement was removed. The stability  of the construct was re-evaluated throughout a range of motion and found to be acceptable. The trial liner was removed, the knee was copiously irrigated, and the knee was re-evaluated for any excess bone debris. The real polyethylene liner, 11 mm thick, was inserted and checked to ensure the locking mechanism had engaged appropriately. The tourniquet was deflated and hemostasis was achieved. The wound was irrigated with normal saline.  One gram of vancomycin powder was placed in the surgical bed. A drain was not placed. Capsular closure was performed with a #1 vicryl, subcutaneous fat closed with a 0 vicryl suture, then subcutaneous tissue closed with interrupted 2.0 vicryl suture. The skin was then closed with a 3.0 monocryl. A sterile dressing was applied.  The patient was awakened in the operating room and taken to recovery in stable condition. All sponge, needle, and instrument counts were correct at the end of the case.  Position: supine  Complications: none.  Time Out: performed   Drains/Packing: none  Estimated blood loss: minimal  Returned to Recovery Room: in good condition.   Antibiotics: yes   Mechanical VTE (DVT) Prophylaxis: sequential compression devices, TED thigh-high  Chemical VTE (DVT) Prophylaxis: aspirin  Fluid Replacement  Crystalloid: see anesthesia record Blood: none  FFP: none   Specimens Removed: 1 to pathology   Sponge and Instrument Count Correct? yes   PACU: portable radiograph - knee AP and Lateral   Admission: inpatient status  Plan/RTC: Return in 2 weeks for wound check.   Weight Bearing/Load Lower Extremity: full   N. Eduard Roux, MD [place] 11:57 AM

## 2017-11-08 NOTE — Anesthesia Postprocedure Evaluation (Signed)
Anesthesia Post Note  Patient: ETERNITY DEXTER  Procedure(s) Performed: RIGHT TOTAL KNEE ARTHROPLASTY (Right Knee)     Patient location during evaluation: PACU Anesthesia Type: Spinal Level of consciousness: oriented and awake and alert Pain management: pain level controlled Vital Signs Assessment: post-procedure vital signs reviewed and stable Respiratory status: spontaneous breathing, respiratory function stable and patient connected to nasal cannula oxygen Cardiovascular status: blood pressure returned to baseline and stable Postop Assessment: no headache, no backache and no apparent nausea or vomiting Anesthetic complications: no    Last Vitals:  Vitals:   11/08/17 1530 11/08/17 1545  BP: (!) 171/76 (!) 146/87  Pulse: 72 71  Resp: (!) 22 20  Temp:    SpO2: 100% 100%    Last Pain:  Vitals:   11/08/17 1548  TempSrc:   PainSc: 0-No pain    LLE Motor Response: No movement due to regional block (11/08/17 1548) LLE Sensation: Numbness (11/08/17 1548) RLE Motor Response: No movement due to regional block (11/08/17 1548) RLE Sensation: Numbness (11/08/17 1548) L Sensory Level: L4-Anterior knee, lower leg (11/08/17 1548) R Sensory Level: L4-Anterior knee, lower leg (11/08/17 1548)  Lura Falor,JAMES TERRILL

## 2017-11-08 NOTE — Progress Notes (Addendum)
Physical Therapy Evaluation Patient Details Name: [name] MRN: [id number] DOB: 1956-06-03 Today's Date: 11/08/2017   History of Present Illness  62 y.o. female s/p R TKA 2/14. PMH includes: Anxiety, HTN, PONV, Spinal Stenosis, C spine surgery.   Clinical Impression  Patient is s/p above surgery resulting in functional limitations due to the deficits listed below (see PT Problem List). PTA, pt independent with all mobility, sometimes using cane while walking. Pt lives with older sister in 1 story home with stairs to enter. Upon eval, pt presenting with little post op pain, weakness, and nausea that limit her mobility. Currently min A level for transfers and min guard for short distance ambulation. Session limited by nausea, will progress next visit. Patient will benefit from skilled PT to increase their independence and safety with mobility to allow discharge to the venue listed below.       Follow Up Recommendations Home health PT;Supervision for mobility/OOB;Follow surgeon's recommendation for DC plan and follow-up therapies    Equipment Recommendations  (TBD)    Recommendations for Other Services       Precautions / Restrictions Precautions Precautions: Fall Precaution Comments: reviewed supine therex and no pillow under knee Restrictions Weight Bearing Restrictions: No Other Position/Activity Restrictions: WBAT      Mobility  Bed Mobility Overal bed mobility: Needs Assistance Bed Mobility: Sit to Supine       Sit to supine: Min assist   General bed mobility comments: Min A to assist with surgical limb over EOB.   Transfers Overall transfer level: Needs assistance Equipment used: Rolling walker (2 wheeled) Transfers: Sit to/from Stand Sit to Stand: Min assist         General transfer comment: Min A to power up, cues for hand placement.   Ambulation/Gait Ambulation/Gait assistance: Min guard Ambulation Distance (Feet): 15 Feet Assistive device: Rolling  walker (2 wheeled) Gait Pattern/deviations: Step-to pattern Gait velocity: decreased   General Gait Details: cues for sequencing and safety with RW, wb status and progressing step length. pt ambulated in hosptial room and back to bed, citing nausea and fatigue. felt better once resting supine for 2 minutes.   Stairs            Wheelchair Mobility    Modified Rankin (Stroke Patients Only)       Balance Overall balance assessment: Needs assistance   Sitting balance-Leahy Scale: Fair       Standing balance-Leahy Scale: Poor                               Pertinent Vitals/Pain Pain Assessment: No/denies pain    Home Living Family/patient expects to be discharged to:: Private residence Living Arrangements: Other relatives Available Help at Discharge: Available PRN/intermittently;Friend(s) Type of Home: House Home Access: Ramped entrance     Home Layout: One level Home Equipment: [place] - 2 wheels;Wheelchair - manual      Prior Function Level of Independence: Independent with assistive device(s)(ambulating with assistive device at times. driving)         Comments: sister has been grocery shopping and assisting with some ADLs     Hand Dominance        Extremity/Trunk Assessment   Upper Extremity Assessment Upper Extremity Assessment: Defer to OT evaluation    Lower Extremity Assessment Lower Extremity Assessment: (LLE strength 4/5, RLE 3/5 post op pain )       Communication   Communication: No difficulties  Cognition  Arousal/Alertness: Awake/alert Behavior During Therapy: WFL for tasks assessed/performed                                          General Comments General comments (skin integrity, edema, etc.): BP 167/86, SpO2 98 after activity. RN notified of BP    Exercises Total Joint Exercises Ankle Circles/Pumps: 20 reps Quad Sets: 10 reps   Assessment/Plan    PT Assessment Patient needs continued PT  services  PT Problem List Decreased strength;Decreased range of motion;Decreased activity tolerance;Decreased balance;Decreased mobility;Pain       PT Treatment Interventions      PT Goals (Current goals can be found in the Care Plan section)  Acute Rehab PT Goals Patient Stated Goal: feel better PT Goal Formulation: With patient Time For Goal Achievement: 11/15/17 Potential to Achieve Goals: Good    Frequency 7X/week   Barriers to discharge        Co-evaluation               AM-PAC PT "6 Clicks" Daily Activity  Outcome Measure Difficulty turning over in bed (including adjusting bedclothes, sheets and blankets)?: Unable Difficulty moving from lying on back to sitting on the side of the bed? : Unable Difficulty sitting down on and standing up from a chair with arms (e.g., wheelchair, bedside commode, etc,.)?: Unable Help needed moving to and from a bed to chair (including a wheelchair)?: A Little Help needed walking in hospital room?: A Little Help needed climbing 3-5 steps with a railing? : A Lot 6 Click Score: 11    End of Session Equipment Utilized During Treatment: Gait belt Activity Tolerance: Patient limited by fatigue Patient left: in bed Nurse Communication: Mobility status PT Visit Diagnosis: Unsteadiness on feet (R26.81);Other abnormalities of gait and mobility (R26.89);Muscle weakness (generalized) (M62.81);Pain Pain - Right/Left: Right Pain - part of body: Knee    Time: 9191-6606 PT Time Calculation (min) (ACUTE ONLY): 33 min   Charges:   PT Evaluation $PT Eval Low Complexity: 1 Low PT Treatments $Therapeutic Activity: 8-22 mins   PT G Codes:        Reinaldo Berber, PT, DPT Acute Rehab Services Pager: [phone]    Reinaldo Berber 11/08/2017, 9:41 PM

## 2017-11-08 NOTE — Discharge Instructions (Signed)

## 2017-11-08 NOTE — H&P (Signed)
PREOPERATIVE H&P  Chief Complaint: right knee osteoarthritis  HPI: [name] is a 61 y.o. female who presents for surgical treatment of right knee osteoarthritis.  She denies any changes in medical history.  Past Medical History:  Diagnosis Date  . Anxiety   . Arthritis   . Deviated septum   . Dyspnea    with exertion   wheezing at night  . Fibroid tumor   . GERD (gastroesophageal reflux disease)   . Hepatitis    AS CHILD    . Hypercholesteremia   . Hypertension   . Memory loss   . Meniscus tear   . MRSA infection   . PONV (postoperative nausea and vomiting)   . Retinal detachment   . Retinal detachment   . Spinal stenosis   . Urinary frequency    Past Surgical History:  Procedure Laterality Date  . ANTERIOR CERVICAL DECOMP/DISCECTOMY FUSION N/A 11/02/2015   Procedure: ANTERIOR CERVICAL DECOMPRESSION FUSION CERVICAL FIVE-SIX,CERVICAL SIX-SEVEN;  Surgeon: Karie Chimera, MD;  Location: [facility];  Service: Neurosurgery;  Laterality: N/A;  right side approach  . CATARACT EXTRACTION Right   . fibroid tumor    . KNEE SURGERY Bilateral   . RETINAL DETACHMENT SURGERY Right    Social History   Socioeconomic History  . Marital status: Single    Spouse name: Not on file  . Number of children: 0  . Years of education: Masters  . Highest education level: Not on file  Social Needs  . Financial resource strain: Not on file  . Food insecurity - worry: Not on file  . Food insecurity - inability: Not on file  . Transportation needs - medical: Not on file  . Transportation needs - non-medical: Not on file  Occupational History  . Occupation: caregiver    Comment: Part-time  Tobacco Use  . Smoking status: Never Smoker  . Smokeless tobacco: Never Used  Substance and Sexual Activity  . Alcohol use: No  . Drug use: No  . Sexual activity: Not on file  Other Topics Concern  . Not on file  Social History Narrative   Lives at home with her sister.   Right-handed.     1 cup coffee daily.   Family History  Problem Relation Age of Onset  . Stroke Mother   . Arrhythmia Mother   . Bladder Cancer Mother   . Thyroid disease Mother   . Heart disease Father   . Heart attack Father   . Stroke Maternal Grandfather   . Stroke Maternal Grandmother    No Known Allergies Prior to Admission medications   Medication Sig Start Date End Date Taking? Authorizing Provider  aspirin EC 81 MG tablet Take 81 mg by mouth daily.     [provider]  azaTHIOprine (IMURAN) 50 MG tablet Take 1 tablet (50 mg total) by mouth daily. Patient taking differently: Take 50 mg by mouth at bedtime.  05/16/16   Marcial Pacas, MD  calcium carbonate (OSCAL) 1500 (600 Ca) MG TABS tablet Take 1,500 mg by mouth daily.    [provider]  carvedilol (COREG) 25 MG tablet TAKE 1 TABLET (25 MG TOTAL) BY MOUTH 2 (TWO) TIMES DAILY WITH A MEAL. 01/01/17   Marcial Pacas, MD  Cholecalciferol (VITAMIN D) 2000 units CAPS Take 2,000 Units by mouth daily.    [provider]  docusate sodium (COLACE) 100 MG capsule Take 100 mg by mouth daily.     [provider]  folic acid (FOLVITE) 1 MG tablet Take 1 mg by mouth daily.  10/12/15   [provider]  gabapentin (NEURONTIN) 100 MG capsule Take 3 capsules (300 mg total) by mouth 3 (three) times daily. 10/03/17   Marcial Pacas, MD  meclizine (ANTIVERT) 25 MG tablet Take 1 tablet (25 mg total) by mouth 3 (three) times daily. 10/03/17   Marcial Pacas, MD  nitroGLYCERIN (NITROSTAT) 0.4 MG SL tablet Place 0.4 mg under the tongue every 5 (five) minutes as needed for chest pain.  01/21/15   [provider]  omeprazole (PRILOSEC) 20 MG capsule Take 20 mg by mouth daily.  02/17/16   [provider]  ondansetron (ZOFRAN) 4 MG tablet Take 1 tablet (4 mg total) by mouth every 6 (six) hours as needed for nausea. 09/27/17   Georgette Shell, MD  OVER THE COUNTER MEDICATION Take 1 tablet by mouth daily. [employer]    [provider]  pravastatin (PRAVACHOL) 20 MG tablet Take 20 mg by mouth every evening. 06/04/17   [provider]     Positive ROS: All other systems have been reviewed and were otherwise negative with the exception of those mentioned in the HPI and as above.  Physical Exam: General: Alert, no acute distress Cardiovascular: No pedal edema Respiratory: No cyanosis, no use of accessory musculature GI: abdomen soft Skin: No lesions in the area of chief complaint Neurologic: Sensation intact distally Psychiatric: Patient is competent for consent with normal mood and affect Lymphatic: no lymphedema  MUSCULOSKELETAL: exam stable  Assessment: right knee osteoarthritis  Plan: Plan for Procedure(s): RIGHT TOTAL KNEE ARTHROPLASTY  The risks benefits and alternatives were discussed with the patient including but not limited to the risks of nonoperative treatment, versus surgical intervention including infection, bleeding, nerve injury,  blood clots, cardiopulmonary complications, morbidity, mortality, among others, and they were willing to proceed.   Eduard Roux, MD   11/08/2017 8:06 AM

## 2017-11-08 NOTE — Anesthesia Preprocedure Evaluation (Addendum)
Anesthesia Evaluation  Patient identified by MRN, date of birth, ID band Patient awake    Reviewed: Allergy & Precautions, NPO status , Patient's Chart, lab work & pertinent test results  History of Anesthesia Complications (+) PONV and history of anesthetic complications  Airway Mallampati: II  TM Distance: <3 FB Neck ROM: Full    Dental  (+) Teeth Intact   Pulmonary shortness of breath,    breath sounds clear to auscultation       Cardiovascular hypertension, + Peripheral Vascular Disease   Rhythm:Regular Rate:Normal     Neuro/Psych  Neuromuscular disease    GI/Hepatic GERD  ,(+) Hepatitis -  Endo/Other  Morbid obesity  Renal/GU negative Renal ROS     Musculoskeletal  (+) Arthritis , Back pain and known kyphoscoliosos   Abdominal (+) + obese,   Peds  Hematology negative hematology ROS (+)   Anesthesia Other Findings   Reproductive/Obstetrics                             Anesthesia Physical Anesthesia Plan  ASA: III  Anesthesia Plan: Spinal   Post-op Pain Management:  Regional for Post-op pain   Induction: Intravenous  PONV Risk Score and Plan:   Airway Management Planned: Natural Airway and Simple Face Mask  Additional Equipment:   Intra-op Plan:   Post-operative Plan:   Informed Consent: I have reviewed the patients History and Physical, chart, labs and discussed the procedure including the risks, benefits and alternatives for the proposed anesthesia with the patient or authorized representative who has indicated his/her understanding and acceptance.     Plan Discussed with: CRNA  Anesthesia Plan Comments:        Anesthesia Quick Evaluation

## 2017-11-08 NOTE — Anesthesia Procedure Notes (Signed)
Anesthesia Regional Block: Adductor canal block   Pre-Anesthetic Checklist: ,, timeout performed, Correct Patient, Correct Site, Correct Procedure, Correct Position, site marked, risks and benefits discussed, surgical consent, pre-op evaluation,  At surgeon's request and post-op pain management  Laterality: Right and Lower  Prep: chloraprep       Needles:   Needle Type: Echogenic Stimulator Needle     Needle Length: 9cm  Needle Gauge: 21   Needle insertion depth: 7 cm   Additional Needles:   Procedures:,,,, ultrasound used (permanent image in chart),,,,  Narrative:  Start time: 11/08/2017 11:46 AM End time: 11/08/2017 11:59 AM Injection made incrementally with aspirations every 5 mL.  Performed by: Personally  Anesthesiologist: Rica Koyanagi, MD

## 2017-11-08 NOTE — Progress Notes (Signed)
Orthopedic Tech Progress Note Patient Details:  [name] 18-Nov-1955 [id number]  CPM Right Knee CPM Right Knee: On Right Knee Flexion (Degrees): 90 Right Knee Extension (Degrees): 0 Additional Comments: applied at 1548  Post Interventions Patient Tolerated: Well Instructions Provided: Care of device  [name] 11/08/2017, 3:56 PM

## 2017-11-08 NOTE — Transfer of Care (Signed)
Immediate Anesthesia Transfer of Care Note  Patient: [name]  Procedure(s) Performed: RIGHT TOTAL KNEE ARTHROPLASTY (Right Knee)  Patient Location: PACU  Anesthesia Type:MAC combined with regional for post-op pain  Level of Consciousness: awake and patient cooperative  Airway & Oxygen Therapy: Patient Spontanous Breathing  Post-op Assessment: Report given to RN and Post -op Vital signs reviewed and stable  Post vital signs: Reviewed and stable  Last Vitals:  Vitals:   11/08/17 1054 11/08/17 1500  BP: (!) 180/90 (!) (P) 123/99  Pulse:    Resp:    Temp:  (P) 36.5 C  SpO2:      Last Pain:  Vitals:   11/08/17 1043  TempSrc: Oral  PainSc: 3       Patients Stated Pain Goal: 1 (73/53/29 9242)  Complications: No apparent anesthesia complications

## 2017-11-09 ENCOUNTER — Encounter (HOSPITAL_COMMUNITY): Payer: Self-pay | Admitting: Orthopaedic Surgery

## 2017-11-09 LAB — BASIC METABOLIC PANEL
ANION GAP: 11 (ref 5–15)
BUN: 24 mg/dL — ABNORMAL HIGH (ref 6–20)
CO2: 24 mmol/L (ref 22–32)
Calcium: 8.4 mg/dL — ABNORMAL LOW (ref 8.9–10.3)
Chloride: 102 mmol/L (ref 101–111)
Creatinine, Ser: 0.9 mg/dL (ref 0.44–1.00)
GFR calc Af Amer: >60 mL/min (ref >60)
Glucose, Bld: 155 mg/dL — ABNORMAL HIGH (ref 65–99)
POTASSIUM: 4.3 mmol/L (ref 3.5–5.1)
SODIUM: 137 mmol/L (ref 135–145)

## 2017-11-09 LAB — CBC
HCT: 35.1 % — ABNORMAL LOW (ref 36.0–46.0)
Hemoglobin: 11.2 g/dL — ABNORMAL LOW (ref 12.0–15.0)
MCH: 32.1 pg (ref 26.0–34.0)
MCHC: 31.9 g/dL (ref 30.0–36.0)
MCV: 100.6 fL — ABNORMAL HIGH (ref 78.0–100.0)
Platelets: 210 K/uL (ref 150–400)
RBC: 3.49 MIL/uL — ABNORMAL LOW (ref 3.87–5.11)
RDW: 14.5 % (ref 11.5–15.5)
WBC: 11.5 K/uL — ABNORMAL HIGH (ref 4.0–10.5)

## 2017-11-09 MED ORDER — PANTOPRAZOLE SODIUM 40 MG PO TBEC
40.0000 mg | DELAYED_RELEASE_TABLET | Freq: Two times a day (BID) | ORAL | Status: DC
  Administered 2017-11-09 – 2017-11-10 (×2): 40 mg via ORAL
  Filled 2017-11-09 (×2): qty 1

## 2017-11-09 NOTE — Discharge Summary (Signed)
Physician Discharge Summary      Patient ID: [name] MRN: [id number] DOB/AGE: July 29, 1956 62 y.o.  Admit date: 11/08/2017 Discharge date: 11/10/17   Admission Diagnoses:  <principal problem not specified>  Discharge Diagnoses:  Active Problems:   Total knee replacement status   Past Medical History:  Diagnosis Date  . Anxiety   . Arthritis   . Deviated septum   . Dyspnea    with exertion   wheezing at night  . Fibroid tumor   . GERD (gastroesophageal reflux disease)   . Hepatitis    AS CHILD    . Hypercholesteremia   . Hypertension   . Memory loss   . Meniscus tear   . MRSA infection   . PONV (postoperative nausea and vomiting)   . Retinal detachment   . Retinal detachment   . Spinal stenosis   . Urinary frequency     Surgeries: Procedure(s): RIGHT TOTAL KNEE ARTHROPLASTY on 11/08/2017   Consultants (if any):   Discharged Condition: Improved  Hospital Course: [name] is an 62 y.o. female who was admitted 11/08/2017 with a diagnosis of <principal problem not specified> and went to the operating room on 11/08/2017 and underwent the above named procedures.    She was given perioperative antibiotics:  Anti-infectives (From admission, onward)   Start     Dose/Rate Route Frequency Ordered Stop   11/08/17 1830  ceFAZolin (ANCEF) IVPB 2g/100 mL premix     2 g 200 mL/hr over 30 Minutes Intravenous Every 6 hours 11/08/17 1710 11/09/17 1229   11/08/17 1413  vancomycin (VANCOCIN) powder  Status:  Discontinued       As needed 11/08/17 1413 11/08/17 1455   11/08/17 1100  ceFAZolin (ANCEF) IVPB 2g/100 mL premix     2 g 200 mL/hr over 30 Minutes Intravenous To [facility] 11/07/17 1024 11/08/17 1235    .  She was given sequential compression devices, early ambulation, and aspirin for DVT prophylaxis.  She benefited maximally from the hospital stay and there were no complications.    Recent vital signs:  Vitals:   11/09/17 0426 11/09/17 0700  BP:  (!) 116/54 129/69  Pulse: 71 71  Resp:    Temp: 98.1 F (36.7 C)   SpO2: 98%     Recent laboratory studies:  Lab Results  Component Value Date   HGB 11.2 (L) 11/09/2017   HGB 13.4 10/29/2017   HGB 12.4 09/26/2017   Lab Results  Component Value Date   WBC 11.5 (H) 11/09/2017   PLT 210 11/09/2017   Lab Results  Component Value Date   INR 1.01 10/29/2017   Lab Results  Component Value Date   NA 137 11/09/2017   K 4.3 11/09/2017   CL 102 11/09/2017   CO2 24 11/09/2017   BUN 24 (H) 11/09/2017   CREATININE 0.90 11/09/2017   GLUCOSE 155 (H) 11/09/2017    Discharge Medications:   Allergies as of 11/09/2017   No Known Allergies     Medication List    TAKE these medications   aspirin EC 325 MG tablet Take 1 tablet (325 mg total) by mouth 2 (two) times daily. What changed:    medication strength  how much to take  when to take this   azaTHIOprine 50 MG tablet Commonly known as:  IMURAN Take 1 tablet (50 mg total) by mouth daily. What changed:  when to take this   calcium carbonate 1500 (600 Ca) MG Tabs tablet Commonly known  as:  OSCAL Take 1,500 mg by mouth daily.   carvedilol 25 MG tablet Commonly known as:  COREG TAKE 1 TABLET (25 MG TOTAL) BY MOUTH 2 (TWO) TIMES DAILY WITH A MEAL.   docusate sodium 100 MG capsule Commonly known as:  COLACE Take 100 mg by mouth daily.   folic acid 1 MG tablet Commonly known as:  FOLVITE Take 1 mg by mouth daily.   gabapentin 100 MG capsule Commonly known as:  NEURONTIN Take 3 capsules (300 mg total) by mouth 3 (three) times daily.   meclizine 25 MG tablet Commonly known as:  ANTIVERT Take 1 tablet (25 mg total) by mouth 3 (three) times daily.   NITROSTAT 0.4 MG SL tablet Generic drug:  nitroGLYCERIN Place 0.4 mg under the tongue every 5 (five) minutes as needed for chest pain.   omeprazole 20 MG capsule Commonly known as:  PRILOSEC Take 20 mg by mouth daily.   ondansetron 4 MG tablet Commonly known as:   ZOFRAN Take 1 tablet (4 mg total) by mouth every 6 (six) hours as needed for nausea. What changed:  Another medication with the same name was added. Make sure you understand how and when to take each.   ondansetron 4 MG tablet Commonly known as:  ZOFRAN Take 1-2 tablets (4-8 mg total) by mouth every 8 (eight) hours as needed for nausea or vomiting. What changed:  You were already taking a medication with the same name, and this prescription was added. Make sure you understand how and when to take each.   OVER THE COUNTER MEDICATION Take 1 tablet by mouth daily. Vision Gold   oxyCODONE 10 mg 12 hr tablet Commonly known as:  OXYCONTIN Take 1 tablet (10 mg total) by mouth every 12 (twelve) hours.   oxyCODONE 5 MG immediate release tablet Commonly known as:  Oxy IR/ROXICODONE Take 1-3 tablets (5-15 mg total) by mouth every 4 (four) hours as needed.   pravastatin 20 MG tablet Commonly known as:  PRAVACHOL Take 20 mg by mouth every evening.   promethazine 25 MG tablet Commonly known as:  PHENERGAN Take 1 tablet (25 mg total) by mouth every 6 (six) hours as needed for nausea.   senna-docusate 8.6-50 MG tablet Commonly known as:  SENOKOT S Take 1 tablet by mouth at bedtime as needed.   tiZANidine 4 MG tablet Commonly known as:  ZANAFLEX Take 1 tablet (4 mg total) by mouth every 6 (six) hours as needed for muscle spasms.   Vitamin D 2000 units Caps Take 2,000 Units by mouth daily.            Durable Medical Equipment  (From admission, onward)        Start     Ordered   11/08/17 1711  DME Walker rolling  Once    Question:  Patient needs a walker to treat with the following condition  Answer:  Total knee replacement status   11/08/17 1710   11/08/17 1711  DME 3 n 1  Once     11/08/17 1710   11/08/17 1711  DME Bedside commode  Once    Question:  Patient needs a bedside commode to treat with the following condition  Answer:  Total knee replacement status   11/08/17 1710       Diagnostic Studies: Dg Chest 2 View  Result Date: 10/29/2017 CLINICAL DATA:  Preoperative examination prior to knee joint replacement. History of hypertension, gastroesophageal reflux, nonsmoker. EXAM: CHEST  2 VIEW COMPARISON:  CT scan chest of Feb 10, 2016 FINDINGS: The lungs are well-expanded. There is no focal infiltrate. There is no pleural effusion. The heart and pulmonary vascularity are normal. The mediastinum is normal in width. There is gentle reverse S-shaped cervicothoracic scoliosis. IMPRESSION: There is no active cardiopulmonary disease. Please note coronary artery calcifications were observed on the May 2017 CT scan. Electronically Signed   By: David  Martinique M.D.   On: 10/29/2017 13:50   Dg Knee Right Port  Result Date: 11/08/2017 CLINICAL DATA:  Postop RIGHT total knee replacement EXAM: PORTABLE RIGHT KNEE - 1-2 VIEW COMPARISON:  None. FINDINGS: Status post RIGHT TKR. Satisfactory femoral and tibial components. No adverse features. IMPRESSION: As above. Electronically Signed   By: Staci Righter M.D.   On: 11/08/2017 15:32    Disposition: 01-Home or Self Care    Follow-up Information    Leandrew Koyanagi, MD Follow up in 2 week(s).   Specialty:  Orthopedic Surgery Why:  For suture removal, For wound re-check Contact information: [place] [phone]        [facility] Follow up.   Specialty:  [facility] Why:  A representative from [facility] will contact you to arrange start date and time for your therapy. Contact information: [address] [place] [address] [phone]            Signed: Eduard Miller 11/09/2017, 2:33 PM

## 2017-11-09 NOTE — Progress Notes (Signed)
Physical Therapy Treatment Patient Details Name: [name] MRN: [id number] DOB: August 17, 1956 Today's Date: 11/09/2017    History of Present Illness 62 y.o. female s/p R TKA 2/14. PMH includes: Anxiety, HTN, PONV, Spinal Stenosis, C spine surgery.     PT Comments    Patient is making progress toward mobility goals. Pt with limited gait distance due to feeling "hot" and flushed and requested to limit mobility. Continue to progress as tolerated.    Follow Up Recommendations  Home health PT;Supervision for mobility/OOB;Follow surgeon's recommendation for DC plan and follow-up therapies     Equipment Recommendations  (TBD)    Recommendations for Other Services       Precautions / Restrictions Precautions Precautions: Fall Precaution Comments: positioning/precautions reviewed with pt Restrictions Weight Bearing Restrictions: Yes RLE Weight Bearing: Weight bearing as tolerated    Mobility  Bed Mobility Overal bed mobility: Needs Assistance Bed Mobility: Supine to Sit     Supine to sit: Supervision     General bed mobility comments: supervision for safety; HOB flat and no use of rails  Transfers Overall transfer level: Needs assistance Equipment used: Rolling walker (2 wheeled) Transfers: Sit to/from Stand Sit to Stand: Min guard         General transfer comment: cues for safe hand placement  Ambulation/Gait Ambulation/Gait assistance: Min guard Ambulation Distance (Feet): 70 Feet Assistive device: Rolling walker (2 wheeled) Gait Pattern/deviations: Step-to pattern;Decreased stance time - right;Decreased step length - left;Decreased dorsiflexion - right;Decreased weight shift to right Gait velocity: decreased   General Gait Details: cues for sequencing, proximity to RW, R heel strike/toe off, and engaging R quad during stance phase    Stairs            Wheelchair Mobility    Modified Rankin (Stroke Patients Only)       Balance Overall balance  assessment: Needs assistance   Sitting balance-Leahy Scale: Good       Standing balance-Leahy Scale: Poor                              Cognition Arousal/Alertness: Awake/alert Behavior During Therapy: WFL for tasks assessed/performed;Anxious Overall Cognitive Status: Within Functional Limits for tasks assessed                                        Exercises      General Comments General comments (skin integrity, edema, etc.): pt reported feeling flushed while ambulating; pt with red ring around L LE from top of TED hose being too tight--RN notifited and in to assess skin integrity--TED hose doffed      Pertinent Vitals/Pain Pain Assessment: Faces Faces Pain Scale: Hurts little more Pain Location: R knee Pain Descriptors / Indicators: Guarding;Sore Pain Intervention(s): Limited activity within patient's tolerance;Monitored during session;Premedicated before session;Repositioned    Home Living                      Prior Function            PT Goals (current goals can now be found in the care plan section) Acute Rehab PT Goals Patient Stated Goal: feel better PT Goal Formulation: With patient Time For Goal Achievement: 11/15/17 Potential to Achieve Goals: Good Progress towards PT goals: Progressing toward goals    Frequency    7X/week  PT Plan Current plan remains appropriate    Co-evaluation              AM-PAC PT "6 Clicks" Daily Activity  Outcome Measure  Difficulty turning over in bed (including adjusting bedclothes, sheets and blankets)?: A Little Difficulty moving from lying on back to sitting on the side of the bed? : A Little Difficulty sitting down on and standing up from a chair with arms (e.g., wheelchair, bedside commode, etc,.)?: Unable Help needed moving to and from a bed to chair (including a wheelchair)?: A Little Help needed walking in hospital room?: A Little Help needed climbing 3-5 steps  with a railing? : A Lot 6 Click Score: 15    End of Session Equipment Utilized During Treatment: Gait belt Activity Tolerance: Patient tolerated treatment well Patient left: in chair;with call bell/phone within reach Nurse Communication: Mobility status PT Visit Diagnosis: Unsteadiness on feet (R26.81);Other abnormalities of gait and mobility (R26.89);Muscle weakness (generalized) (M62.81);Pain Pain - Right/Left: Right Pain - part of body: Knee     Time: 1020-1056 PT Time Calculation (min) (ACUTE ONLY): 36 min  Charges:  $Gait Training: 8-22 mins $Therapeutic Activity: 8-22 mins                    G Codes:       Earney Navy, PTA Pager: [phone]     Darliss Cheney 11/09/2017, 11:04 AM

## 2017-11-09 NOTE — Care Management Note (Signed)
Case Management Note  Patient Details  Name: [name] MRN: [id number] Date of Birth: Sep 01, 1956  Subjective/Objective:   62 yr old female s/p right total knee arthroplasty.                 Action/Plan: Case manager spoke with patient concerning discharge plan and DME. Patient was preoperatively setup with [facility], no changes. She has DME and will have support of family at discharge, will also have [facility] services for a week. Patient has stressed importance of being discharged before dark, says she has drivers but they have night vision issues. CM will notify staff of same.    Expected Discharge Date:   10/20/17               Expected Discharge Plan:  [place]  In-House Referral:  NA  Discharge planning Services  CM Consult  Post Acute Care Choice:  Home Health Choice offered to:  Patient  DME Arranged:  (Has RW and 3in1) DME Agency:  NA  HH Arranged:  PT [place] Agency:  [facility] (formerly [employer])  Status of Service:  Completed, signed off  If discussed at [employer], dates discussed:    Additional Comments:  Ninfa Meeker, RN 11/09/2017, 11:26 AM

## 2017-11-09 NOTE — Progress Notes (Signed)
Orthopedic Tech Progress Note Patient Details:  [name] [birth year] [id number]  CPM Right Knee CPM Right Knee: On Right Knee Flexion (Degrees): 90 Right Knee Extension (Degrees): 0 Additional Comments: applied at 1548  Post Interventions Patient Tolerated: Well Instructions Provided: Care of device  [name] 11/09/2017, 3:08 PM

## 2017-11-09 NOTE — Evaluation (Signed)
Occupational Therapy Evaluation Patient Details Name: [name] MRN: [id number] DOB: 05/02/1956 Today's Date: 11/09/2017    History of Present Illness 62 y.o. female s/p R TKA 2/14. PMH includes: Anxiety, HTN, PONV, Spinal Stenosis, C spine surgery.    Clinical Impression   Patient is s/p R TKA  surgery resulting in functional limitations due to the deficits listed below (see OT problem list). Pt requires min guard (A) at this time and has concerns with the height of bed at home. Pt very concerned with getting items provided to her for free. Pt has necessary DME and AE at this time from OT standpoint.  Patient will benefit from skilled OT acutely to increase independence and safety with ADLS to allow discharge [place].     Follow Up Recommendations  Home health OT    Equipment Recommendations  None recommended by OT    Recommendations for Other Services       Precautions / Restrictions Precautions Precautions: Fall Precaution Comments: educated on back precautions during session as well due to new onset of L hip to knee pain reported by patient as a concern with movement during OT eval Restrictions Weight Bearing Restrictions: No RLE Weight Bearing: Weight bearing as tolerated      Mobility Bed Mobility Overal bed mobility: Needs Assistance Bed Mobility: Supine to Sit     Supine to sit: Supervision     General bed mobility comments: in chair ona rrival  Transfers Overall transfer level: Needs assistance Equipment used: Rolling walker (2 wheeled) Transfers: Sit to/from Stand Sit to Stand: Min guard         General transfer comment: cues for safe hand placement    Balance Overall balance assessment: Needs assistance   Sitting balance-Leahy Scale: Good       Standing balance-Leahy Scale: Poor                             ADL either performed or assessed with clinical judgement   ADL Overall ADL's : Needs assistance/impaired Eating/Feeding:  Independent   Grooming: Min guard   Upper Body Bathing: Min guard   Lower Body Bathing: Minimal assistance Lower Body Bathing Details (indicate cue type and reason): able to reach toes and demonstrates during session         Toilet Transfer: Min Psychiatric nurse Details (indicate cue type and reason): has a toilet riser at home       Tub/Shower Transfer Details (indicate cue type and reason): plans to sponge bath only  Functional mobility during ADLs: Min guard;Rolling walker General ADL Comments: educated on dressing and bathing. requirement for good back health, bed mobility due to elevated surface at home, pt asking alot of questions about getting items given to her for free. pt states "always feels good to leave with something that is given to you " pt educated that reachers are a personal purchase item and where in the community and at [facility] can purchase this AE. pt educated that insurance does not cover AE. Pt reports having sock aide somewhere at home. pt is able to reach toes    Educated patient on knee full extension with return demonstration,r,never to wash directly on incision site, always use fresh clean linen (one time use then place in laundry), avoid water under bandage and benefits of wrapping dressing, sleeping positioning, avoid putting pillow under knee, educated on use of a belt or sheet as a leg lifter  Vision Baseline Vision/History: Wears glasses Wears Glasses: At all times       Perception     Praxis      Pertinent Vitals/Pain Pain Assessment: Faces Faces Pain Scale: Hurts a little bit Pain Location: R knee Pain Descriptors / Indicators: Guarding;Sore Pain Intervention(s): Monitored during session;Premedicated before session;Repositioned     Hand Dominance Right   Extremity/Trunk Assessment Upper Extremity Assessment Upper Extremity Assessment: Overall WFL for tasks assessed   Lower Extremity Assessment Lower Extremity Assessment: Defer to PT  evaluation   Cervical / Trunk Assessment Cervical / Trunk Assessment: Other exceptions(L leg pain in relation related to back changes)   Communication Communication Communication: No difficulties   Cognition Arousal/Alertness: Awake/alert Behavior During Therapy: WFL for tasks assessed/performed;Anxious Overall Cognitive Status: Within Functional Limits for tasks assessed                                     General Comments  pt has dry ace wrap at this time    Exercises     Shoulder Instructions      Home Living Family/patient expects to be discharged to:: Private residence Living Arrangements: Other relatives Available Help at Discharge: Available PRN/intermittently;Friend(s) Type of Home: House Home Access: [place]: One level     Bathroom Shower/Tub: Teacher, early years/pre: Standard Bathroom Accessibility: Yes   Home Equipment: Environmental consultant - 2 wheels;Wheelchair - manual;Cane - single point;Bedside commode          Prior Functioning/Environment Level of Independence: Independent with assistive device(s)        Comments: sister has been grocery shopping and assisting with some ADLs        OT Problem List: Decreased strength;Decreased activity tolerance;Impaired balance (sitting and/or standing);Decreased safety awareness;Decreased knowledge of use of DME or AE;Decreased knowledge of precautions;Obesity;Pain      OT Treatment/Interventions: Self-care/ADL training;Therapeutic exercise;Energy conservation;DME and/or AE instruction;Therapeutic activities;Patient/family education;Balance training    OT Goals(Current goals can be found in the care plan section) Acute Rehab OT Goals Patient Stated Goal: to work on bed mobility OT Goal Formulation: With patient Time For Goal Achievement: 11/23/17 Potential to Achieve Goals: Good  OT Frequency: Min 2X/week   Barriers to D/C:            Co-evaluation               AM-PAC PT "6 Clicks" Daily Activity     Outcome Measure Help from another person eating meals?: None Help from another person taking care of personal grooming?: A Little Help from another person toileting, which includes using toliet, bedpan, or urinal?: A Little Help from another person bathing (including washing, rinsing, drying)?: A Little Help from another person to put on and taking off regular upper body clothing?: A Little Help from another person to put on and taking off regular lower body clothing?: A Little 6 Click Score: 19   End of Session CPM Right Knee CPM Right Knee: Off Nurse Communication: Mobility status;Precautions;Weight bearing status  Activity Tolerance: Patient tolerated treatment well Patient left: in chair;with call bell/phone within reach  OT Visit Diagnosis: Unsteadiness on feet (R26.81)                Time: 2409-7353 OT Time Calculation (min): 25 min Charges:  OT General Charges $OT Visit: 1 Visit OT Evaluation $OT Eval Moderate Complexity: 1 Mod G-Codes:  Jeri Modena   OTR/L Pager: [phone] Office: [phone] .   Parke Poisson B 11/09/2017, 2:54 PM

## 2017-11-09 NOTE — Progress Notes (Signed)
Subjective: 1 Day Post-Op Procedure(s) (LRB): RIGHT TOTAL KNEE ARTHROPLASTY (Right) Patient reports pain as mild.  No nausea/vomiting, lightheadedness/dizziness.  Feeling pretty good this am.  Objective: Vital signs in last 24 hours: Temp:  [97.7 F (36.5 C)-98.4 F (36.9 C)] 98.1 F (36.7 C) (02/15 0426) Pulse Rate:  [67-80] 71 (02/15 0700) Resp:  [12-23] 18 (02/15 0013) BP: (116-190)/(54-125) 129/69 (02/15 0700) SpO2:  [97 %-100 %] 98 % (02/15 0426) Weight:  [203 lb 1.6 oz (92.1 kg)] 203 lb 1.6 oz (92.1 kg) (02/14 1043)  Intake/Output from previous day: 02/14 0701 - 02/15 0700 In: [place] [P.O.:240; I.V.:1600] Out: 700 [Urine:650; Blood:50] Intake/Output this shift: No intake/output data recorded.  No results for input(s): HGB in the last 72 hours. No results for input(s): WBC, RBC, HCT, PLT in the last 72 hours. No results for input(s): NA, K, CL, CO2, BUN, CREATININE, GLUCOSE, CALCIUM in the last 72 hours. No results for input(s): LABPT, INR in the last 72 hours.  Neurologically intact Neurovascular intact Sensation intact distally Intact pulses distally Dorsiflexion/Plantar flexion intact Incision: dressing C/D/I No cellulitis present Compartment soft  Assessment/Plan: 1 Day Post-Op Procedure(s) (LRB): RIGHT TOTAL KNEE ARTHROPLASTY (Right) Advance diet Up with therapy D/C IV fluids Plan for discharge tomorrow home with hhpt WBAT RLE   [name] 11/09/2017, 7:51 AM

## 2017-11-09 NOTE — Progress Notes (Signed)
Physical Therapy Treatment Patient Details Name: [name] MRN: [id number] DOB: 07/08/1956 Today's Date: 11/09/2017    History of Present Illness 62 y.o. female s/p R TKA 2/14. PMH includes: Anxiety, HTN, PONV, Spinal Stenosis, C spine surgery.     PT Comments    Patient is progressing well toward mobility goals and tolerated increased gait distance and completed HEP with cues. Pt overall required supervision/min guard assist. Current plan remains appropriate.    Follow Up Recommendations  Home health PT;Supervision for mobility/OOB;Follow surgeon's recommendation for DC plan and follow-up therapies     Equipment Recommendations  (TBD)    Recommendations for Other Services       Precautions / Restrictions Precautions Precautions: Fall Precaution Comments: positioning/precautions reviewed with pt Restrictions Weight Bearing Restrictions: Yes RLE Weight Bearing: Weight bearing as tolerated    Mobility  Bed Mobility               General bed mobility comments: pt OOB in chair upon arrival  Transfers Overall transfer level: Needs assistance Equipment used: Rolling walker (2 wheeled) Transfers: Sit to/from Stand Sit to Stand: Supervision         General transfer comment: supervision for safety; demonstrated safe hand placement from recliner and BSC  Ambulation/Gait Ambulation/Gait assistance: Min guard;Supervision Ambulation Distance (Feet): 175 Feet Assistive device: Rolling walker (2 wheeled) Gait Pattern/deviations: Decreased stance time - right;Decreased step length - left;Step-through pattern;Step-to pattern Gait velocity: decreased   General Gait Details: cues for engaging R quad during stance phase, sequencing of gait with use of AD, and increased L step length; pt with improved bilat step length symmetry and step through pattern with increased distance   Stairs            Wheelchair Mobility    Modified Rankin (Stroke Patients Only)        Balance Overall balance assessment: Needs assistance   Sitting balance-Leahy Scale: Good       Standing balance-Leahy Scale: Fair Standing balance comment: pt able to static stand for hygiene without UE support                            Cognition Arousal/Alertness: Awake/alert Behavior During Therapy: WFL for tasks assessed/performed;Anxious Overall Cognitive Status: Within Functional Limits for tasks assessed                                        Exercises Total Joint Exercises Ankle Circles/Pumps: AROM;Both;10 reps Quad Sets: AROM;Right;10 reps Short Arc Quad: AROM;Right;10 reps Heel Slides: AROM;Right;10 reps Hip ABduction/ADduction: AROM;Right;10 reps Straight Leg Raises: AROM;Right;10 reps Long Arc Quad: AROM;Right;10 reps Knee Flexion: AROM;Right;10 reps;Seated;Other (comment)(10 sec holds) Goniometric ROM: 90 degrees flexion    General Comments General comments (skin integrity, edema, etc.): pt has dry ace wrap at this time      Pertinent Vitals/Pain Pain Assessment: Faces Faces Pain Scale: Hurts a little bit Pain Location: R knee Pain Descriptors / Indicators: Discomfort;Tightness Pain Intervention(s): Monitored during session;Repositioned;Ice applied    Home Living Family/patient expects to be discharged to:: Private residence Living Arrangements: Other relatives Available Help at Discharge: Available PRN/intermittently;Friend(s) Type of Home: House Home Access: Ramped entrance   Home Layout: One level Home Equipment: Environmental consultant - 2 wheels;Wheelchair - manual;Cane - single point;Bedside commode      Prior Function Level of Independence: Independent with assistive device(s)  Comments: sister has been grocery shopping and assisting with some ADLs   PT Goals (current goals can now be found in the care plan section) Acute Rehab PT Goals Patient Stated Goal: feel better PT Goal Formulation: With patient Time For Goal  Achievement: 11/15/17 Potential to Achieve Goals: Good Progress towards PT goals: Progressing toward goals    Frequency    7X/week      PT Plan Current plan remains appropriate    Co-evaluation              AM-PAC PT "6 Clicks" Daily Activity  Outcome Measure  Difficulty turning over in bed (including adjusting bedclothes, sheets and blankets)?: A Little Difficulty moving from lying on back to sitting on the side of the bed? : A Little Difficulty sitting down on and standing up from a chair with arms (e.g., wheelchair, bedside commode, etc,.)?: A Lot Help needed moving to and from a bed to chair (including a wheelchair)?: A Little Help needed walking in hospital room?: A Little Help needed climbing 3-5 steps with a railing? : A Little 6 Click Score: 17    End of Session Equipment Utilized During Treatment: Gait belt Activity Tolerance: Patient tolerated treatment well Patient left: in chair;with call bell/phone within reach Nurse Communication: Mobility status PT Visit Diagnosis: Unsteadiness on feet (R26.81);Other abnormalities of gait and mobility (R26.89);Muscle weakness (generalized) (M62.81);Pain Pain - Right/Left: Right Pain - part of body: Knee     Time: 1415-1500 PT Time Calculation (min) (ACUTE ONLY): 45 min  Charges:  $Gait Training: 23-37 mins $Therapeutic Exercise: 8-22 mins                    G Codes:       Earney Navy, PTA Pager: [phone]     Darliss Cheney 11/09/2017, 3:18 PM

## 2017-11-10 NOTE — Progress Notes (Signed)
Physical Therapy Treatment Patient Details Name: [name] MRN: [id number] DOB: [birth year] Today's Date: 11/10/2017    History of Present Illness 62 y.o. female s/p R TKA 2/14. PMH includes: Anxiety, HTN, PONV, Spinal Stenosis, C spine surgery.     PT Comments    Reviewed supine HEP with pt. Pt nauseous with mobility this AM. While ambulating to restroom pt became overheated and nauseous requiring rest break. RN notified pt is requesting pain and nausea medications. Pt unable to recover with time and requested to remain on toilet until meds arrived. Pt left in restroom and RN notified.      Follow Up Recommendations  Home health PT;Supervision for mobility/OOB;Follow surgeon's recommendation for DC plan and follow-up therapies     Equipment Recommendations  (TBD)    Recommendations for Other Services       Precautions / Restrictions Precautions Precautions: Fall Precaution Comments: positioning/precautions reviewed with pt Restrictions Weight Bearing Restrictions: Yes RLE Weight Bearing: Weight bearing as tolerated LLE Weight Bearing: Weight bearing as tolerated    Mobility  Bed Mobility Overal bed mobility: Needs Assistance Bed Mobility: Supine to Sit     Supine to sit: Supervision     General bed mobility comments: cues for technique. Pt using UEs to assist R LE off EOB  Transfers Overall transfer level: Needs assistance Equipment used: Rolling walker (2 wheeled) Transfers: Sit to/from Stand Sit to Stand: Supervision         General transfer comment: supervision for safety.  Ambulation/Gait Ambulation/Gait assistance: Min guard;Supervision Ambulation Distance (Feet): 15 Feet Assistive device: Rolling walker (2 wheeled) Gait Pattern/deviations: Decreased stance time - right;Decreased step length - left;Step-through pattern;Step-to pattern Gait velocity: decreased Gait velocity interpretation: Below normal speed for age/gender General Gait Details: Pt  requested assist to restroom. During gait she became nauseous and over heated   Stairs            Wheelchair Mobility    Modified Rankin (Stroke Patients Only)       Balance Overall balance assessment: Needs assistance   Sitting balance-Leahy Scale: Good       Standing balance-Leahy Scale: Fair                              Cognition Arousal/Alertness: Awake/alert Behavior During Therapy: WFL for tasks assessed/performed;Anxious Overall Cognitive Status: Within Functional Limits for tasks assessed                                        Exercises Total Joint Exercises Ankle Circles/Pumps: AROM;Both;10 reps;Supine Quad Sets: AROM;Right;5 reps;Supine Towel Squeeze: AROM;Right;10 reps;Supine Short Arc Quad: AROM;Right;5 reps;Supine Heel Slides: AROM;Right;5 reps;Supine Hip ABduction/ADduction: AROM;Right;5 reps;Supine Straight Leg Raises: AROM;Right;5 reps;Supine    General Comments        Pertinent Vitals/Pain Pain Assessment: Faces Faces Pain Scale: Hurts whole lot Pain Location: R knee Pain Descriptors / Indicators: Discomfort;Tightness Pain Intervention(s): Monitored during session;Limited activity within patient's tolerance;Repositioned;Patient requesting pain meds-RN notified;RN gave pain meds during session    Home Living                      Prior Function            PT Goals (current goals can now be found in the care plan section) Acute Rehab PT Goals Patient Stated Goal: feel better PT  Goal Formulation: With patient Time For Goal Achievement: 11/15/17 Potential to Achieve Goals: Good Progress towards PT goals: Progressing toward goals    Frequency    7X/week      PT Plan Current plan remains appropriate    Co-evaluation              AM-PAC PT "6 Clicks" Daily Activity  Outcome Measure  Difficulty turning over in bed (including adjusting bedclothes, sheets and blankets)?: A  Little Difficulty moving from lying on back to sitting on the side of the bed? : A Little Difficulty sitting down on and standing up from a chair with arms (e.g., wheelchair, bedside commode, etc,.)?: Unable Help needed moving to and from a bed to chair (including a wheelchair)?: A Little Help needed walking in hospital room?: A Little Help needed climbing 3-5 steps with a railing? : A Little 6 Click Score: 16    End of Session Equipment Utilized During Treatment: Gait belt Activity Tolerance: Other (comment)(Limited by nausea) Patient left: (in restroom seated at toilet. ) Nurse Communication: Mobility status;Patient requests pain meds(Pts current location) PT Visit Diagnosis: Unsteadiness on feet (R26.81);Other abnormalities of gait and mobility (R26.89);Muscle weakness (generalized) (M62.81);Pain Pain - Right/Left: Right Pain - part of body: Knee     Time: 1308-6578 PT Time Calculation (min) (ACUTE ONLY): 42 min  Charges:  $Therapeutic Exercise: 8-22 mins $Therapeutic Activity: 23-37 mins                    G Codes:       Benjiman Core, [place] Pager [phone] Acute Rehab   Allena Katz 11/10/2017, 10:25 AM

## 2017-11-10 NOTE — Progress Notes (Signed)
Patient was provided with discharge instructions, written and verbal.  The patient verbalizes understanding these instructions.  Patient is aware of the importance of follow up.

## 2017-11-10 NOTE — Progress Notes (Signed)
Occupational Therapy Treatment Patient Details Name: [name] MRN: [id number] DOB: 05-09-1956 Today's Date: 11/10/2017    History of present illness 62 y.o. female s/p R TKA 2/14. PMH includes: Anxiety, HTN, PONV, Spinal Stenosis, C spine surgery.    OT comments  Pt progressing towards established OT goals. Pt performing toileting, LB bathing, and LB dressing with Min Guard for safety. Educated pt on safe bed mobility for carry over to home. Answering all pt questions in preparation for dc later today. All acute OT needs met and will sign off.    Follow Up Recommendations  Home health OT    Equipment Recommendations  None recommended by OT    Recommendations for Other Services      Precautions / Restrictions Precautions Precautions: Fall Precaution Comments: positioning/precautions reviewed with pt Restrictions Weight Bearing Restrictions: Yes RLE Weight Bearing: Weight bearing as tolerated LLE Weight Bearing: Weight bearing as tolerated       Mobility Bed Mobility Overal bed mobility: Needs Assistance Bed Mobility: Supine to Sit     Supine to sit: Supervision     General bed mobility comments: Discussed bed mobility at home and safe technique  Transfers Overall transfer level: Needs assistance Equipment used: Rolling walker (2 wheeled) Transfers: Sit to/from Stand Sit to Stand: Supervision         General transfer comment: supervision for safety.    Balance Overall balance assessment: Needs assistance   Sitting balance-Leahy Scale: Good       Standing balance-Leahy Scale: Fair Standing balance comment: pt able to static stand for hygiene without UE support                           ADL either performed or assessed with clinical judgement   ADL Overall ADL's : Needs assistance/impaired     Grooming: Wash/dry hands;Min guard;Standing       Lower Body Bathing: Min guard;Sit to/from stand Lower Body Bathing Details (indicate cue  type and reason): Min Guard for peri care Upper Body Dressing : Set up;Sitting Upper Body Dressing Details (indicate cue type and reason): donning night gown Lower Body Dressing: Min guard;Sit to/from stand Lower Body Dressing Details (indicate cue type and reason): Min Guard for safety and donning dependends Toilet Transfer: Supervision/safety;Ambulation;BSC         Tub/Shower Transfer Details (indicate cue type and reason): plans to sponge bath only  Functional mobility during ADLs: Min guard;Rolling walker General ADL Comments: Pt performing toileting, LB bathing, and LB dressing with Min guard for safety. Pt very careful throughout session. Educated pt on safe bed mobility and techniques for performing bed mobility at bed at home (which pt reports is higher).     Vision       Perception     Praxis      Cognition Arousal/Alertness: Awake/alert Behavior During Therapy: WFL for tasks assessed/performed;Anxious Overall Cognitive Status: Within Functional Limits for tasks assessed                                          Exercises    Shoulder Instructions       General Comments      Pertinent Vitals/ Pain       Pain Assessment: Faces Faces Pain Scale: Hurts even more Pain Location: R knee Pain Descriptors / Indicators: Discomfort;Tightness Pain Intervention(s): Monitored during session;Limited  activity within patient's tolerance;Repositioned  Home Living                                          Prior Functioning/Environment              Frequency  Min 2X/week        Progress Toward Goals  OT Goals(current goals can now be found in the care plan section)  Progress towards OT goals: Progressing toward goals;Goals met/education completed, patient discharged from OT  Acute Rehab OT Goals Patient Stated Goal: feel better OT Goal Formulation: With patient Time For Goal Achievement: 11/23/17 Potential to Achieve Goals:  Good ADL Goals Pt Will Transfer to Toilet: with supervision;bedside commode;ambulating Additional ADL Goal #1: Pt will complete bed mobility supervision level with leg lifter as needed as a precursor to [place] Discharge plan remains appropriate    Co-evaluation                 AM-PAC PT "6 Clicks" Daily Activity     Outcome Measure   Help from another person eating meals?: None Help from another person taking care of personal grooming?: A Little Help from another person toileting, which includes using toliet, bedpan, or urinal?: A Little Help from another person bathing (including washing, rinsing, drying)?: A Little Help from another person to put on and taking off regular upper body clothing?: A Little Help from another person to put on and taking off regular lower body clothing?: A Little 6 Click Score: 19    End of Session Equipment Utilized During Treatment: Gait belt;Rolling walker CPM Right Knee CPM Right Knee: Off  OT Visit Diagnosis: Unsteadiness on feet (R26.81)   Activity Tolerance Patient tolerated treatment well   Patient Left in chair;with call bell/phone within reach   Nurse Communication Mobility status;Precautions;Weight bearing status        Time: 5003-7048 OT Time Calculation (min): 28 min  Charges: OT General Charges $OT Visit: 1 Visit OT Treatments $Self Care/Home Management : 23-37 mins  [place], OTR/L Acute Rehab Pager: [phone] Office: [place] 11/10/2017, 11:09 AM

## 2017-11-10 NOTE — Progress Notes (Signed)
Patient patient ID: [name], female   DOB: 1956-02-23, 62 y.o.   MRN: [id number] Patient is status post right total knee arthroplasty.  She is progressed well with physical therapy plan for discharge to home today.  Discussed the importance of knee extension exercises.

## 2017-11-10 NOTE — Discharge Summary (Signed)
Discharge Diagnoses:  Active Problems:   Total knee replacement status   Surgeries: Procedure(s): RIGHT TOTAL KNEE ARTHROPLASTY on 11/08/2017    Consultants:   Discharged Condition: Improved  Hospital Course: [name] is an 62 y.o. female who was admitted 11/08/2017 with a chief complaint of osteoarthritis right knee, with a final diagnosis of right knee osteoarthritis.  Patient was brought to the operating room on 11/08/2017 and underwent Procedure(s): RIGHT TOTAL KNEE ARTHROPLASTY.    Patient was given perioperative antibiotics:  Anti-infectives (From admission, onward)   Start     Dose/Rate Route Frequency Ordered Stop   11/08/17 1830  ceFAZolin (ANCEF) IVPB 2g/100 mL premix     2 g 200 mL/hr over 30 Minutes Intravenous Every 6 hours 11/08/17 1710 11/09/17 0600   11/08/17 1413  vancomycin (VANCOCIN) powder  Status:  Discontinued       As needed 11/08/17 1413 11/08/17 1455   11/08/17 1100  ceFAZolin (ANCEF) IVPB 2g/100 mL premix     2 g 200 mL/hr over 30 Minutes Intravenous To ShortStay Surgical 11/07/17 1024 11/08/17 1235    .  Patient was given sequential compression devices, early ambulation, and aspirin for DVT prophylaxis.  Recent vital signs:  Patient Vitals for the past 24 hrs:  BP Temp Temp src Pulse Resp SpO2  11/10/17 0742 (!) 147/69 - - 81 - -  11/10/17 0414 132/60 98 F (36.7 C) Oral 73 18 96 %  11/09/17 2006 133/74 98.2 F (36.8 C) Oral 75 18 95 %  11/09/17 1300 (!) 130/58 98.2 F (36.8 C) Oral 85 18 98 %  .  Recent laboratory studies: Dg Knee Right Port  Result Date: 11/08/2017 CLINICAL DATA:  Postop RIGHT total knee replacement EXAM: PORTABLE RIGHT KNEE - 1-2 VIEW COMPARISON:  None. FINDINGS: Status post RIGHT TKR. Satisfactory femoral and tibial components. No adverse features. IMPRESSION: As above. Electronically Signed   By: Staci Righter M.D.   On: 11/08/2017 15:32    Discharge Medications:   Allergies as of 11/10/2017   No Known Allergies      Medication List    TAKE these medications   aspirin EC 325 MG tablet Take 1 tablet (325 mg total) by mouth 2 (two) times daily. What changed:    medication strength  how much to take  when to take this   azaTHIOprine 50 MG tablet Commonly known as:  IMURAN Take 1 tablet (50 mg total) by mouth daily. What changed:  when to take this   calcium carbonate 1500 (600 Ca) MG Tabs tablet Commonly known as:  OSCAL Take 1,500 mg by mouth daily.   carvedilol 25 MG tablet Commonly known as:  COREG TAKE 1 TABLET (25 MG TOTAL) BY MOUTH 2 (TWO) TIMES DAILY WITH A MEAL.   docusate sodium 100 MG capsule Commonly known as:  COLACE Take 100 mg by mouth daily.   folic acid 1 MG tablet Commonly known as:  FOLVITE Take 1 mg by mouth daily.   gabapentin 100 MG capsule Commonly known as:  NEURONTIN Take 3 capsules (300 mg total) by mouth 3 (three) times daily.   meclizine 25 MG tablet Commonly known as:  ANTIVERT Take 1 tablet (25 mg total) by mouth 3 (three) times daily.   NITROSTAT 0.4 MG SL tablet Generic drug:  nitroGLYCERIN Place 0.4 mg under the tongue every 5 (five) minutes as needed for chest pain.   omeprazole 20 MG capsule Commonly known as:  PRILOSEC Take 20 mg by mouth  daily.   ondansetron 4 MG tablet Commonly known as:  ZOFRAN Take 1 tablet (4 mg total) by mouth every 6 (six) hours as needed for nausea. What changed:  Another medication with the same name was added. Make sure you understand how and when to take each.   ondansetron 4 MG tablet Commonly known as:  ZOFRAN Take 1-2 tablets (4-8 mg total) by mouth every 8 (eight) hours as needed for nausea or vomiting. What changed:  You were already taking a medication with the same name, and this prescription was added. Make sure you understand how and when to take each.   OVER THE COUNTER MEDICATION Take 1 tablet by mouth daily. Vision Gold   oxyCODONE 10 mg 12 hr tablet Commonly known as:  OXYCONTIN Take 1  tablet (10 mg total) by mouth every 12 (twelve) hours.   oxyCODONE 5 MG immediate release tablet Commonly known as:  Oxy IR/ROXICODONE Take 1-3 tablets (5-15 mg total) by mouth every 4 (four) hours as needed.   pravastatin 20 MG tablet Commonly known as:  PRAVACHOL Take 20 mg by mouth every evening.   promethazine 25 MG tablet Commonly known as:  PHENERGAN Take 1 tablet (25 mg total) by mouth every 6 (six) hours as needed for nausea.   senna-docusate 8.6-50 MG tablet Commonly known as:  SENOKOT S Take 1 tablet by mouth at bedtime as needed.   tiZANidine 4 MG tablet Commonly known as:  ZANAFLEX Take 1 tablet (4 mg total) by mouth every 6 (six) hours as needed for muscle spasms.   Vitamin D 2000 units Caps Take 2,000 Units by mouth daily.            Durable Medical Equipment  (From admission, onward)        Start     Ordered   11/08/17 1711  DME Walker rolling  Once    Question:  Patient needs a walker to treat with the following condition  Answer:  Total knee replacement status   11/08/17 1710   11/08/17 1711  DME 3 n 1  Once     11/08/17 1710   11/08/17 1711  DME Bedside commode  Once    Question:  Patient needs a bedside commode to treat with the following condition  Answer:  Total knee replacement status   11/08/17 1710      Diagnostic Studies: Dg Chest 2 View  Result Date: 10/29/2017 CLINICAL DATA:  Preoperative examination prior to knee joint replacement. History of hypertension, gastroesophageal reflux, nonsmoker. EXAM: CHEST  2 VIEW COMPARISON:  CT scan chest of Feb 10, 2016 FINDINGS: The lungs are well-expanded. There is no focal infiltrate. There is no pleural effusion. The heart and pulmonary vascularity are normal. The mediastinum is normal in width. There is gentle reverse S-shaped cervicothoracic scoliosis. IMPRESSION: There is no active cardiopulmonary disease. Please note coronary artery calcifications were observed on the May 2017 CT scan. Electronically  Signed   By: David  Martinique M.D.   On: 10/29/2017 13:50   Dg Knee Right Port  Result Date: 11/08/2017 CLINICAL DATA:  Postop RIGHT total knee replacement EXAM: PORTABLE RIGHT KNEE - 1-2 VIEW COMPARISON:  None. FINDINGS: Status post RIGHT TKR. Satisfactory femoral and tibial components. No adverse features. IMPRESSION: As above. Electronically Signed   By: Staci Righter M.D.   On: 11/08/2017 15:32    Patient benefited maximally from their hospital stay and there were no complications.     Disposition: 01-Home or Self Care Discharge  Instructions    Call MD / Call 911   Complete by:  As directed    If you experience chest pain or shortness of breath, CALL 911 and be transported to the hospital emergency room.  If you develope a fever above 101 F, pus (white drainage) or increased drainage or redness at the wound, or calf pain, call your surgeon's office.   Constipation Prevention   Complete by:  As directed    Drink plenty of fluids.  Prune juice may be helpful.  You may use a stool softener, such as Colace (over the counter) 100 mg twice a day.  Use MiraLax (over the counter) for constipation as needed.   Diet - low sodium heart healthy   Complete by:  As directed    Increase activity slowly as tolerated   Complete by:  As directed      Follow-up Information    Leandrew Koyanagi, MD Follow up in 2 week(s).   Specialty:  Orthopedic Surgery Why:  For suture removal, For wound re-check Contact information: [place] [address] [phone]        [facility] Follow up.   Specialty:  [place] Why:  A representative from [facility] will contact you to arrange start date and time for your therapy. Contact information: [address] [place] [address] [phone]            Signed: Newt Minion 11/10/2017, 10:15 AM

## 2017-11-12 ENCOUNTER — Telehealth (INDEPENDENT_AMBULATORY_CARE_PROVIDER_SITE_OTHER): Payer: Self-pay | Admitting: Orthopaedic Surgery

## 2017-11-12 NOTE — Telephone Encounter (Signed)
Is this okay?

## 2017-11-12 NOTE — Telephone Encounter (Signed)
ok 

## 2017-11-12 NOTE — Telephone Encounter (Signed)
Called [name] no answer LMOM advised on orders. Okay per Mendel Ryder.

## 2017-11-12 NOTE — Telephone Encounter (Signed)
[name] with [facility] called needing verbal orders for HHPT 3 wk 2. The  Number to contact Verdis Frederickson is [phone]

## 2017-11-22 ENCOUNTER — Ambulatory Visit: Payer: Medicare Other | Admitting: Neurology

## 2017-11-22 ENCOUNTER — Ambulatory Visit (INDEPENDENT_AMBULATORY_CARE_PROVIDER_SITE_OTHER): Payer: Medicare Other

## 2017-11-22 ENCOUNTER — Other Ambulatory Visit (INDEPENDENT_AMBULATORY_CARE_PROVIDER_SITE_OTHER): Payer: Self-pay | Admitting: Orthopaedic Surgery

## 2017-11-22 ENCOUNTER — Ambulatory Visit (INDEPENDENT_AMBULATORY_CARE_PROVIDER_SITE_OTHER): Payer: Medicare Other | Admitting: Orthopaedic Surgery

## 2017-11-22 DIAGNOSIS — Z96651 Presence of right artificial knee joint: Secondary | ICD-10-CM

## 2017-11-22 NOTE — Progress Notes (Signed)
2 week TKA follow up plan  Patient presents for 2 week follow up after right total knee replacement.  The incision is clean, dry, and intact and healing very well. There is no drainage, erythema, or signs of infection. Motion is progressing nicely.  We have encouraged continued use of compression socks as well as aspirin for DVT prophylaxis, and to continue with physical therapy exercises to work on strength and endurance. TED hose gave her skin blisters.  Patient is progressing well. Reminders were given about signs to be aware of including redness, drainage, increased pain, fevers, calf pain, shortness of breath, or any concern should generate a phone call or a return to see [place] immediately. Will plan to follow up at 6 weeks post op for next evaluation with radiographs at that time including 3 view xrays of the right knee.

## 2017-11-26 ENCOUNTER — Telehealth (INDEPENDENT_AMBULATORY_CARE_PROVIDER_SITE_OTHER): Payer: Self-pay

## 2017-11-26 NOTE — Telephone Encounter (Signed)
[name] with [facility] called stating that they are needing patient's office notes, demographics, and a Rx to be faxed to [phone].  CB# is [phone].  Please advise.  Thank You.

## 2017-11-27 NOTE — Telephone Encounter (Signed)
Faxed to [facility]

## 2017-12-18 DIAGNOSIS — R350 Frequency of micturition: Secondary | ICD-10-CM | POA: Insufficient documentation

## 2017-12-18 DIAGNOSIS — G2581 Restless legs syndrome: Secondary | ICD-10-CM | POA: Insufficient documentation

## 2017-12-20 ENCOUNTER — Ambulatory Visit (INDEPENDENT_AMBULATORY_CARE_PROVIDER_SITE_OTHER): Payer: Medicare Other

## 2017-12-20 ENCOUNTER — Ambulatory Visit (INDEPENDENT_AMBULATORY_CARE_PROVIDER_SITE_OTHER): Payer: Medicare Other | Admitting: Orthopaedic Surgery

## 2017-12-20 DIAGNOSIS — Z96651 Presence of right artificial knee joint: Secondary | ICD-10-CM | POA: Diagnosis not present

## 2017-12-20 MED ORDER — AMOXICILLIN 500 MG PO CAPS: 2000.0000 mg | ORAL_CAPSULE | Freq: Once | ORAL | 6 refills | Status: DC

## 2017-12-20 MED ORDER — TRAZODONE HCL 50 MG PO TABS: 50.0000 mg | ORAL_TABLET | Freq: Every day | ORAL | 3 refills | Status: DC

## 2017-12-20 NOTE — Addendum Note (Signed)
Addended by: Azucena Cecil on: 12/20/2017 09:53 AM   Modules accepted: Orders

## 2017-12-20 NOTE — Progress Notes (Addendum)
Post-Op Visit Note   Patient: [name]           Date of Birth: Apr 03, 1956           MRN: [id number] Visit Date: 12/20/2017 PCP: Crist Infante, MD   Assessment & Plan:  Chief Complaint:  Chief Complaint  Patient presents with  . Right Knee - Routine Post Op   Visit Diagnoses:  1. History of total right knee replacement   2. Status post right knee replacement     Plan: 6 week TKA follow up plan  Patient presents for follow up 6 weeks status post right total knee replacement. The wound is healed and there is no evidence of infection. TED hose may be discontinued. Radiographs reveal a total knee arthroplasty in good position, with no evidence of subsidence, loosening, or complicating features. Patient should refrain from any dental procedures, colonoscopies, etc until 3 months postop.  Reminders were given about signs to be aware of including redness, drainage, increased pain, fevers, calf pain, shortness of breath, or any concern should generate a phone call or a return to see [place] immediately. Will plan to follow up at 3 months postoperatively for next evaluation. Trazodone for burning aching pain at night to help with sleep.   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  We also provided her with a note to excuse her from jury duty for the next 6 months as a result of her recent surgery.   Follow-Up Instructions: Return in about 6 weeks (around 01/31/2018).   Orders:  Orders Placed This Encounter  Procedures  . XR KNEE 3 VIEW RIGHT   Meds ordered this encounter  Medications  . traZODone (DESYREL) 50 MG tablet    Sig: Take 1-2 tablets (50-100 mg total) by mouth at bedtime.    Dispense:  20 tablet    Refill:  3  . amoxicillin (AMOXIL) 500 MG capsule    Sig: Take 4 capsules (2,000 mg total) by mouth once for 1 dose.    Dispense:  4 capsule    Refill:  6    Imaging: Xr Knee 3 View Right  Result Date: 12/20/2017 Stable total knee replacement without  complication   PMFS History: Patient Active Problem List   Diagnosis Date Noted  . Total knee replacement status 11/08/2017  . Benign paroxysmal positional vertigo of right ear 10/03/2017  . Vertigo 09/25/2017  . GERD (gastroesophageal reflux disease) 09/25/2017  . HLD (hyperlipidemia) 09/25/2017  . Obesity 09/25/2017  . Intractable vomiting with nausea   . Memory loss 05/21/2017  . Acute low back pain with sciatica 12/08/2016  . Lumbar radiculopathy 12/07/2015  . Abnormality of gait 12/07/2015  . Cervical stenosis of spinal canal 11/02/2015  . Low back pain 10/19/2015  . Spinal stenosis in cervical region 10/19/2015  . Bilateral hip pain 10/19/2015  . Cervical spondylosis without myelopathy 08/10/2015  . Excessive daytime sleepiness 08/10/2015  . Right optic neuritis 08/10/2015  . Abnormal finding on MRI of brain 08/10/2015  . Cataract of left eye 08/03/2015  . Detached retina 08/03/2015  . Carotid artery narrowing 07/21/2015  . Abnormal glucose level 07/21/2015  . Hypertensive retinopathy 07/19/2015  . Edema, retina 07/19/2015  . Optic neuritis 07/10/2015  . Hypertension 07/10/2015  . Arthritis 07/10/2015  . Chest pain 01/21/2015  . Leg pain 01/21/2015  . Pseudoaphakia 12/22/2013  . 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 Blepharitis 12/12/2011  .  Error, refractive, myopia 12/12/2011  . Posterior vitreous detachment 12/12/2011   Past Medical History:  Diagnosis Date  . Anxiety   . Arthritis   . Deviated septum   . Dyspnea    with exertion   wheezing at night  . Fibroid tumor   . GERD (gastroesophageal reflux disease)   . Hepatitis    AS CHILD    . Hypercholesteremia   . Hypertension   . Memory loss   . Meniscus tear   . MRSA infection   . PONV (postoperative nausea and vomiting)   . Retinal detachment   . Retinal detachment   . Spinal stenosis   . Urinary frequency     Family History  Problem Relation Age  of Onset  . Stroke Mother   . Arrhythmia Mother   . Bladder Cancer Mother   . Thyroid disease Mother   . Heart disease Father   . Heart attack Father   . Stroke Maternal Grandfather   . Stroke Maternal Grandmother     Past Surgical History:  Procedure Laterality Date  . ANTERIOR CERVICAL DECOMP/DISCECTOMY FUSION N/A 11/02/2015   Procedure: ANTERIOR CERVICAL DECOMPRESSION FUSION CERVICAL FIVE-SIX,CERVICAL SIX-SEVEN;  Surgeon: Karie Chimera, MD;  Location: [facility];  Service: Neurosurgery;  Laterality: N/A;  right side approach  . CATARACT EXTRACTION Right   . fibroid tumor    . KNEE SURGERY Bilateral   . RETINAL DETACHMENT SURGERY Right   . TOTAL KNEE ARTHROPLASTY Right 11/08/2017  . TOTAL KNEE ARTHROPLASTY Right 11/08/2017   Procedure: RIGHT TOTAL KNEE ARTHROPLASTY;  Surgeon: Leandrew Koyanagi, MD;  Location: [facility];  Service: Orthopedics;  Laterality: Right;   Social History   Occupational History  . Occupation: caregiver    Comment: Part-time  Tobacco Use  . Smoking status: Never Smoker  . Smokeless tobacco: Never Used  Substance and Sexual Activity  . Alcohol use: No  . Drug use: No  . Sexual activity: Not on file

## 2017-12-24 ENCOUNTER — Other Ambulatory Visit: Payer: Self-pay | Admitting: Neurology

## 2018-01-17 ENCOUNTER — Ambulatory Visit: Payer: Medicare Other | Admitting: Adult Health

## 2018-02-01 ENCOUNTER — Ambulatory Visit (INDEPENDENT_AMBULATORY_CARE_PROVIDER_SITE_OTHER): Payer: Medicare Other | Admitting: Orthopaedic Surgery

## 2018-02-01 ENCOUNTER — Encounter (INDEPENDENT_AMBULATORY_CARE_PROVIDER_SITE_OTHER): Payer: Self-pay | Admitting: Orthopaedic Surgery

## 2018-02-01 DIAGNOSIS — Z96651 Presence of right artificial knee joint: Secondary | ICD-10-CM

## 2018-02-01 MED ORDER — TIZANIDINE HCL 4 MG PO TABS: 4.0000 mg | ORAL_TABLET | Freq: Four times a day (QID) | ORAL | 2 refills | Status: DC | PRN

## 2018-02-01 NOTE — Progress Notes (Signed)
Post-Op Visit Note   Patient: [name]           Date of Birth: [birth year]           MRN: [id number] Visit Date: 02/01/2018 PCP: Crist Infante, MD   Assessment & Plan:  Chief Complaint:  Chief Complaint  Patient presents with  . Right Knee - Pain   Visit Diagnoses:  1. History of total right knee replacement     Plan: 3 month TKA follow up plan  Patient now 3 months status post right total knee arthroplasty. Wound is healed with no signs of complications or infection.  The patient does not complain of pain, and is back to normal daily activities. It was reinforced that prophylactic antibiotics should be taken with any procedure including but not limited to dental work or colonoscopies.  We will plan on following up at the 6 month postop visit with 2 view xrays of the operative knee at that time. As always, instructions were given to call with any questions or concerns in the interim.   Follow-Up Instructions: Return in about 3 months (around 05/04/2018).   Orders:  No orders of the defined types were placed in this encounter.  Meds ordered this encounter  Medications  . tiZANidine (ZANAFLEX) 4 MG tablet    Sig: Take 1 tablet (4 mg total) by mouth every 6 (six) hours as needed for muscle spasms.    Dispense:  30 tablet    Refill:  2    Imaging: No results found.  PMFS History: Patient Active Problem List   Diagnosis Date Noted  . Total knee replacement status 11/08/2017  . Benign paroxysmal positional vertigo of right ear 10/03/2017  . Vertigo 09/25/2017  . GERD (gastroesophageal reflux disease) 09/25/2017  . HLD (hyperlipidemia) 09/25/2017  . Obesity 09/25/2017  . Intractable vomiting with nausea   . Memory loss 05/21/2017  . Acute low back pain with sciatica 12/08/2016  . Lumbar radiculopathy 12/07/2015  . Abnormality of gait 12/07/2015  . Cervical stenosis of spinal canal 11/02/2015  . Low back pain 10/19/2015  . Spinal stenosis in cervical region  10/19/2015  . Bilateral hip pain 10/19/2015  . Cervical spondylosis without myelopathy 08/10/2015  . Excessive daytime sleepiness 08/10/2015  . Right optic neuritis 08/10/2015  . Abnormal finding on MRI of brain 08/10/2015  . Cataract of left eye 08/03/2015  . Detached retina 08/03/2015  . Carotid artery narrowing 07/21/2015  . Abnormal glucose level 07/21/2015  . Hypertensive retinopathy 07/19/2015  . Edema, retina 07/19/2015  . Optic neuritis 07/10/2015  . Hypertension 07/10/2015  . Arthritis 07/10/2015  . Chest pain 01/21/2015  . Leg pain 01/21/2015  . Pseudoaphakia 12/22/2013  . 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 Blepharitis 12/12/2011  . Error, refractive, myopia 12/12/2011  . Posterior vitreous detachment 12/12/2011   Past Medical History:  Diagnosis Date  . Anxiety   . Arthritis   . Deviated septum   . Dyspnea    with exertion   wheezing at night  . Fibroid tumor   . GERD (gastroesophageal reflux disease)   . Hepatitis    AS CHILD    . Hypercholesteremia   . Hypertension   . Memory loss   . Meniscus tear   . MRSA infection   . PONV (postoperative nausea and vomiting)   . Retinal detachment   . Retinal detachment   . Spinal stenosis   . Urinary frequency  Family History  Problem Relation Age of Onset  . Stroke Mother   . Arrhythmia Mother   . Bladder Cancer Mother   . Thyroid disease Mother   . Heart disease Father   . Heart attack Father   . Stroke Maternal Grandfather   . Stroke Maternal Grandmother     Past Surgical History:  Procedure Laterality Date  . ANTERIOR CERVICAL DECOMP/DISCECTOMY FUSION N/A 11/02/2015   Procedure: ANTERIOR CERVICAL DECOMPRESSION FUSION CERVICAL FIVE-SIX,CERVICAL SIX-SEVEN;  Surgeon: Karie Chimera, MD;  Location: [facility];  Service: Neurosurgery;  Laterality: N/A;  right side approach  . CATARACT EXTRACTION Right   . fibroid tumor    . KNEE SURGERY Bilateral   .  RETINAL DETACHMENT SURGERY Right   . TOTAL KNEE ARTHROPLASTY Right 11/08/2017  . TOTAL KNEE ARTHROPLASTY Right 11/08/2017   Procedure: RIGHT TOTAL KNEE ARTHROPLASTY;  Surgeon: Leandrew Koyanagi, MD;  Location: [facility];  Service: Orthopedics;  Laterality: Right;   Social History   Occupational History  . Occupation: caregiver    Comment: Part-time  Tobacco Use  . Smoking status: Never Smoker  . Smokeless tobacco: Never Used  Substance and Sexual Activity  . Alcohol use: No  . Drug use: No  . Sexual activity: Not on file

## 2018-02-02 DIAGNOSIS — H353211 Exudative age-related macular degeneration, right eye, with active choroidal neovascularization: Secondary | ICD-10-CM | POA: Insufficient documentation

## 2018-03-14 ENCOUNTER — Ambulatory Visit (INDEPENDENT_AMBULATORY_CARE_PROVIDER_SITE_OTHER): Payer: Medicare Other

## 2018-03-14 ENCOUNTER — Ambulatory Visit: Payer: Self-pay

## 2018-03-14 ENCOUNTER — Ambulatory Visit (HOSPITAL_COMMUNITY)
Admission: EM | Admit: 2018-03-14 | Discharge: 2018-03-14 | Disposition: A | Discharge status: Home / Self Care | Payer: Medicare Other | Attending: Family Medicine | Admitting: Family Medicine

## 2018-03-14 ENCOUNTER — Encounter (HOSPITAL_COMMUNITY): Payer: Self-pay | Admitting: *Deleted

## 2018-03-14 DIAGNOSIS — M25511 Pain in right shoulder: Secondary | ICD-10-CM

## 2018-03-14 MED ORDER — IBUPROFEN 600 MG PO TABS: 600.0000 mg | ORAL_TABLET | Freq: Four times a day (QID) | ORAL | 0 refills | Status: DC | PRN

## 2018-03-14 NOTE — Telephone Encounter (Signed)
Pt. Reports she was walking her dog this morning and he pulled her and she fell on her right side. Right arm hurst at the upper arm. No bruising. Has been icing arm. Wiggles all fingers with normal sensation. Hurts when she tries to move upper arm. No availability at [facility] office. Pt. Will go to urgent care. Reason for Disposition . Can't move injured arm normally (bend or straighten completely)  Answer Assessment - Initial Assessment Questions 1. MECHANISM: "How did the injury happen?"     This morning - walking her dog and he pulled her and she fell. 2. ONSET: "When did the injury happen?" (Minutes or hours ago)      This morning 3. LOCATION: "Where is the injury located?"      Right upper arm 4. APPEARANCE of INJURY: "What does the injury look like?"      No bruising 5. SEVERITY: "Can you use the arm normally?"      No 6. SWELLING or BRUISING: "is there any swelling or bruising?" If so, ask: "How large is it? (e.g., inches, centimeters)      N/A 7. PAIN: "Is there pain?" If so, ask: "How bad is the pain?"    (Scale 1-10; or mild, moderate, severe)     10 8. TETANUS: For any breaks in the skin, ask: "When was the last tetanus booster?"     N/A 9. OTHER SYMPTOMS: "Do you have any other symptoms?"  (e.g., numbness in hand)     No numbness 10. PREGNANCY: "Is there any chance you are pregnant?" "When was your last menstrual period?"       No  Protocols used: ARM INJURY-A-AH

## 2018-03-14 NOTE — ED Triage Notes (Signed)
Patient states that she was walking her dog this afternoon when the dog pulled her down to the ground chasing a cat. Patient states she has been icing with no relief. Patient points to upper arm area, and reports pain with movement of right arm. Denies numbness or tingling distal to injury.

## 2018-03-14 NOTE — Discharge Instructions (Signed)
Please use sling for comfort, please slowly work on increasing the range of motion of your shoulder, please reference exercises provided in the back.  Use anti-inflammatories for pain/swelling. You may take up to 800 mg Ibuprofen every 8 hours with food. You may supplement Ibuprofen with Tylenol [phone] mg every 8 hours.   Please follow-up with orthopedics for further evaluation of possible rotator cuff injury.

## 2018-03-14 NOTE — ED Provider Notes (Signed)
[facility]    CSN: [id number] Arrival date & time: 03/14/18  1219     History   Chief Complaint Chief Complaint  Patient presents with  . Fall    APPT 1257    HPI [name] is a 62 y.o. female history of hypertension, hyperlipidemia, arthritis presenting today for evaluation of right shoulder/arm pain.  Patient states that she was walking her dog which is a lab earlier this morning.  The dog saw cat and began chasing after, she was using a retractable leash and this pulled her to the ground, she landed on her right shoulder/arm.  Endorsing significant pain and difficulty moving her shoulder and moving her arm above midline.  She states that most of her pain is throughout her upper arm.  Denies numbness or tingling.   Denies hitting head or loss of consciousness  HPI  Past Medical History:  Diagnosis Date  . Anxiety   . Arthritis   . Deviated septum   . Dyspnea    with exertion   wheezing at night  . Fibroid tumor   . GERD (gastroesophageal reflux disease)   . Hepatitis    AS CHILD    . Hypercholesteremia   . Hypertension   . Memory loss   . Meniscus tear   . MRSA infection   . PONV (postoperative nausea and vomiting)   . Retinal detachment   . Retinal detachment   . Spinal stenosis   . Urinary frequency     Patient Active Problem List   Diagnosis Date Noted  . Total knee replacement status 11/08/2017  . Benign paroxysmal positional vertigo of right ear 10/03/2017  . Vertigo 09/25/2017  . GERD (gastroesophageal reflux disease) 09/25/2017  . HLD (hyperlipidemia) 09/25/2017  . Obesity 09/25/2017  . Intractable vomiting with nausea   . Memory loss 05/21/2017  . Acute low back pain with sciatica 12/08/2016  . Lumbar radiculopathy 12/07/2015  . Abnormality of gait 12/07/2015  . Cervical stenosis of spinal canal 11/02/2015  . Low back pain 10/19/2015  . Spinal stenosis in cervical region 10/19/2015  . Bilateral hip pain 10/19/2015  . Cervical  spondylosis without myelopathy 08/10/2015  . Excessive daytime sleepiness 08/10/2015  . Right optic neuritis 08/10/2015  . Abnormal finding on MRI of brain 08/10/2015  . Cataract of left eye 08/03/2015  . Detached retina 08/03/2015  . Carotid artery narrowing 07/21/2015  . Abnormal glucose level 07/21/2015  . Hypertensive retinopathy 07/19/2015  . Edema, retina 07/19/2015  . Optic neuritis 07/10/2015  . Hypertension 07/10/2015  . Arthritis 07/10/2015  . Chest pain 01/21/2015  . Leg pain 01/21/2015  . Pseudoaphakia 12/22/2013  . 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 Blepharitis 12/12/2011  . Error, refractive, myopia 12/12/2011  . Posterior vitreous detachment 12/12/2011    Past Surgical History:  Procedure Laterality Date  . ANTERIOR CERVICAL DECOMP/DISCECTOMY FUSION N/A 11/02/2015   Procedure: ANTERIOR CERVICAL DECOMPRESSION FUSION CERVICAL FIVE-SIX,CERVICAL SIX-SEVEN;  Surgeon: Karie Chimera, MD;  Location: [facility];  Service: Neurosurgery;  Laterality: N/A;  right side approach  . CATARACT EXTRACTION Right   . fibroid tumor    . KNEE SURGERY Bilateral   . RETINAL DETACHMENT SURGERY Right   . TOTAL KNEE ARTHROPLASTY Right 11/08/2017  . TOTAL KNEE ARTHROPLASTY Right 11/08/2017   Procedure: RIGHT TOTAL KNEE ARTHROPLASTY;  Surgeon: Leandrew Koyanagi, MD;  Location: [facility];  Service: Orthopedics;  Laterality: Right;    OB History  None      Home Medications    Prior to Admission medications   Medication Sig Start Date End Date Taking? Authorizing Provider  aspirin EC 325 MG tablet Take 1 tablet (325 mg total) by mouth 2 (two) times daily. 11/08/17   Leandrew Koyanagi, MD  azaTHIOprine (IMURAN) 50 MG tablet Take 1 tablet (50 mg total) by mouth daily. Patient taking differently: Take 50 mg by mouth at bedtime.  05/16/16   Marcial Pacas, MD  calcium carbonate (OSCAL) 1500 (600 Ca) MG TABS tablet Take 1,500 mg by mouth daily.    [provider]  carvedilol (COREG) 25 MG tablet TAKE 1 TABLET (25 MG TOTAL) BY MOUTH 2 (TWO) TIMES DAILY WITH A MEAL. 12/24/17   Sater, Nanine Means, MD  Cholecalciferol (VITAMIN D) 2000 units CAPS Take 2,000 Units by mouth daily.    [provider]  docusate sodium (COLACE) 100 MG capsule Take 100 mg by mouth daily.     [provider]  folic acid (FOLVITE) 1 MG tablet Take 1 mg by mouth daily.  10/12/15   [provider]  gabapentin (NEURONTIN) 100 MG capsule Take 3 capsules (300 mg total) by mouth 3 (three) times daily. 10/03/17   Marcial Pacas, MD  ibuprofen (ADVIL,MOTRIN) 600 MG tablet Take 1 tablet (600 mg total) by mouth every 6 (six) hours as needed. 03/14/18   Zakiah Beckerman C, PA-C  nitroGLYCERIN (NITROSTAT) 0.4 MG SL tablet Place 0.4 mg under the tongue every 5 (five) minutes as needed for chest pain.  01/21/15   [provider]  omeprazole (PRILOSEC) 20 MG capsule Take 20 mg by mouth daily.  02/17/16   [provider]  OVER THE COUNTER MEDICATION Take 1 tablet by mouth daily. [employer]    [provider]  pravastatin (PRAVACHOL) 20 MG tablet Take 20 mg by mouth every evening. 06/04/17   [provider]  promethazine (PHENERGAN) 25 MG tablet Take 1 tablet (25 mg total) by mouth every 6 (six) hours as needed for nausea. 11/08/17   Leandrew Koyanagi, MD  senna-docusate (SENOKOT S) 8.6-50 MG tablet Take 1 tablet by mouth at bedtime as needed. 11/08/17   Leandrew Koyanagi, MD  tiZANidine (ZANAFLEX) 4 MG tablet Take 1 tablet (4 mg total) by mouth every 6 (six) hours as needed for muscle spasms. 11/08/17   Leandrew Koyanagi, MD  tiZANidine (ZANAFLEX) 4 MG tablet Take 1 tablet (4 mg total) by mouth every 6 (six) hours as needed for muscle spasms. 02/01/18   Leandrew Koyanagi, MD    Family History Family History  Problem Relation Age of Onset  . Stroke Mother   . Arrhythmia Mother   . Bladder Cancer Mother   . Thyroid disease Mother   . Heart disease Father     . Heart attack Father   . Stroke Maternal Grandfather   . Stroke Maternal Grandmother     Social History Social History   Tobacco Use  . Smoking status: Never Smoker  . Smokeless tobacco: Never Used  Substance Use Topics  . Alcohol use: No  . Drug use: No     Allergies   Patient has no known allergies.   Review of Systems Review of Systems  Constitutional: Negative for activity change and appetite change.  Eyes: Negative for pain and visual disturbance.  Respiratory: Negative for shortness of breath.   Cardiovascular: Negative for chest pain.  Gastrointestinal: Negative for abdominal pain, nausea and vomiting.  Musculoskeletal: Positive for arthralgias and myalgias. Negative for back pain, gait problem, neck pain and neck stiffness.  Skin: Negative for color change, rash and wound.  Neurological: Negative for dizziness, seizures, syncope, weakness, light-headedness, numbness and headaches.     Physical Exam Triage Vital Signs ED Triage Vitals  Enc Vitals Group     BP 03/14/18 1238 (!) 155/78     Pulse Rate 03/14/18 1238 66     Resp 03/14/18 1238 17     Temp 03/14/18 1238 98 F (36.7 C)     Temp Source 03/14/18 1238 Oral     SpO2 03/14/18 1238 98 %     Weight --      Height --      Head Circumference --      Peak Flow --      Pain Score 03/14/18 1234 8     Pain Loc --      Pain Edu? --      Excl. in [place]? --    No data found.  Updated Vital Signs BP (!) 155/78 (BP Location: Left Arm)   Pulse 66   Temp 98 F (36.7 C) (Oral)   Resp 17   SpO2 98%   Visual Acuity Right Eye Distance:   Left Eye Distance:   Bilateral Distance:    Right Eye Near:   Left Eye Near:    Bilateral Near:     Physical Exam  Constitutional: She appears well-developed and well-nourished. No distress.  HENT:  Head: Normocephalic and atraumatic.  Eyes: Conjunctivae are normal.  Neck: Neck supple.  Cardiovascular: Normal rate and regular rhythm.  No murmur  heard. Pulmonary/Chest: Effort normal and breath sounds normal. No respiratory distress.  Abdominal: Soft. There is no tenderness.  Musculoskeletal: She exhibits no edema.  Patient holding right arm flexed at elbow and against chest, patient does have full extension at elbow, limiting abduction and forward flexion.  Tenderness to palpation diffusely throughout humeral area.  Nontender to palpation over elbow.  Nontender to palpation of her clavicle or AC joint.  Neurological: She is alert.  Skin: Skin is warm and dry.  Psychiatric: She has a normal mood and affect.  Nursing note and vitals reviewed.    UC Treatments / Results  Labs (all labs ordered are listed, but only abnormal results are displayed) Labs Reviewed - No data to display  EKG None  Radiology Dg Shoulder Right  Result Date: 03/14/2018 CLINICAL DATA:  Right shoulder pain since a fall walking a dog this morning. Initial encounter. EXAM: RIGHT SHOULDER - 2+ VIEW COMPARISON:  None. FINDINGS: There is no acute bony or joint abnormality. Mild glenohumeral and moderate acromioclavicular osteoarthritis is seen. Soft tissues are unremarkable. IMPRESSION: No acute finding. Mild glenohumeral and moderate acromioclavicular osteoarthritis. Electronically Signed   By: Inge Rise M.D.   On: 03/14/2018 13:21   Dg Humerus Right  Result Date: 03/14/2018 CLINICAL DATA:  Per pt: early this morning walking dog, dog bolted and lunged and was pulled down to the ground. Injury is to the right humerus, distal to the humeral head, posteriorly and medially. EXAM: RIGHT HUMERUS - 2+ VIEW COMPARISON:  None. FINDINGS: There is no evidence of fracture or other focal bone lesions. Soft tissues are unremarkable. IMPRESSION: Negative. Electronically Signed   By: Nolon Nations M.D.   On: 03/14/2018 13:11    Procedures Procedures (including critical care time)  Medications Ordered in UC Medications - No data to display  Initial Impression /  Assessment and Plan / UC Course  I have reviewed the triage vital signs and the nursing notes.  Pertinent labs & imaging results that were available during my care of the patient were reviewed by me and considered in my medical decision making (see chart for details).     X-rays negative for acute bony abnormality related to the fall.  Symptoms still concerning for rotator cuff tendinopathy versus tear.  This time will provide comfort sling, advised to take anti-inflammatories, work on shoulder range of motion exercises.  Ice.  Follow-up with orthopedics.Discussed strict return precautions. Patient verbalized understanding and is agreeable with plan.  Final Clinical Impressions(s) / UC Diagnoses   Final diagnoses:  Acute pain of right shoulder     Discharge Instructions     Please use sling for comfort, please slowly work on increasing the range of motion of your shoulder, please reference exercises provided in the back.  Use anti-inflammatories for pain/swelling. You may take up to 800 mg Ibuprofen every 8 hours with food. You may supplement Ibuprofen with Tylenol [phone] mg every 8 hours.   Please follow-up with orthopedics for further evaluation of possible rotator cuff injury.    ED Prescriptions    Medication Sig Dispense Auth. Provider   ibuprofen (ADVIL,MOTRIN) 600 MG tablet Take 1 tablet (600 mg total) by mouth every 6 (six) hours as needed. 30 tablet Aiyonna Lucado, [place] C, PA-C     Controlled Substance Prescriptions [place] Controlled Substance Registry consulted? Not Applicable   Janith Lima, [place] 03/14/18 1334

## 2018-05-09 ENCOUNTER — Ambulatory Visit (INDEPENDENT_AMBULATORY_CARE_PROVIDER_SITE_OTHER): Payer: Medicare Other | Admitting: Orthopaedic Surgery

## 2018-05-09 ENCOUNTER — Encounter (INDEPENDENT_AMBULATORY_CARE_PROVIDER_SITE_OTHER): Payer: Self-pay | Admitting: Orthopaedic Surgery

## 2018-05-09 ENCOUNTER — Ambulatory Visit (INDEPENDENT_AMBULATORY_CARE_PROVIDER_SITE_OTHER): Payer: Medicare Other

## 2018-05-09 DIAGNOSIS — Z96651 Presence of right artificial knee joint: Secondary | ICD-10-CM | POA: Diagnosis not present

## 2018-05-09 DIAGNOSIS — M25511 Pain in right shoulder: Secondary | ICD-10-CM | POA: Diagnosis not present

## 2018-05-09 DIAGNOSIS — G8929 Other chronic pain: Secondary | ICD-10-CM

## 2018-05-09 MED ORDER — BUPIVACAINE HCL 0.25 % IJ SOLN
2.0000 mL | INTRAMUSCULAR | Status: AC | PRN
  Administered 2018-05-09: 2 mL via INTRA_ARTICULAR

## 2018-05-09 MED ORDER — LIDOCAINE HCL 2 % IJ SOLN
2.0000 mL | INTRAMUSCULAR | Status: AC | PRN
  Administered 2018-05-09: 2 mL

## 2018-05-09 MED ORDER — METHYLPREDNISOLONE ACETATE 40 MG/ML IJ SUSP
40.0000 mg | INTRAMUSCULAR | Status: AC | PRN
  Administered 2018-05-09: 40 mg via INTRA_ARTICULAR

## 2018-05-09 NOTE — Progress Notes (Signed)
Office Visit Note   Patient: [name]           Date of Birth: 1955/12/06           MRN: [id number] Visit Date: 05/09/2018              Requested by: Crist Infante, MD [address] [place], [place] [address] PCP: Crist Infante, MD   Assessment & Plan: Visit Diagnoses:  1. History of total right knee replacement   2. Chronic right shoulder pain     Plan: Impression is #1 status post right total knee replacement doing well.  #2 right shoulder subacromial bursitis and rotator cuff tendinitis versus less likely rotator cuff tear.  Today, we will inject the right subacromial space with cortisone.  She will take it easy over the next few days.  She is not any better in the next 2 weeks she will call and let us know will obtain an MRI to assess her rotator cuff.  Otherwise, follow-up with [place] in 6 months time for recheck of her total knee replacement.  Follow-Up Instructions: Return in about 6 months (around 11/09/2018).   Orders:  Orders Placed This Encounter  Procedures  . Large Joint Inj: R subacromial bursa  . XR Knee 1-2 Views Right   No orders of the defined types were placed in this encounter.     Procedures: Large Joint Inj: R subacromial bursa on 05/09/2018 9:00 AM Indications: pain Details: 22 G needle Medications: 2 mL lidocaine 2 %; 2 mL bupivacaine 0.25 %; 40 mg methylPREDNISolone acetate 40 MG/ML Outcome: tolerated well, no immediate complications Patient was prepped and draped in the usual sterile fashion.       Clinical Data: No additional findings.   Subjective: Chief Complaint  Patient presents with  . Right Knee - Pain  . Right Shoulder - Pain    HPI patient is a pleasant 62 year old female who presents to our clinic today 6 months status post right total knee replacement, date of surgery 11/08/2017.  Doing excellent regards to the knee.  Her other issue today is her right shoulder.  On 03/14/2018, she was walking around a large dog when the dog  pulled causing her to fall onto her right shoulder.  She was seen at [facility] urgent care where x-rays were obtained.  No acute fracture to the shoulder or humerus.  She has had continued pain since.  She comes in today for further evaluation treatment recommendation.  All her pain is to the deltoid.  Worse with forward flexion and abduction.  She has significant pain trying to sleep on the affected side as well.  She has tried Tylenol without relief of symptoms.  No numbness, tingling or burning.  No previous shoulder pathology.  Review of Systems as detailed in HPI.  All others reviewed and are negative.   Objective: Vital Signs: There were no vitals taken for this visit.  Physical Exam well-developed well-nourished female no acute distress.  Alert and oriented x3.  Ortho Exam examination of her right knee reveals well-healed surgical incision without evidence of infection.  Range of motion 0 to 125 degrees.  Stable valgus varus stress.  Neurovascular intact distally.  Examination of the right shoulder reveals near full active range of motion.  She can internally rotate to T12.  Positive empty can and cross body adduction.  She is neurovascularly intact distally.  Specialty Comments:  No specialty comments available.  Imaging: Xr Knee 1-2 Views Right  Result Date: 05/09/2018 Well-seated prosthesis    PMFS History: Patient Active Problem List   Diagnosis Date Noted  . Chronic right shoulder pain 05/09/2018  . History of total right knee replacement 11/08/2017  . Benign paroxysmal positional vertigo of right ear 10/03/2017  . Vertigo 09/25/2017  . GERD (gastroesophageal reflux disease) 09/25/2017  . HLD (hyperlipidemia) 09/25/2017  . Obesity 09/25/2017  . Intractable vomiting with nausea   . Memory loss 05/21/2017  . Acute low back pain with sciatica 12/08/2016  . Lumbar radiculopathy 12/07/2015  . Abnormality of gait 12/07/2015  . Cervical stenosis of spinal canal 11/02/2015    . Low back pain 10/19/2015  . Spinal stenosis in cervical region 10/19/2015  . Bilateral hip pain 10/19/2015  . Cervical spondylosis without myelopathy 08/10/2015  . Excessive daytime sleepiness 08/10/2015  . Right optic neuritis 08/10/2015  . Abnormal finding on MRI of brain 08/10/2015  . Cataract of left eye 08/03/2015  . Detached retina 08/03/2015  . Carotid artery narrowing 07/21/2015  . Abnormal glucose level 07/21/2015  . Hypertensive retinopathy 07/19/2015  . Edema, retina 07/19/2015  . Optic neuritis 07/10/2015  . Hypertension 07/10/2015  . Arthritis 07/10/2015  . Chest pain 01/21/2015  . Leg pain 01/21/2015  . Pseudoaphakia 12/22/2013  . Congenital anomaly of optic nerve ([place]) 12/12/2011  . 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 Blepharitis 12/12/2011  . Error, refractive, myopia 12/12/2011  . Posterior vitreous detachment 12/12/2011   Past Medical History:  Diagnosis Date  . Anxiety   . Arthritis   . Deviated septum   . Dyspnea    with exertion   wheezing at night  . Fibroid tumor   . GERD (gastroesophageal reflux disease)   . Hepatitis    AS CHILD    . Hypercholesteremia   . Hypertension   . Memory loss   . Meniscus tear   . MRSA infection   . PONV (postoperative nausea and vomiting)   . Retinal detachment   . Retinal detachment   . Spinal stenosis   . Urinary frequency     Family History  Problem Relation Age of Onset  . Stroke Mother   . Arrhythmia Mother   . Bladder Cancer Mother   . Thyroid disease Mother   . Heart disease Father   . Heart attack Father   . Stroke Maternal Grandfather   . Stroke Maternal Grandmother     Past Surgical History:  Procedure Laterality Date  . ANTERIOR CERVICAL DECOMP/DISCECTOMY FUSION N/A 11/02/2015   Procedure: ANTERIOR CERVICAL DECOMPRESSION FUSION CERVICAL FIVE-SIX,CERVICAL SIX-SEVEN;  Surgeon: Karie Chimera, MD;  Location: [place] [facility];  Service: Neurosurgery;  Laterality: N/A;  right side approach  . CATARACT  EXTRACTION Right   . fibroid tumor    . KNEE SURGERY Bilateral   . RETINAL DETACHMENT SURGERY Right   . TOTAL KNEE ARTHROPLASTY Right 11/08/2017  . TOTAL KNEE ARTHROPLASTY Right 11/08/2017   Procedure: RIGHT TOTAL KNEE ARTHROPLASTY;  Surgeon: Leandrew Koyanagi, MD;  Location: [place] [facility];  Service: Orthopedics;  Laterality: Right;   Social History   Occupational History  . Occupation: caregiver    Comment: Part-time  Tobacco Use  . Smoking status: Never Smoker  . Smokeless tobacco: Never Used  Substance and Sexual Activity  . Alcohol use: No  . Drug use: No  . Sexual activity: Not on file

## 2018-05-22 ENCOUNTER — Other Ambulatory Visit: Payer: Self-pay | Admitting: Internal Medicine

## 2018-05-22 DIAGNOSIS — Z1231 Encounter for screening mammogram for malignant neoplasm of breast: Secondary | ICD-10-CM

## 2018-07-09 ENCOUNTER — Ambulatory Visit
Admission: RE | Admit: 2018-07-09 | Discharge: 2018-07-09 | Disposition: A | Discharge status: Home / Self Care | Payer: Medicare Other | Source: Ambulatory Visit | Attending: Internal Medicine | Admitting: Internal Medicine

## 2018-07-09 ENCOUNTER — Ambulatory Visit: Payer: Medicare Other

## 2018-07-09 DIAGNOSIS — Z1231 Encounter for screening mammogram for malignant neoplasm of breast: Secondary | ICD-10-CM

## 2018-07-31 DIAGNOSIS — Q079 Congenital malformation of nervous system, unspecified: Secondary | ICD-10-CM | POA: Insufficient documentation

## 2018-08-01 ENCOUNTER — Telehealth (HOSPITAL_COMMUNITY): Payer: Self-pay | Admitting: Surgery

## 2018-08-01 NOTE — Telephone Encounter (Signed)
Received an order from [facility], Dr. Crist Infante requesting a Carotid duplex for bil carotid stenosis.  A voicemail was left at [phone] asking the patient to call Rip Harbour at [facility] to arrange this request.

## 2018-08-05 ENCOUNTER — Telehealth (HOSPITAL_COMMUNITY): Payer: Self-pay | Admitting: Surgery

## 2018-08-05 NOTE — Telephone Encounter (Signed)
Mrs. Hannis returned the call to schedule her Carotid u/s ordered by Dr. Joylene Draft.   She requested an appointment the afternoon of 11/14.  I offered her the 4pm appointment - she stated this was too late.  She voiced concerns of having to pay $100 copay for the MRI- I advised this was not an MRI but an ultrasound.   She ask if I would call her insurance to find out her responsibility.  I advised her she would need to call the insurance.  Explained that her portion would be billed after it went through the insurance.   She said that she was going to hold off on scheduling right now.   She would call us later to schedule an appointment. [place]

## 2018-10-29 ENCOUNTER — Ambulatory Visit (INDEPENDENT_AMBULATORY_CARE_PROVIDER_SITE_OTHER): Payer: Medicare Other

## 2018-10-29 ENCOUNTER — Other Ambulatory Visit: Payer: Self-pay

## 2018-10-29 ENCOUNTER — Ambulatory Visit: Payer: Medicare Other | Admitting: Physician Assistant

## 2018-10-29 ENCOUNTER — Encounter: Payer: Self-pay | Admitting: Physician Assistant

## 2018-10-29 VITALS — BP 142/88 | HR 73 | Temp 99.0°F | Resp 18 | Ht 69.0 in | Wt 205.2 lb

## 2018-10-29 DIAGNOSIS — M546 Pain in thoracic spine: Secondary | ICD-10-CM | POA: Diagnosis not present

## 2018-10-29 DIAGNOSIS — R030 Elevated blood-pressure reading, without diagnosis of hypertension: Secondary | ICD-10-CM

## 2018-10-29 DIAGNOSIS — M545 Low back pain, unspecified: Secondary | ICD-10-CM

## 2018-10-29 MED ORDER — MELOXICAM 7.5 MG PO TABS: 7.5000 mg | ORAL_TABLET | Freq: Every day | ORAL | 1 refills | Status: DC

## 2018-10-29 NOTE — Patient Instructions (Addendum)
Meloxiam is an NSAID. Do not use with any other otc pain medication other than tylenol/acetaminophen - so no aleve, ibuprofen, motrin, advil, etc. Take 7.5-15 mg 1-2x/day (15mg /day max daily).  Apply moist heat to the area. Wet a towel and wring it out so it is damp. Put it in the microwave for about 15-20 seconds - long enough to make it hot, but not too hot to apply to your skin causing burns. Do this for about 20-30 minutes, 3-4 times a day.   Perform gentle, light stretches 2-3 times a day.   Put a tennis ball between your back and a wall. Gentle massage the area with rolling the ball around the affected area. Try a foam roller.   Stay well hydrated - try to drink 32-64 oz/day.   If you feel like you need a new pillow, try "My Pillow".  Let us know if you'd like a referral to PT Follow-up with your PCP  Trigger Point/Dry Needling   What is Trigger Point Dry Needling (DN)?   1. DN is a physical therapy technique used to treat muscle pain and Dysfunction.  Specifically, DN helps deactivate muscle trigger points (Muscle Knots).   2. A thin filiform needle is used to penetrate the skin and stimulate the underlying trigger point.  The goal is for a local twitch response (LTR) to occur and for the trigger point to relax.  No medication of any kind is injected during the procedure.   What Does Trigger Point Dry Needling Feel Like?   1. The procedures feels different for each individual patient.   Some patients report that they do not actually feel the needle enter the skin and overall the process is not painful.  Very mild bleeding may occur.  However, many patients feel a deep cramping in the muscle in which the needle was inserted. This is the local twitch response.    How Will I Feel After The Treatment?   1. Soreness is normal, and the onset of soreness may not occur for a few hours.  Typically this soreness does not last longer than two days.   2. Bruising is uncommon, however; ice  can be used to decrease any possible bruising.   3. In rare cases feeling tired or nauseous after the treatment is normal.  In addition, your symptoms may get worse before they get better, this period will typically not last longer than 24 hours.   What Can I do After My Treatment?   1.  Increase your hydration by drinking more water for the next 24 hours.   2.  You may place ice or heat on the areas treated that have become sore, however don not use heat on inflamed or bruised areas.  Heat often brings more relief post needling.   3. You can continue your regular activities, but vigorous activity is not recommended initially after the treatment for 24 hours.   4. DN is best combined with other physical therapy such as strengthening, stretching, and other therapies.    IF you received an x-ray today, you will receive an invoice from [facility]. Please contact [facility] at [phone] with questions or concerns regarding your invoice.   IF you received labwork today, you will receive an invoice from [facility]. Please contact [facility] at [phone] with questions or concerns regarding your invoice.   Our billing staff will not be able to assist you with questions regarding bills from these companies.  You will be contacted with  the lab results as soon as they are available. The fastest way to get your results is to activate your My Chart account. Instructions are located on the last page of this paperwork. If you have not heard from [place] regarding the results in 2 weeks, please contact this office.

## 2018-10-29 NOTE — Progress Notes (Signed)
[name]  MRN: [id number] DOB: 07/01/1956  PCP: Crist Infante, MD  Subjective:  Pt is a 63 year old female who presents to clinic for back pain x 4 days.  Pain is located in the middle and upper back and shoulders.  4 days ago pain was "110 out of 10".   No known MOI.  She has been applying otc cream to the area.  Denies urinary symptoms, n/v, cough, chest pain, abdominal pain.   Pt  has a past medical history of Anxiety, Arthritis, Deviated septum, Dyspnea, Fibroid tumor, GERD (gastroesophageal reflux disease), Hepatitis, Hypercholesteremia, Hypertension, Memory loss, Meniscus tear, MRSA infection, PONV (postoperative nausea and vomiting), Retinal detachment, Retinal detachment, Spinal stenosis, and Urinary frequency.  Review of Systems  Musculoskeletal: Positive for back pain.    Patient Active Problem List   Diagnosis Date Noted  . Chronic right shoulder pain 05/09/2018  . History of total right knee replacement 11/08/2017  . Benign paroxysmal positional vertigo of right ear 10/03/2017  . Vertigo 09/25/2017  . GERD (gastroesophageal reflux disease) 09/25/2017  . HLD (hyperlipidemia) 09/25/2017  . Obesity 09/25/2017  . Intractable vomiting with nausea   . Memory loss 05/21/2017  . Acute low back pain with sciatica 12/08/2016  . Lumbar radiculopathy 12/07/2015  . Abnormality of gait 12/07/2015  . Cervical stenosis of spinal canal 11/02/2015  . Low back pain 10/19/2015  . Spinal stenosis in cervical region 10/19/2015  . Bilateral hip pain 10/19/2015  . Cervical spondylosis without myelopathy 08/10/2015  . Excessive daytime sleepiness 08/10/2015  . Right optic neuritis 08/10/2015  . Abnormal finding on MRI of brain 08/10/2015  . Cataract of left eye 08/03/2015  . Detached retina 08/03/2015  . Carotid artery narrowing 07/21/2015  . Abnormal glucose level 07/21/2015  . Hypertensive retinopathy 07/19/2015  . Edema, retina 07/19/2015  . Optic neuritis 07/10/2015  .  Hypertension 07/10/2015  . Arthritis 07/10/2015  . Chest pain 01/21/2015  . Leg pain 01/21/2015  . Pseudoaphakia 12/22/2013  . Congenital anomaly of optic nerve ([place]) 12/12/2011  . 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 Blepharitis 12/12/2011  . Error, refractive, myopia 12/12/2011  . Posterior vitreous detachment 12/12/2011    Current Outpatient Medications on File Prior to Visit  Medication Sig Dispense Refill  . aspirin EC 325 MG tablet Take 1 tablet (325 mg total) by mouth 2 (two) times daily. 84 tablet 0  . azaTHIOprine (IMURAN) 50 MG tablet Take 1 tablet (50 mg total) by mouth daily. (Patient taking differently: Take 50 mg by mouth at bedtime. ) 90 tablet 4  . calcium carbonate (OSCAL) 1500 (600 Ca) MG TABS tablet Take 1,500 mg by mouth daily.    . carvedilol (COREG) 25 MG tablet TAKE 1 TABLET (25 MG TOTAL) BY MOUTH 2 (TWO) TIMES DAILY WITH A MEAL. 60 tablet 11  . Cholecalciferol (VITAMIN D) 2000 units CAPS Take 2,000 Units by mouth daily.    Marland Kitchen docusate sodium (COLACE) 100 MG capsule Take 100 mg by mouth daily.     . folic acid (FOLVITE) 1 MG tablet Take 1 mg by mouth daily.     Marland Kitchen gabapentin (NEURONTIN) 100 MG capsule Take 3 capsules (300 mg total) by mouth 3 (three) times daily. 270 capsule 11  . ibuprofen (ADVIL,MOTRIN) 600 MG tablet Take 1 tablet (600 mg total) by mouth every 6 (six) hours as needed. 30 tablet 0  . nitroGLYCERIN (NITROSTAT) 0.4 MG SL tablet Place 0.4 mg under the tongue every 5 (five) minutes as needed  for chest pain.     Marland Kitchen omeprazole (PRILOSEC) 20 MG capsule Take 20 mg by mouth daily.   8  . OVER THE COUNTER MEDICATION Take 1 tablet by mouth daily. Vision Gold    . pravastatin (PRAVACHOL) 20 MG tablet Take 20 mg by mouth every evening.  6  . promethazine (PHENERGAN) 25 MG tablet Take 1 tablet (25 mg total) by mouth every 6 (six) hours as needed for nausea. 30 tablet 1  . senna-docusate (SENOKOT S) 8.6-50 MG tablet Take 1 tablet by mouth at bedtime as needed. 30 tablet 1    . tiZANidine (ZANAFLEX) 4 MG tablet Take 1 tablet (4 mg total) by mouth every 6 (six) hours as needed for muscle spasms. 30 tablet 2  . tiZANidine (ZANAFLEX) 4 MG tablet Take 1 tablet (4 mg total) by mouth every 6 (six) hours as needed for muscle spasms. 30 tablet 2  . valsartan (DIOVAN) 320 MG tablet TAKE ONE EVERY EVENING AT BEDTIME     No current facility-administered medications on file prior to visit.     No Known Allergies   Objective:  BP (!) 167/90   Pulse 73   Temp 99 F (37.2 C) (Oral)   Resp 18   Ht 5\' 9"  (1.753 m)   Wt 205 lb 3.2 oz (93.1 kg)   SpO2 97%   BMI 30.30 kg/m   Physical Exam Vitals signs reviewed.  Constitutional:      Appearance: Normal appearance.  Musculoskeletal:     Thoracic back: She exhibits tenderness. She exhibits normal range of motion and no bony tenderness.       Back:  Neurological:     Mental Status: She is alert.    Dg Chest 2 View  Result Date: 10/29/2018 CLINICAL DATA:  acute right-sided back pain. No MOI. no cough or fever. EXAM: CHEST - 2 VIEW COMPARISON:  10/29/2017 FINDINGS: Linear scarring or subsegmental atelectasis at both lung bases, new since previous. No confluent airspace disease. Heart size and mediastinal contours are within normal limits. No effusion. Cervical fixation hardware partially visualized. IMPRESSION: Bibasilar scarring or atelectasis, new since previous Electronically Signed   By: Lucrezia Europe M.D.   On: 10/29/2018 12:27    Assessment and Plan :  1. Bilateral thoracic back pain, unspecified chronicity - pt presents c/o con't back pain x several days. No red flags. Chest x-ray is negative. Pain is reproducible, suspect MSK pain. Plan to treat as such. RTC in 1-2 weeks if no improvement.  - DG Chest 2 View; Future - meloxicam (MOBIC) 7.5 MG tablet; Take 1-2 tablets (7.5-15 mg total) by mouth daily.  Dispense: 30 tablet; Refill: 1  2. Elevated blood pressure reading - Recheck vitals   Mercer Pod, PA-C   [facility] 10/29/2018 11:53 AM  Please note: Portions of this report may have been transcribed using dragon voice recognition software. Every effort was made to ensure accuracy; however, inadvertent computerized transcription errors may be present.

## 2018-11-12 ENCOUNTER — Ambulatory Visit (INDEPENDENT_AMBULATORY_CARE_PROVIDER_SITE_OTHER): Payer: Medicare Other

## 2018-11-12 ENCOUNTER — Encounter (INDEPENDENT_AMBULATORY_CARE_PROVIDER_SITE_OTHER): Payer: Self-pay | Admitting: Orthopaedic Surgery

## 2018-11-12 ENCOUNTER — Ambulatory Visit (INDEPENDENT_AMBULATORY_CARE_PROVIDER_SITE_OTHER): Payer: Medicare Other | Admitting: Orthopaedic Surgery

## 2018-11-12 DIAGNOSIS — M25561 Pain in right knee: Secondary | ICD-10-CM

## 2018-11-12 DIAGNOSIS — G8929 Other chronic pain: Secondary | ICD-10-CM

## 2018-11-12 DIAGNOSIS — Z96651 Presence of right artificial knee joint: Secondary | ICD-10-CM

## 2018-11-12 NOTE — Progress Notes (Addendum)
Office Visit Note   Patient: [name]           Date of Birth: 01-05-1956           MRN: [id number] Visit Date: 11/12/2018              Requested by: Crist Infante, MD [address] [place], [place] [address] PCP: Crist Infante, MD   Assessment & Plan: Visit Diagnoses:  1. Status post right knee replacement   2. Chronic pain of right knee     Plan: 1 year TKA follow up plan  Patient is now one year out from a right total knee arthroplasty. Patient is doing very well and very pleased with the results. Radiographs reveal a total knee arthroplasty in good position, with no evidence of subsidence, loosening, or complicating features. It was reinforced that prophylactic antibiotics should be taken with any procedure including but not limited to dental work or colonoscopies. We plan to follow them at 1 year intervals at this time with radiographs at each visit, and as always we should be notified with any questions or concerns in the interim.  Follow-Up Instructions: Return in about 1 year (around 11/13/2019).   Orders:  Orders Placed This Encounter  Procedures  . XR KNEE 3 VIEW RIGHT   No orders of the defined types were placed in this encounter.     Procedures: No procedures performed   Clinical Data: No additional findings.   Subjective: Chief Complaint  Patient presents with  . Right Shoulder - Follow-up  . Right Knee - Pain    [name] is 1 year status post right total knee replacement.  She is doing well.  She reports no problems with the right knee.   Review of Systems   Objective: Vital Signs: There were no vitals taken for this visit.  Physical Exam  Ortho Exam Right knee shows a fully healed surgical scar.  Excellent range of motion.  Stable to varus valgus.  No joint effusion. Specialty Comments:  No specialty comments available.  Imaging: Xr Knee 3 View Right  Result Date: 11/12/2018 Stable right total knee replacement without  complication    PMFS History: Patient Active Problem List   Diagnosis Date Noted  . Chronic right shoulder pain 05/09/2018  . Status post right knee replacement 11/08/2017  . Benign paroxysmal positional vertigo of right ear 10/03/2017  . Vertigo 09/25/2017  . GERD (gastroesophageal reflux disease) 09/25/2017  . HLD (hyperlipidemia) 09/25/2017  . Obesity 09/25/2017  . Intractable vomiting with nausea   . Memory loss 05/21/2017  . Acute low back pain with sciatica 12/08/2016  . Lumbar radiculopathy 12/07/2015  . Abnormality of gait 12/07/2015  . Cervical stenosis of spinal canal 11/02/2015  . Low back pain 10/19/2015  . Spinal stenosis in cervical region 10/19/2015  . Bilateral hip pain 10/19/2015  . Cervical spondylosis without myelopathy 08/10/2015  . Excessive daytime sleepiness 08/10/2015  . Right optic neuritis 08/10/2015  . Abnormal finding on MRI of brain 08/10/2015  . Cataract of left eye 08/03/2015  . Detached retina 08/03/2015  . Carotid artery narrowing 07/21/2015  . Abnormal glucose level 07/21/2015  . Hypertensive retinopathy 07/19/2015  . Edema, retina 07/19/2015  . Optic neuritis 07/10/2015  . Hypertension 07/10/2015  . Arthritis 07/10/2015  . Chest pain 01/21/2015  . Leg pain 01/21/2015  . Pseudoaphakia 12/22/2013  . 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 Blepharitis 12/12/2011  . Error, refractive, myopia  12/12/2011  . Posterior vitreous detachment 12/12/2011   Past Medical History:  Diagnosis Date  . Anxiety   . Arthritis   . Deviated septum   . Dyspnea    with exertion   wheezing at night  . Fibroid tumor   . GERD (gastroesophageal reflux disease)   . Hepatitis    AS CHILD    . Hypercholesteremia   . Hypertension   . Memory loss   . Meniscus tear   . MRSA infection   . PONV (postoperative nausea and vomiting)   . Retinal detachment   . Retinal detachment   . Spinal stenosis   . Urinary  frequency     Family History  Problem Relation Age of Onset  . Stroke Mother   . Arrhythmia Mother   . Bladder Cancer Mother   . Thyroid disease Mother   . Heart disease Father   . Heart attack Father   . Stroke Maternal Grandfather   . Stroke Maternal Grandmother     Past Surgical History:  Procedure Laterality Date  . ANTERIOR CERVICAL DECOMP/DISCECTOMY FUSION N/A 11/02/2015   Procedure: ANTERIOR CERVICAL DECOMPRESSION FUSION CERVICAL FIVE-SIX,CERVICAL SIX-SEVEN;  Surgeon: Karie Chimera, MD;  Location: [facility];  Service: Neurosurgery;  Laterality: N/A;  right side approach  . CATARACT EXTRACTION Right   . fibroid tumor    . KNEE SURGERY Bilateral   . RETINAL DETACHMENT SURGERY Right   . TOTAL KNEE ARTHROPLASTY Right 11/08/2017  . TOTAL KNEE ARTHROPLASTY Right 11/08/2017   Procedure: RIGHT TOTAL KNEE ARTHROPLASTY;  Surgeon: Leandrew Koyanagi, MD;  Location: [facility];  Service: Orthopedics;  Laterality: Right;   Social History   Occupational History  . Occupation: caregiver    Comment: Part-time  Tobacco Use  . Smoking status: Never Smoker  . Smokeless tobacco: Never Used  Substance and Sexual Activity  . Alcohol use: No  . Drug use: No  . Sexual activity: Not on file

## 2018-12-30 ENCOUNTER — Other Ambulatory Visit: Payer: Self-pay | Admitting: Physician Assistant

## 2018-12-30 DIAGNOSIS — M546 Pain in thoracic spine: Secondary | ICD-10-CM

## 2019-01-22 ENCOUNTER — Other Ambulatory Visit: Payer: Self-pay | Admitting: Neurology

## 2019-01-30 IMAGING — MR MR MRA HEAD W/O CM
9 of 11 series · 33 of 48 positions shown · non-contrast
Comparison: CT head 09/25/2017

CLINICAL DATA: TIA

EXAM:
MRI HEAD WITHOUT CONTRAST
MRA HEAD WITHOUT CONTRAST
TECHNIQUE: Multiplanar, multiecho pulse sequences of the brain and surrounding
structures were obtained without intravenous contrast. Angiographic
images of the head were obtained using MRA technique without
contrast.

[Series 3: DWI · axial · 3.0mm · 1.09mm/px · z∈[-37,+108]mm · 9 of 102 slices shown (1 of 4)]
[im 1/102]
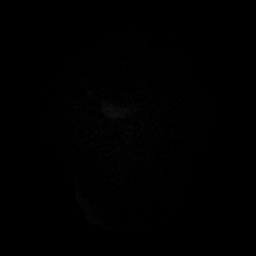
[im 13/102]
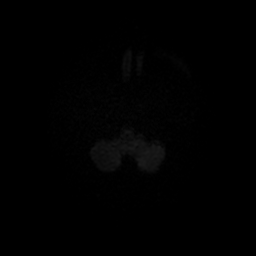
[im 26/102]
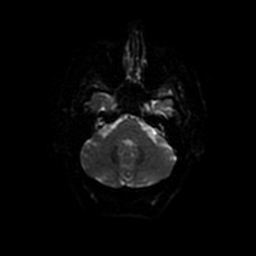
[im 38/102]
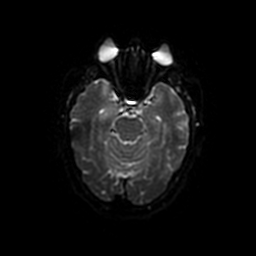
[im 51/102]
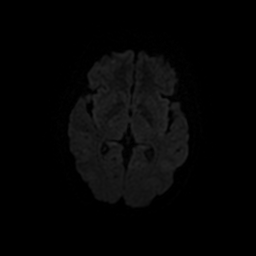
[im 64/102]
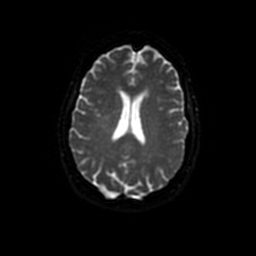
[im 76/102]
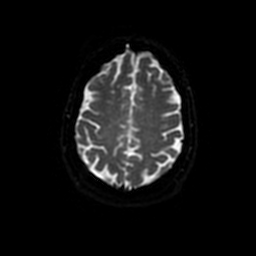
[im 89/102]
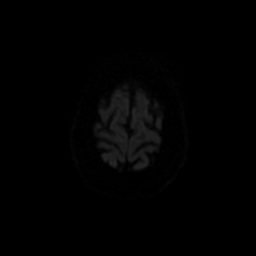
[im 102/102]
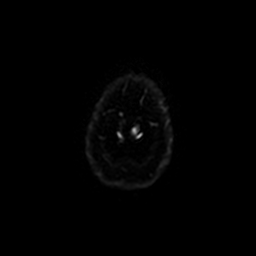

[Series 4: (id) mt fs · axial · 1.4mm · 0.39mm/px · z∈[-35,+14]mm · 4 of 151 slices shown]
[im 1/151]
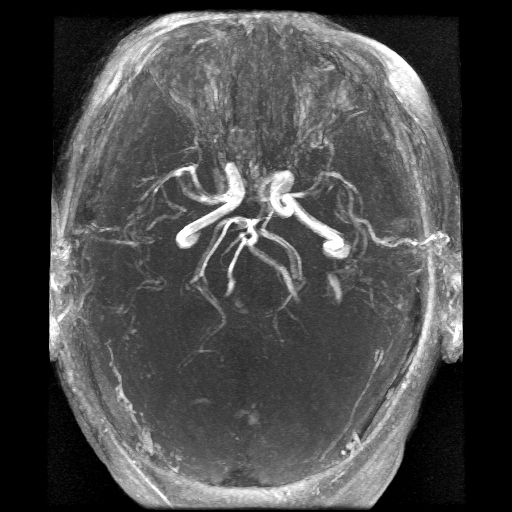
[im 28/151]
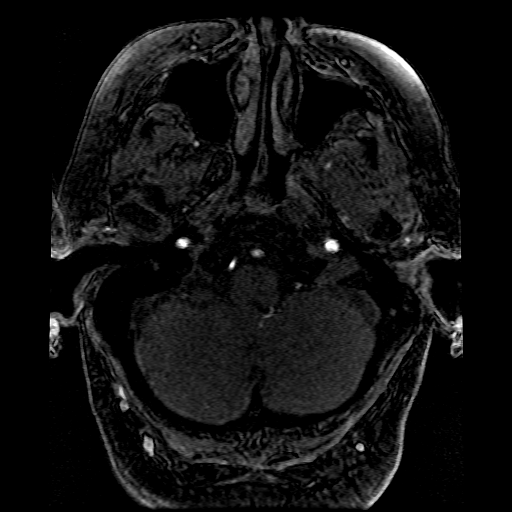
[im 41/151]
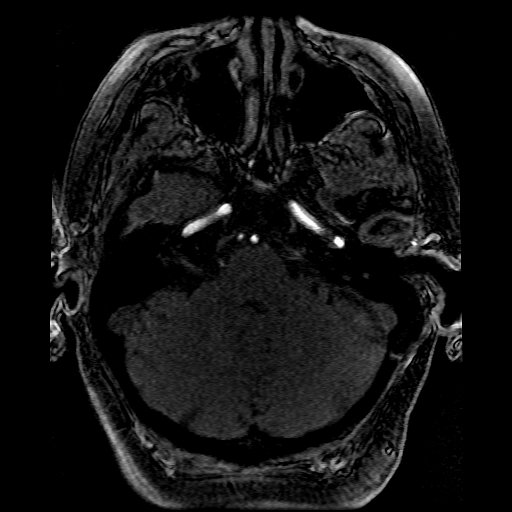
[im 69/151]
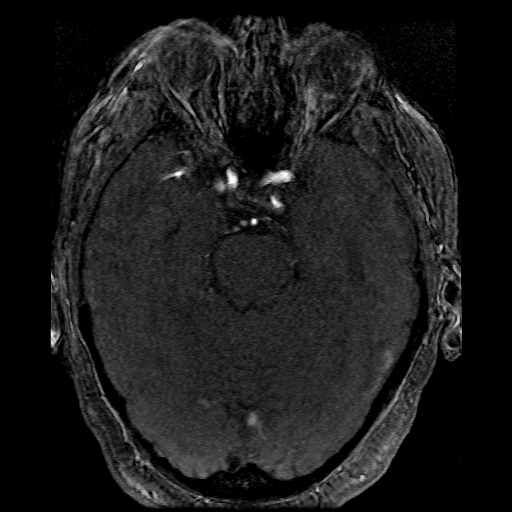

[Series 5: T1 · sagittal · 5.0mm · 0.47mm/px · 2 of 24 slices shown]
[im 1/24]
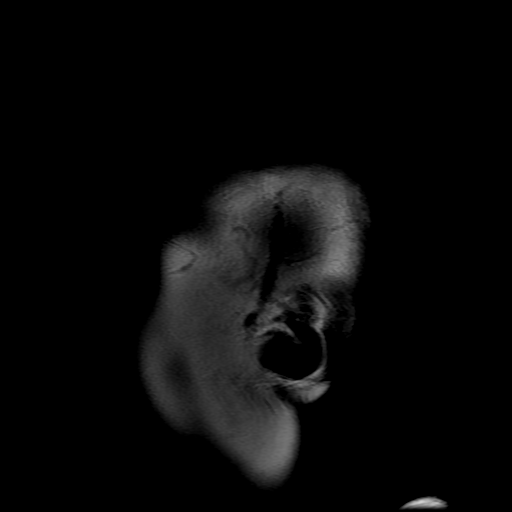
[im 24/24]
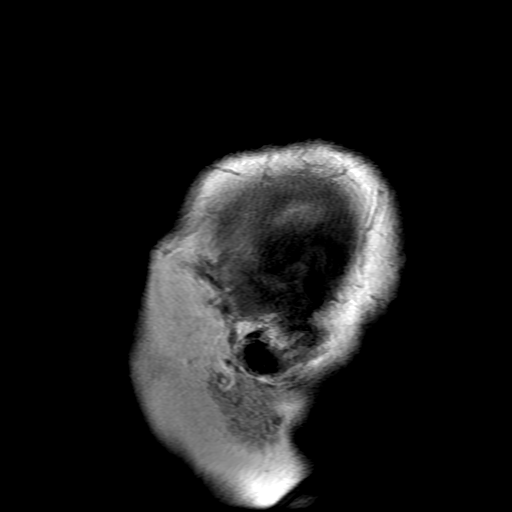

[Series 6: DWI · coronal · 5.0mm · 1.09mm/px · 5 of 66 slices shown (2 of 4)]
[im 1/66]
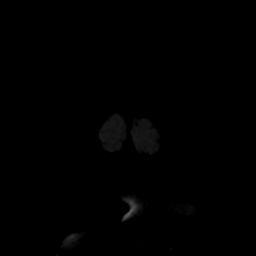
[im 17/66]
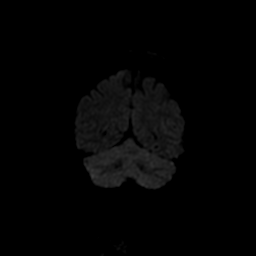
[im 33/66]
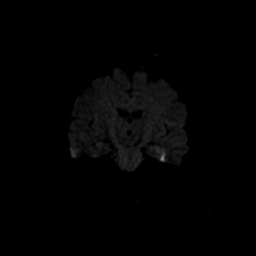
[im 49/66]
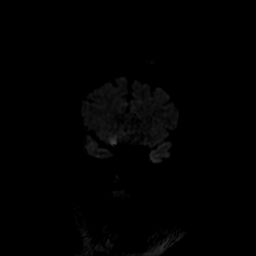
[im 66/66]
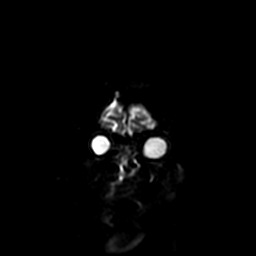

[Series 7: T2 · axial · 5.0mm · 0.43mm/px · z∈[-35,+122]mm · 2 of 28 slices shown (1 of 2)]
[im 1/28]
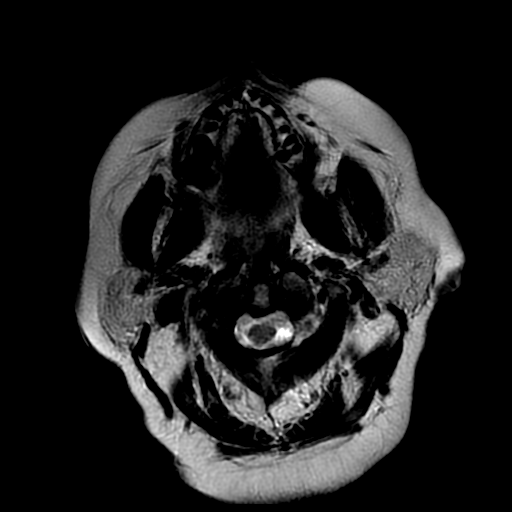
[im 28/28]
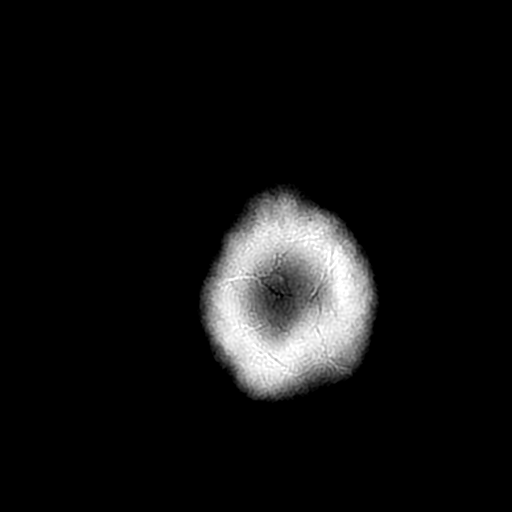

[Series 8: FLAIR · axial · 5.0mm · 0.43mm/px · z∈[-35,+122]mm · 2 of 28 slices shown]
[im 1/28]
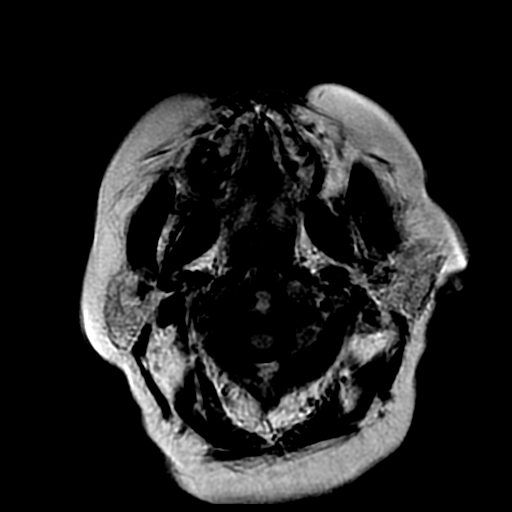
[im 28/28]
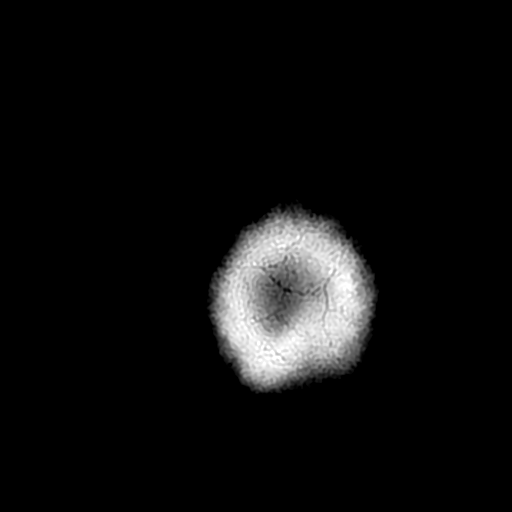

[Series 11: T2 · coronal · 5.0mm · 0.47mm/px · 2 of 26 slices shown (2 of 2)]
[im 1/26]
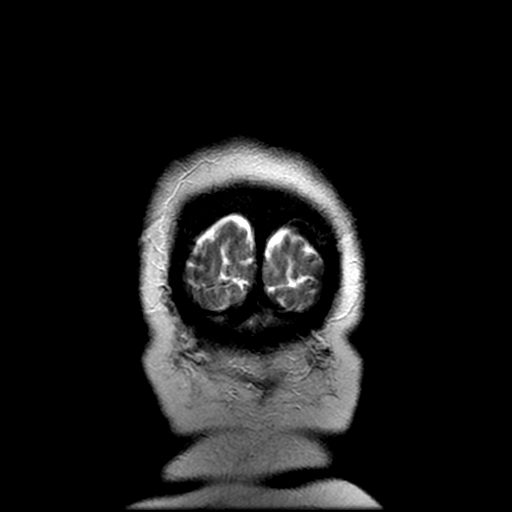
[im 26/26]
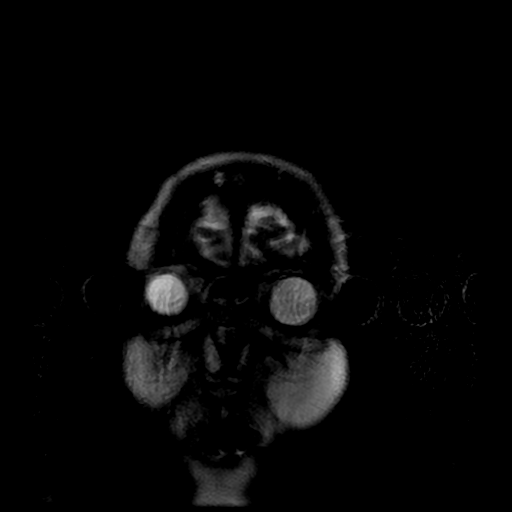

[Series 300: DWI · axial · 3.0mm · 1.09mm/px · z∈[-37,+108]mm · 4 of 51 slices shown (3 of 4)]
[im 1/51]
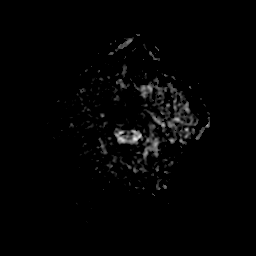
[im 17/51]
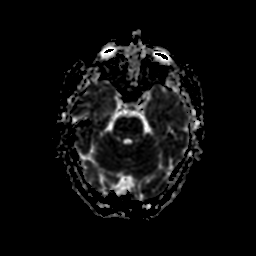
[im 34/51]
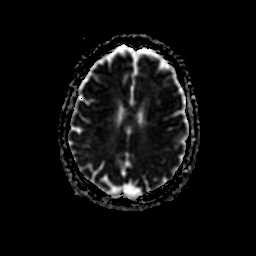
[im 51/51]
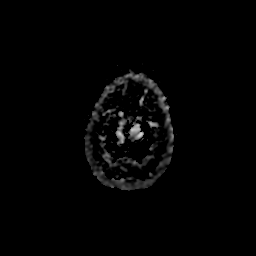

[Series 600: DWI · coronal · 5.0mm · 1.09mm/px · 3 of 33 slices shown (4 of 4)]
[im 1/33]
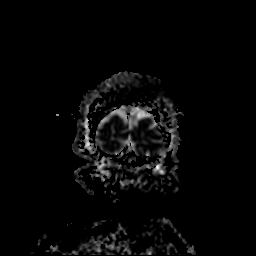
[im 17/33]
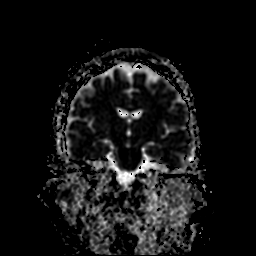
[im 33/33]
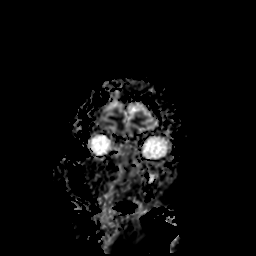

[33 of 48 positions shown; findings below may reference images not displayed]

FINDINGS: MRI HEAD FINDINGS

Brain: Image quality degraded by motion

Negative for acute infarct. Mild chronic microvascular ischemic
change in the white matter. Negative for hydrocephalus, hemorrhage,
or mass.

Vascular: Normal arterial flow voids.

Skull and upper cervical spine: Negative

Sinuses/Orbits: Paranasal sinuses clear. Bilateral lens replacement.
Staphyloma posterior globe bilaterally.

Other: None

MRA HEAD FINDINGS

Image quality degraded by moderate motion.

Both vertebral artery is patent to the basilar. Moderate narrowing
distal right vertebral artery likely congenital due to prominent
right PICA. Basilar widely patent. Posterior cerebral artery is
patent bilaterally. Fetal origin left posterior cerebral artery

Internal carotid artery patent bilaterally. Anterior and middle
cerebral artery patent bilaterally.
IMPRESSION: Image quality degraded by moderate motion

Negative for acute infarct. Mild chronic microvascular ischemic
change in the white matter

Negative MRA head

## 2019-01-30 IMAGING — CT CT HEAD W/O CM
3 series · 16 of 47 positions shown, 19 images · non-contrast
Comparison: 07/09/15 MRI head and 07/12/15 CT head.

CLINICAL DATA: 61 y/o  F; dizziness and weakness.

EXAM:
CT HEAD WITHOUT CONTRAST
TECHNIQUE: Contiguous axial images were obtained from the base of the skull
through the vertex without intravenous contrast.

[Series 2: head wo · axial · 0.47mm/px · z∈[+1500,+1625]mm · 10 of 31 slices shown, 13 images]
[im 3/31  brain]
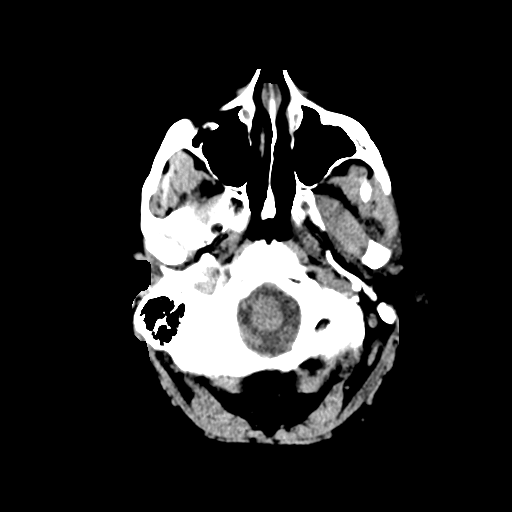
[im 3/31  bone]
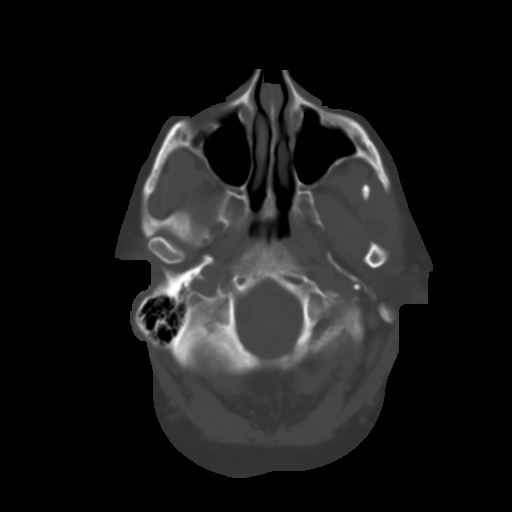
[im 6/31  brain]
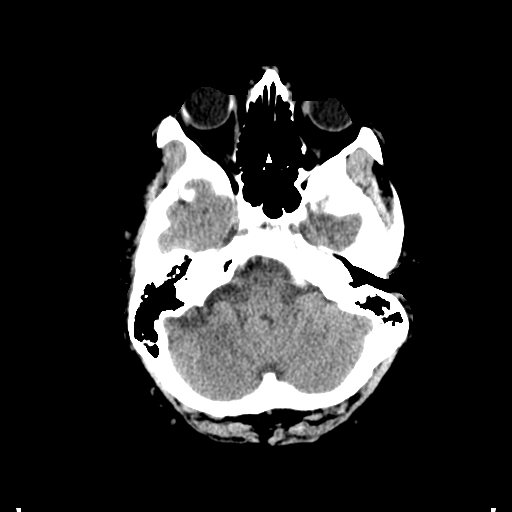
[im 9/31  brain]
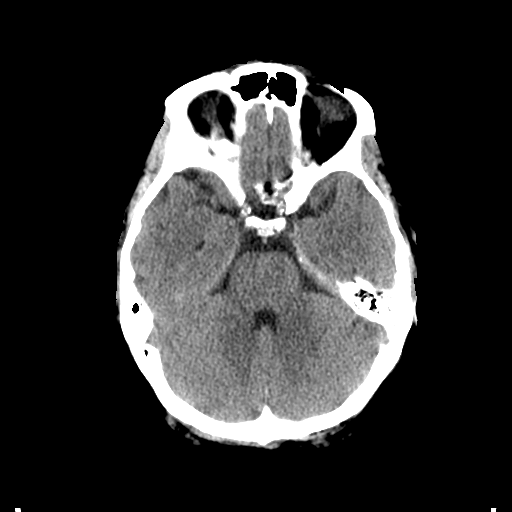
[im 11/31  brain]
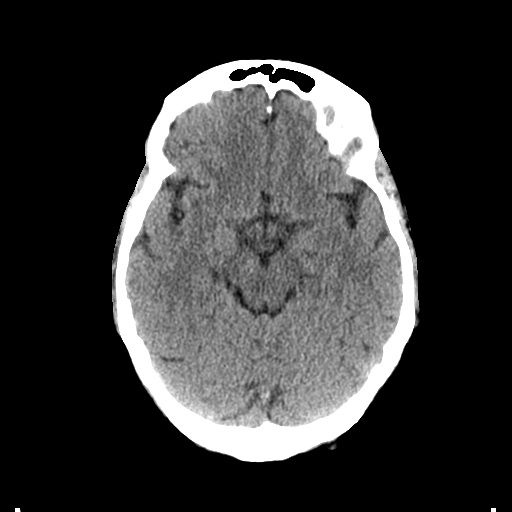
[im 14/31  brain]
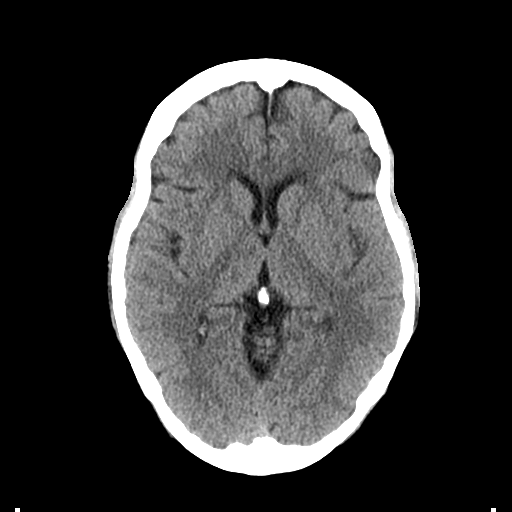
[im 14/31  bone]
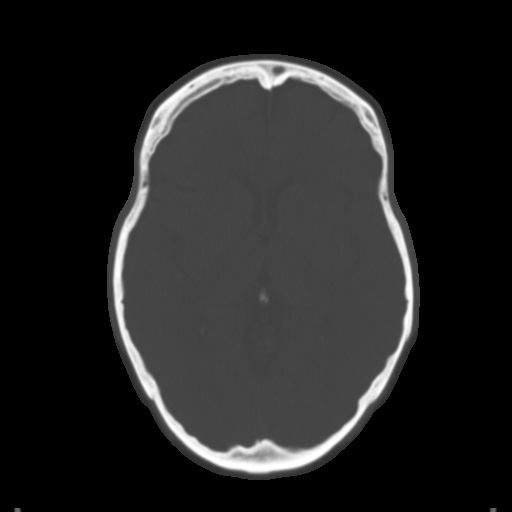
[im 17/31  brain]
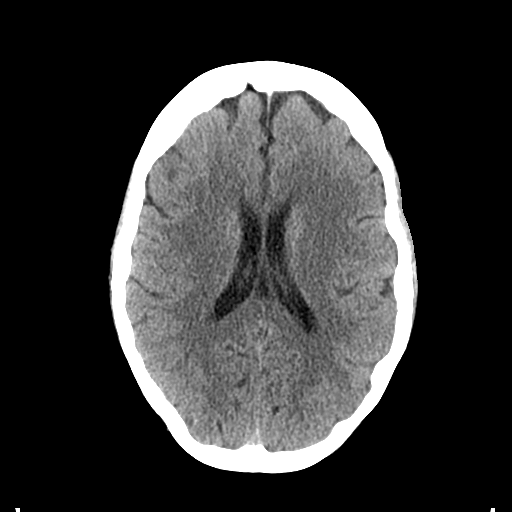
[im 20/31  brain]
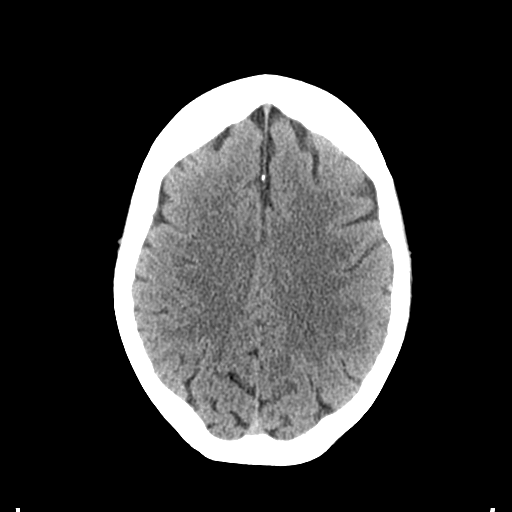
[im 23/31  brain]
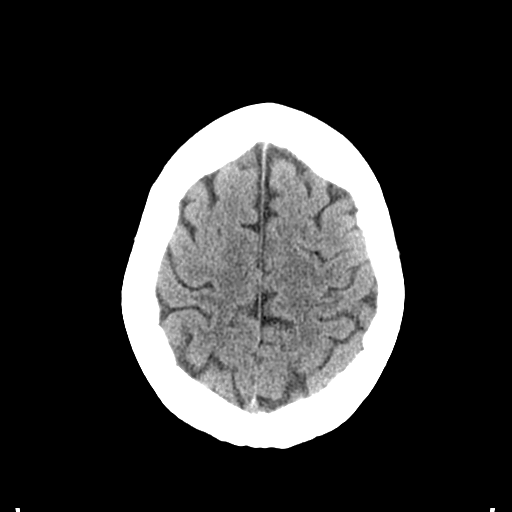
[im 25/31  brain]
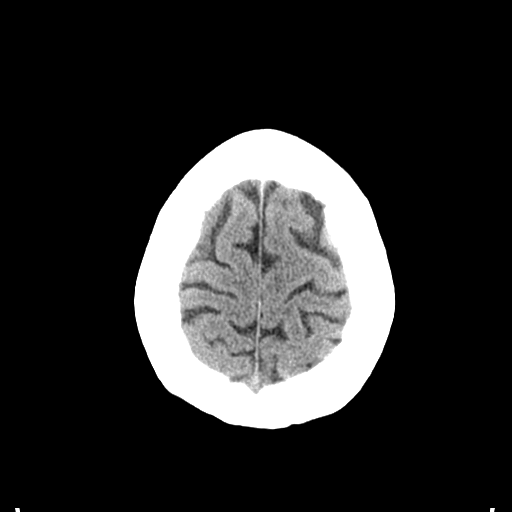
[im 25/31  bone]
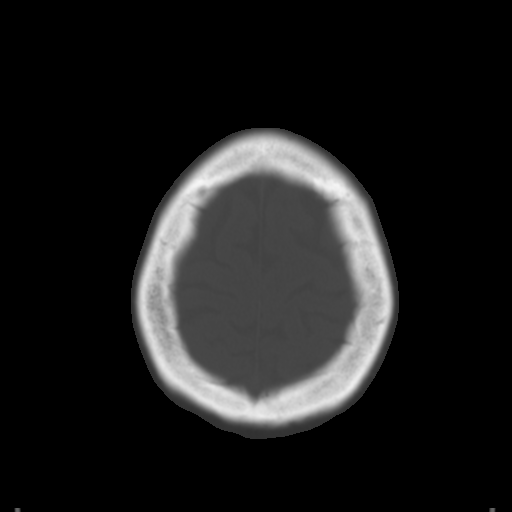
[im 28/31  brain]
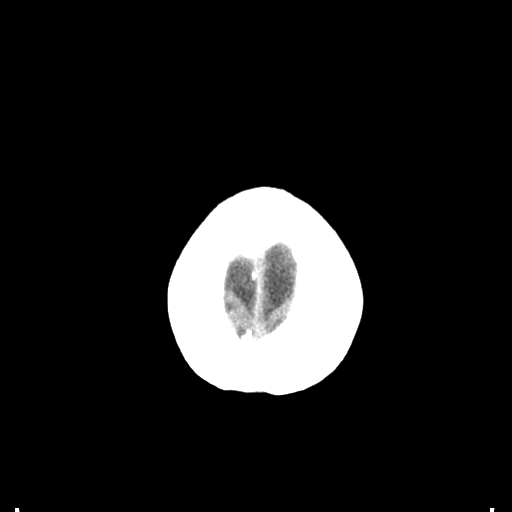

[Series 4: coronal soft tissue · coronal · 0.29mm/px · 3 of 67 slices shown]
[im 23/67  brain]
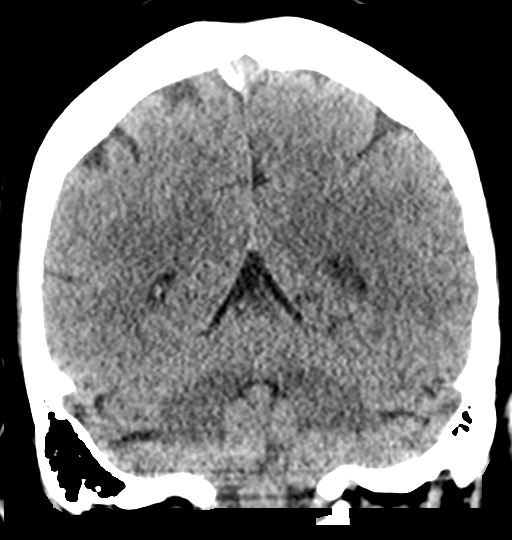
[im 30/67  brain]
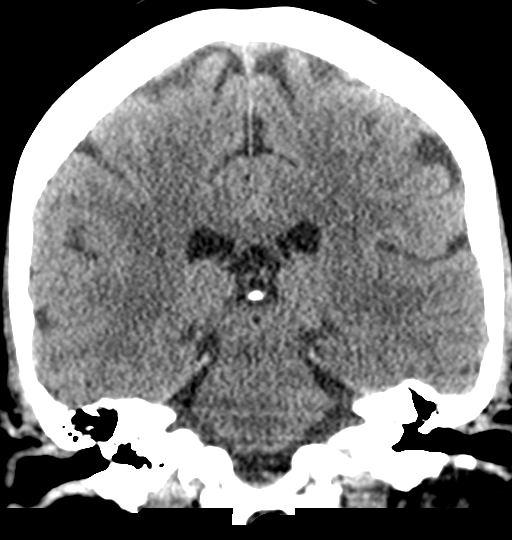
[im 37/67  brain]
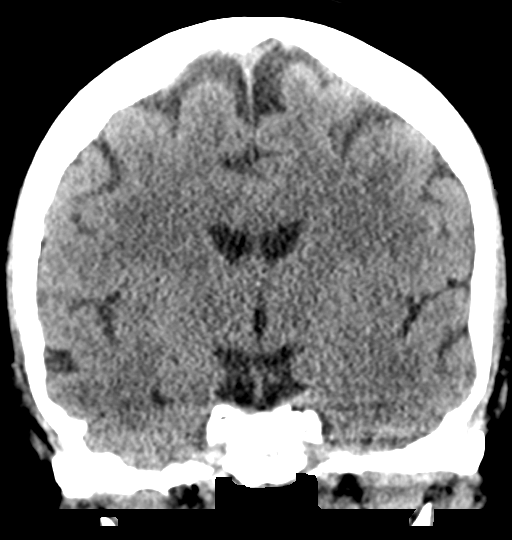

[Series 5: sagittal soft tissue · sagittal · 0.30mm/px · 3 of 50 slices shown]
[im 17/50  brain]
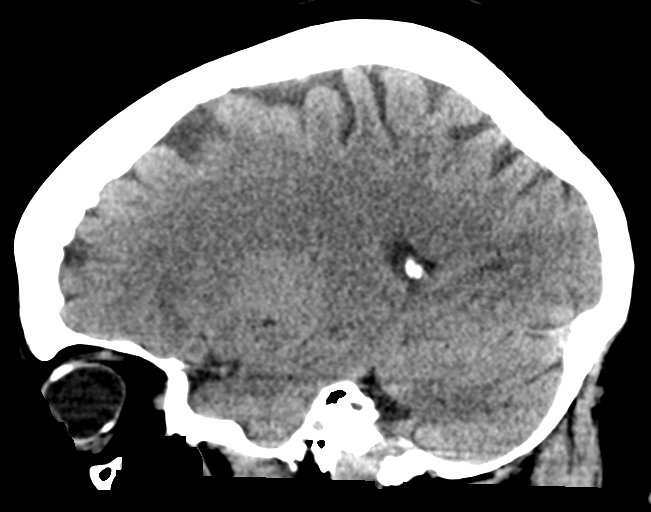
[im 25/50  brain]
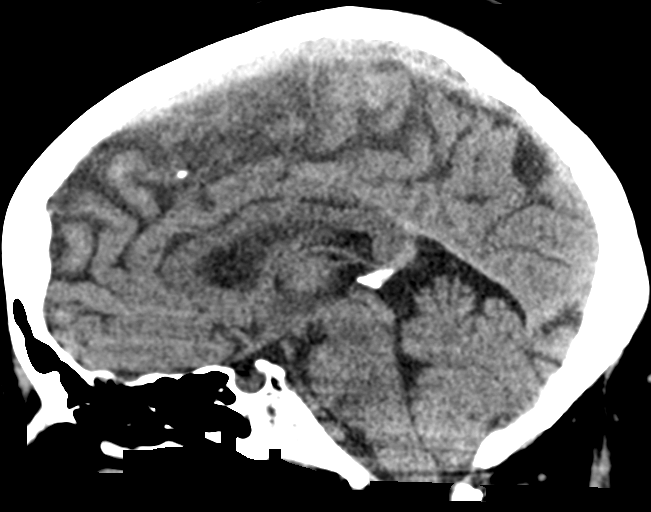
[im 33/50  brain]
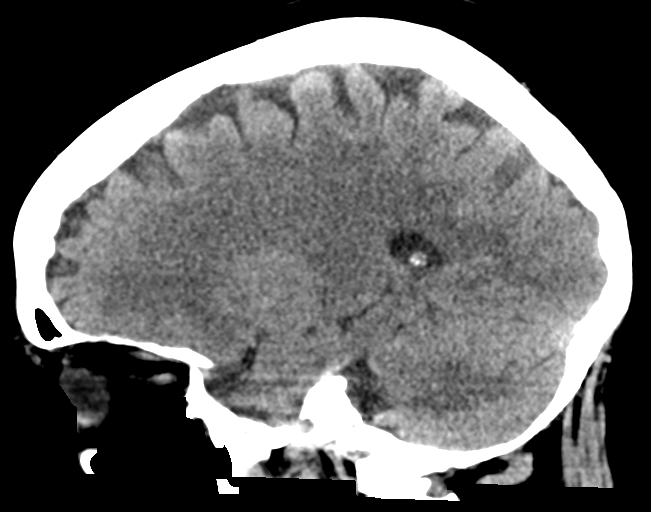

[16 of 47 positions shown; findings below may reference images not displayed]

FINDINGS: Brain: No evidence of acute infarction, hemorrhage, hydrocephalus,
extra-axial collection or mass lesion/mass effect.

Vascular: Mild calcific atherosclerosis of carotid siphons.

Skull: Normal. Negative for fracture or focal lesion.

Sinuses/Orbits: No acute finding.

Other: Right ocular banding.
IMPRESSION: Negative CT of the head.

By: Odilio Billington M.D.

## 2019-03-09 ENCOUNTER — Other Ambulatory Visit (INDEPENDENT_AMBULATORY_CARE_PROVIDER_SITE_OTHER): Payer: Self-pay | Admitting: Orthopaedic Surgery

## 2019-03-10 NOTE — Telephone Encounter (Signed)
Please advise 

## 2019-06-11 ENCOUNTER — Other Ambulatory Visit: Payer: Self-pay | Admitting: Internal Medicine

## 2019-06-11 DIAGNOSIS — Z1231 Encounter for screening mammogram for malignant neoplasm of breast: Secondary | ICD-10-CM

## 2019-06-26 DIAGNOSIS — D649 Anemia, unspecified: Secondary | ICD-10-CM | POA: Insufficient documentation

## 2019-08-05 ENCOUNTER — Other Ambulatory Visit: Payer: Self-pay

## 2019-08-05 ENCOUNTER — Ambulatory Visit
Admission: RE | Admit: 2019-08-05 | Discharge: 2019-08-05 | Disposition: A | Discharge status: Home / Self Care | Payer: Medicare Other | Source: Ambulatory Visit | Attending: Internal Medicine | Admitting: Internal Medicine

## 2019-08-05 DIAGNOSIS — Z1231 Encounter for screening mammogram for malignant neoplasm of breast: Secondary | ICD-10-CM

## 2019-08-07 ENCOUNTER — Other Ambulatory Visit: Payer: Self-pay | Admitting: Internal Medicine

## 2019-08-07 DIAGNOSIS — R928 Other abnormal and inconclusive findings on diagnostic imaging of breast: Secondary | ICD-10-CM

## 2019-08-12 ENCOUNTER — Ambulatory Visit
Admission: RE | Admit: 2019-08-12 | Discharge: 2019-08-12 | Disposition: A | Discharge status: Home / Self Care | Payer: Medicare Other | Source: Ambulatory Visit | Attending: Internal Medicine | Admitting: Internal Medicine

## 2019-08-12 ENCOUNTER — Ambulatory Visit: Payer: Medicare Other

## 2019-08-12 ENCOUNTER — Other Ambulatory Visit: Payer: Self-pay

## 2019-08-12 DIAGNOSIS — R928 Other abnormal and inconclusive findings on diagnostic imaging of breast: Secondary | ICD-10-CM

## 2019-10-28 DIAGNOSIS — R7301 Impaired fasting glucose: Secondary | ICD-10-CM | POA: Insufficient documentation

## 2019-11-11 ENCOUNTER — Other Ambulatory Visit: Payer: Self-pay

## 2019-11-11 ENCOUNTER — Ambulatory Visit: Payer: Medicare PPO | Admitting: Orthopaedic Surgery

## 2019-11-11 ENCOUNTER — Ambulatory Visit (INDEPENDENT_AMBULATORY_CARE_PROVIDER_SITE_OTHER): Payer: Medicare PPO

## 2019-11-11 ENCOUNTER — Encounter: Payer: Self-pay | Admitting: Orthopaedic Surgery

## 2019-11-11 ENCOUNTER — Ambulatory Visit: Payer: Self-pay

## 2019-11-11 DIAGNOSIS — M25561 Pain in right knee: Secondary | ICD-10-CM

## 2019-11-11 DIAGNOSIS — G8929 Other chronic pain: Secondary | ICD-10-CM | POA: Diagnosis not present

## 2019-11-11 DIAGNOSIS — M1712 Unilateral primary osteoarthritis, left knee: Secondary | ICD-10-CM | POA: Diagnosis not present

## 2019-11-11 DIAGNOSIS — M25562 Pain in left knee: Secondary | ICD-10-CM

## 2019-11-11 DIAGNOSIS — Z96651 Presence of right artificial knee joint: Secondary | ICD-10-CM | POA: Diagnosis not present

## 2019-11-11 NOTE — Progress Notes (Signed)
Office Visit Note   Patient: [name]           Date of Birth: December 01, 1955           MRN: [id number] Visit Date: 11/11/2019              Requested by: Crist Infante, MD [address] [place],  [place] [address] PCP: Crist Infante, MD   Assessment & Plan: Visit Diagnoses:  1. Status post right knee replacement   2. Chronic pain of both knees   3. Primary osteoarthritis of left knee     Plan: Impression is 2 years status post right total knee replacement and doing well.  Her left knee continues to have mild degenerative joint disease that is advanced.  She is not interested in a cortisone injection today.  We will see her back in a year for 3-year visit for right knee replacement with two-view x-rays of the right knee.  She will give [place] a call when she is ready to have the left knee replaced.  Follow-Up Instructions: Return in about 1 year (around 11/10/2020).   Orders:  Orders Placed This Encounter  Procedures  . XR KNEE 3 VIEW RIGHT  . XR KNEE 3 VIEW LEFT   No orders of the defined types were placed in this encounter.     Procedures: No procedures performed   Clinical Data: No additional findings.   Subjective: Chief Complaint  Patient presents with  . Right Knee - Pain    Buna follows up today for her 2-year visit status post right total knee replacement.  She is doing well has some soreness at times.  Her left knee is more bothersome especially at night.  She takes gabapentin and ibuprofen.  The left knee has gotten worse.  The right knee is doing well.   Review of Systems   Objective: Vital Signs: There were no vitals taken for this visit.  Physical Exam  Ortho Exam Right knee exam shows a fully healed surgical scar has excellent range of motion without pain.  Left knee exam shows moderate pain with range of motion and patellofemoral crepitus.  Collaterals and cruciates are stable. Specialty Comments:  No specialty comments  available.  Imaging: XR KNEE 3 VIEW LEFT  Result Date: 11/11/2019 Stable advanced tricompartmental DJD.  XR KNEE 3 VIEW RIGHT  Result Date: 11/11/2019 Stable total knee replacement without complication.    PMFS History: Patient Active Problem List   Diagnosis Date Noted  . Primary osteoarthritis of left knee 11/11/2019  . Chronic right shoulder pain 05/09/2018  . Status post right knee replacement 11/08/2017  . Benign paroxysmal positional vertigo of right ear 10/03/2017  . Vertigo 09/25/2017  . GERD (gastroesophageal reflux disease) 09/25/2017  . HLD (hyperlipidemia) 09/25/2017  . Obesity 09/25/2017  . Intractable vomiting with nausea   . Memory loss 05/21/2017  . Acute low back pain with sciatica 12/08/2016  . Lumbar radiculopathy 12/07/2015  . Abnormality of gait 12/07/2015  . Cervical stenosis of spinal canal 11/02/2015  . Low back pain 10/19/2015  . Spinal stenosis in cervical region 10/19/2015  . Bilateral hip pain 10/19/2015  . Cervical spondylosis without myelopathy 08/10/2015  . Excessive daytime sleepiness 08/10/2015  . Right optic neuritis 08/10/2015  . Abnormal finding on MRI of brain 08/10/2015  . Cataract of left eye 08/03/2015  . Detached retina 08/03/2015  . Carotid artery narrowing 07/21/2015  . Abnormal glucose level 07/21/2015  . Hypertensive retinopathy 07/19/2015  . Edema,  retina 07/19/2015  . Optic neuritis 07/10/2015  . Hypertension 07/10/2015  . Arthritis 07/10/2015  . Chest pain 01/21/2015  . Leg pain 01/21/2015  . Pseudoaphakia 12/22/2013  . 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 Blepharitis 12/12/2011  . Error, refractive, myopia 12/12/2011  . Posterior vitreous detachment 12/12/2011   Past Medical History:  Diagnosis Date  . Anxiety   . Arthritis   . Deviated septum   . Dyspnea    with exertion   wheezing at night  . Fibroid tumor   . GERD (gastroesophageal reflux disease)   .  Hepatitis    AS CHILD    . Hypercholesteremia   . Hypertension   . Memory loss   . Meniscus tear   . MRSA infection   . PONV (postoperative nausea and vomiting)   . Retinal detachment   . Retinal detachment   . Spinal stenosis   . Urinary frequency     Family History  Problem Relation Age of Onset  . Stroke Mother   . Arrhythmia Mother   . Bladder Cancer Mother   . Thyroid disease Mother   . Heart disease Father   . Heart attack Father   . Stroke Maternal Grandfather   . Stroke Maternal Grandmother     Past Surgical History:  Procedure Laterality Date  . ANTERIOR CERVICAL DECOMP/DISCECTOMY FUSION N/A 11/02/2015   Procedure: ANTERIOR CERVICAL DECOMPRESSION FUSION CERVICAL FIVE-SIX,CERVICAL SIX-SEVEN;  Surgeon: Karie Chimera, MD;  Location: [facility];  Service: Neurosurgery;  Laterality: N/A;  right side approach  . CATARACT EXTRACTION Right   . fibroid tumor    . KNEE SURGERY Bilateral   . RETINAL DETACHMENT SURGERY Right   . TOTAL KNEE ARTHROPLASTY Right 11/08/2017  . TOTAL KNEE ARTHROPLASTY Right 11/08/2017   Procedure: RIGHT TOTAL KNEE ARTHROPLASTY;  Surgeon: Leandrew Koyanagi, MD;  Location: [facility];  Service: Orthopedics;  Laterality: Right;   Social History   Occupational History  . Occupation: caregiver    Comment: Part-time  Tobacco Use  . Smoking status: Never Smoker  . Smokeless tobacco: Never Used  Substance and Sexual Activity  . Alcohol use: No  . Drug use: No  . Sexual activity: Not on file

## 2019-11-19 ENCOUNTER — Telehealth (HOSPITAL_COMMUNITY): Payer: Self-pay

## 2019-11-19 NOTE — Telephone Encounter (Signed)
The above patient or their representative was contacted and gave the following answers to these questions:         Do you have any of the following symptoms?    NO  Fever                    Cough                   Shortness of breath  Do  you have any of the following other symptoms?    muscle pain         vomiting,        diarrhea        rash         weakness        red eye        abdominal pain         bruising          bruising or bleeding              joint pain           severe headache    Have you been in contact with someone who was or has been sick in the past 2 weeks?  NO  Yes                 Unsure                         Unable to assess   Does the person that you were in contact with have any of the following symptoms?   Cough         shortness of breath           muscle pain         vomiting,            diarrhea            rash            weakness           fever            red eye           abdominal pain           bruising  or  bleeding                joint pain                severe headache                 COMMENTS OR ACTION PLAN FOR THIS PATIENT:        ALL QUESTIONS WERE ANSWERED/[facility] PATIENT HAS HAD BOTH VACCINES/[facility]

## 2019-11-21 ENCOUNTER — Other Ambulatory Visit (HOSPITAL_COMMUNITY): Payer: Self-pay | Admitting: Internal Medicine

## 2019-11-21 ENCOUNTER — Other Ambulatory Visit: Payer: Self-pay

## 2019-11-21 ENCOUNTER — Ambulatory Visit (HOSPITAL_COMMUNITY)
Admission: RE | Admit: 2019-11-21 | Discharge: 2019-11-21 | Disposition: A | Discharge status: Home / Self Care | Payer: Medicare PPO | Source: Ambulatory Visit | Attending: Vascular Surgery | Admitting: Vascular Surgery

## 2019-11-21 DIAGNOSIS — I6529 Occlusion and stenosis of unspecified carotid artery: Secondary | ICD-10-CM | POA: Insufficient documentation

## 2019-12-30 DIAGNOSIS — Z1211 Encounter for screening for malignant neoplasm of colon: Secondary | ICD-10-CM | POA: Diagnosis not present

## 2019-12-30 DIAGNOSIS — Z1212 Encounter for screening for malignant neoplasm of rectum: Secondary | ICD-10-CM | POA: Diagnosis not present

## 2020-01-04 LAB — COLOGUARD: COLOGUARD: NEGATIVE

## 2020-02-17 DIAGNOSIS — M48061 Spinal stenosis, lumbar region without neurogenic claudication: Secondary | ICD-10-CM | POA: Diagnosis not present

## 2020-02-20 ENCOUNTER — Ambulatory Visit: Payer: Medicare PPO | Admitting: Podiatry

## 2020-02-20 ENCOUNTER — Ambulatory Visit (INDEPENDENT_AMBULATORY_CARE_PROVIDER_SITE_OTHER): Payer: Medicare PPO

## 2020-02-20 ENCOUNTER — Other Ambulatory Visit: Payer: Self-pay

## 2020-02-20 VITALS — Temp 96.8°F

## 2020-02-20 DIAGNOSIS — M79671 Pain in right foot: Secondary | ICD-10-CM

## 2020-02-20 DIAGNOSIS — M722 Plantar fascial fibromatosis: Secondary | ICD-10-CM

## 2020-02-25 NOTE — Progress Notes (Signed)
Subjective:   Patient ID: [name], female   DOB: 64 y.o.   MRN: [id number]   HPI Patient presents stating she has had severe pain in the bottom of her right heel and states it is been present for a while and states it is worsened over the last couple months.  Patient has tried shoe gear modifications ice without relief and patient does not smoke likes to be active   Review of Systems  All other systems reviewed and are negative.       Objective:  Physical Exam Vitals and nursing note reviewed.  Constitutional:      Appearance: She is well-developed.  Pulmonary:     Effort: Pulmonary effort is normal.  Musculoskeletal:        General: Normal range of motion.  Skin:    General: Skin is warm.  Neurological:     Mental Status: She is alert.     Neurovascular status intact muscle strength found to be adequate range of motion within normal limits.  Patient is noted to have exquisite discomfort plantar aspect right heel at the insertional point of the tendon into the calcaneus with inflammation fluid around the medial band.  Moderate depression of the arch no equinus is noted with good digital perfusion noted    Assessment:  Acute plantar fasciitis right with inflammation fluid buildup     Plan:  H&P reviewed condition sterile prep done and injected the plantar fascia right 3 mg Kenalog 5 mg Xylocaine applied fascial brace with instructions on usage and reappoint to recheck again in several weeks  X-rays indicate small spur no indication to stress fracture or arthritis of an advanced nature

## 2020-03-02 DIAGNOSIS — H35063 Retinal vasculitis, bilateral: Secondary | ICD-10-CM | POA: Diagnosis not present

## 2020-03-02 DIAGNOSIS — H353211 Exudative age-related macular degeneration, right eye, with active choroidal neovascularization: Secondary | ICD-10-CM | POA: Diagnosis not present

## 2020-03-02 DIAGNOSIS — H3321 Serous retinal detachment, right eye: Secondary | ICD-10-CM | POA: Diagnosis not present

## 2020-03-02 DIAGNOSIS — Z961 Presence of intraocular lens: Secondary | ICD-10-CM | POA: Diagnosis not present

## 2020-03-02 DIAGNOSIS — H469 Unspecified optic neuritis: Secondary | ICD-10-CM | POA: Diagnosis not present

## 2020-03-02 DIAGNOSIS — Z79899 Other long term (current) drug therapy: Secondary | ICD-10-CM | POA: Diagnosis not present

## 2020-03-02 DIAGNOSIS — H35033 Hypertensive retinopathy, bilateral: Secondary | ICD-10-CM | POA: Diagnosis not present

## 2020-03-09 ENCOUNTER — Other Ambulatory Visit: Payer: Self-pay | Admitting: Podiatry

## 2020-03-09 DIAGNOSIS — M722 Plantar fascial fibromatosis: Secondary | ICD-10-CM

## 2020-03-10 DIAGNOSIS — Z6832 Body mass index (BMI) 32.0-32.9, adult: Secondary | ICD-10-CM | POA: Diagnosis not present

## 2020-03-10 DIAGNOSIS — G8929 Other chronic pain: Secondary | ICD-10-CM | POA: Diagnosis not present

## 2020-03-10 DIAGNOSIS — E669 Obesity, unspecified: Secondary | ICD-10-CM | POA: Diagnosis not present

## 2020-03-10 DIAGNOSIS — K5900 Constipation, unspecified: Secondary | ICD-10-CM | POA: Diagnosis not present

## 2020-03-10 DIAGNOSIS — K219 Gastro-esophageal reflux disease without esophagitis: Secondary | ICD-10-CM | POA: Diagnosis not present

## 2020-03-10 DIAGNOSIS — G2581 Restless legs syndrome: Secondary | ICD-10-CM | POA: Diagnosis not present

## 2020-03-10 DIAGNOSIS — H3530 Unspecified macular degeneration: Secondary | ICD-10-CM | POA: Diagnosis not present

## 2020-03-10 DIAGNOSIS — Z7982 Long term (current) use of aspirin: Secondary | ICD-10-CM | POA: Diagnosis not present

## 2020-03-10 DIAGNOSIS — E785 Hyperlipidemia, unspecified: Secondary | ICD-10-CM | POA: Diagnosis not present

## 2020-03-10 DIAGNOSIS — I10 Essential (primary) hypertension: Secondary | ICD-10-CM | POA: Diagnosis not present

## 2020-03-10 DIAGNOSIS — G4700 Insomnia, unspecified: Secondary | ICD-10-CM | POA: Diagnosis not present

## 2020-03-16 DIAGNOSIS — M62561 Muscle wasting and atrophy, not elsewhere classified, right lower leg: Secondary | ICD-10-CM | POA: Diagnosis not present

## 2020-03-16 DIAGNOSIS — M48061 Spinal stenosis, lumbar region without neurogenic claudication: Secondary | ICD-10-CM | POA: Diagnosis not present

## 2020-03-16 DIAGNOSIS — M62562 Muscle wasting and atrophy, not elsewhere classified, left lower leg: Secondary | ICD-10-CM | POA: Diagnosis not present

## 2020-03-25 DIAGNOSIS — M722 Plantar fascial fibromatosis: Secondary | ICD-10-CM | POA: Insufficient documentation

## 2020-05-12 ENCOUNTER — Other Ambulatory Visit: Payer: Self-pay | Admitting: Internal Medicine

## 2020-05-12 ENCOUNTER — Other Ambulatory Visit: Payer: Self-pay

## 2020-05-12 ENCOUNTER — Encounter: Payer: Self-pay | Admitting: Podiatry

## 2020-05-12 ENCOUNTER — Ambulatory Visit: Payer: Medicare PPO | Admitting: Podiatry

## 2020-05-12 DIAGNOSIS — M545 Low back pain, unspecified: Secondary | ICD-10-CM

## 2020-05-12 DIAGNOSIS — G2581 Restless legs syndrome: Secondary | ICD-10-CM | POA: Diagnosis not present

## 2020-05-12 DIAGNOSIS — M778 Other enthesopathies, not elsewhere classified: Secondary | ICD-10-CM

## 2020-05-12 DIAGNOSIS — R350 Frequency of micturition: Secondary | ICD-10-CM | POA: Diagnosis not present

## 2020-05-12 DIAGNOSIS — D649 Anemia, unspecified: Secondary | ICD-10-CM | POA: Diagnosis not present

## 2020-05-12 DIAGNOSIS — G959 Disease of spinal cord, unspecified: Secondary | ICD-10-CM | POA: Diagnosis not present

## 2020-05-12 DIAGNOSIS — I10 Essential (primary) hypertension: Secondary | ICD-10-CM | POA: Diagnosis not present

## 2020-05-12 DIAGNOSIS — R7301 Impaired fasting glucose: Secondary | ICD-10-CM | POA: Diagnosis not present

## 2020-05-12 DIAGNOSIS — K219 Gastro-esophageal reflux disease without esophagitis: Secondary | ICD-10-CM | POA: Diagnosis not present

## 2020-05-12 DIAGNOSIS — G4733 Obstructive sleep apnea (adult) (pediatric): Secondary | ICD-10-CM | POA: Diagnosis not present

## 2020-05-13 NOTE — Progress Notes (Signed)
Subjective:   Patient ID: [name], female   DOB: 64 y.o.   MRN: [id number]   HPI Patient presents stating she has pain on top of her right foot that is been sore and inflamed and makes walking difficult   ROS      Objective:  Physical Exam  Neurovascular status intact with extensor tendinitis right that is inflamed and sore with walking     Assessment:  Chronic extensor tendinitis right     Plan:  Sterile prep done and injected the extensor complex 3 mg Kenalog 5 mg Xylocaine and advised on heat ice therapy and reappoint to recheck

## 2020-05-18 DIAGNOSIS — Z961 Presence of intraocular lens: Secondary | ICD-10-CM | POA: Diagnosis not present

## 2020-05-18 DIAGNOSIS — Z79899 Other long term (current) drug therapy: Secondary | ICD-10-CM | POA: Diagnosis not present

## 2020-05-18 DIAGNOSIS — H469 Unspecified optic neuritis: Secondary | ICD-10-CM | POA: Diagnosis not present

## 2020-05-18 DIAGNOSIS — H3321 Serous retinal detachment, right eye: Secondary | ICD-10-CM | POA: Diagnosis not present

## 2020-05-18 DIAGNOSIS — H353211 Exudative age-related macular degeneration, right eye, with active choroidal neovascularization: Secondary | ICD-10-CM | POA: Diagnosis not present

## 2020-05-18 DIAGNOSIS — H35033 Hypertensive retinopathy, bilateral: Secondary | ICD-10-CM | POA: Diagnosis not present

## 2020-05-18 DIAGNOSIS — H35063 Retinal vasculitis, bilateral: Secondary | ICD-10-CM | POA: Diagnosis not present

## 2020-05-25 DIAGNOSIS — M48061 Spinal stenosis, lumbar region without neurogenic claudication: Secondary | ICD-10-CM | POA: Diagnosis not present

## 2020-06-09 ENCOUNTER — Other Ambulatory Visit: Payer: Self-pay

## 2020-06-09 ENCOUNTER — Ambulatory Visit
Admission: RE | Admit: 2020-06-09 | Discharge: 2020-06-09 | Disposition: A | Discharge status: Home / Self Care | Payer: Medicare PPO | Source: Ambulatory Visit | Attending: Internal Medicine | Admitting: Internal Medicine

## 2020-06-09 DIAGNOSIS — M48061 Spinal stenosis, lumbar region without neurogenic claudication: Secondary | ICD-10-CM | POA: Diagnosis not present

## 2020-06-09 DIAGNOSIS — M545 Low back pain, unspecified: Secondary | ICD-10-CM

## 2020-06-29 ENCOUNTER — Other Ambulatory Visit: Payer: Self-pay | Admitting: Internal Medicine

## 2020-06-29 DIAGNOSIS — Z1231 Encounter for screening mammogram for malignant neoplasm of breast: Secondary | ICD-10-CM

## 2020-06-29 DIAGNOSIS — M48062 Spinal stenosis, lumbar region with neurogenic claudication: Secondary | ICD-10-CM | POA: Diagnosis not present

## 2020-07-15 ENCOUNTER — Encounter: Payer: Self-pay | Admitting: Podiatry

## 2020-08-03 DIAGNOSIS — H35033 Hypertensive retinopathy, bilateral: Secondary | ICD-10-CM | POA: Diagnosis not present

## 2020-08-03 DIAGNOSIS — H35063 Retinal vasculitis, bilateral: Secondary | ICD-10-CM | POA: Diagnosis not present

## 2020-08-03 DIAGNOSIS — H353211 Exudative age-related macular degeneration, right eye, with active choroidal neovascularization: Secondary | ICD-10-CM | POA: Diagnosis not present

## 2020-08-03 DIAGNOSIS — H469 Unspecified optic neuritis: Secondary | ICD-10-CM | POA: Diagnosis not present

## 2020-08-03 DIAGNOSIS — H3321 Serous retinal detachment, right eye: Secondary | ICD-10-CM | POA: Diagnosis not present

## 2020-08-03 DIAGNOSIS — Z79899 Other long term (current) drug therapy: Secondary | ICD-10-CM | POA: Diagnosis not present

## 2020-08-03 DIAGNOSIS — Z961 Presence of intraocular lens: Secondary | ICD-10-CM | POA: Diagnosis not present

## 2020-08-10 ENCOUNTER — Other Ambulatory Visit: Payer: Self-pay

## 2020-08-10 ENCOUNTER — Ambulatory Visit: Payer: Medicare PPO | Admitting: Podiatry

## 2020-08-10 ENCOUNTER — Ambulatory Visit: Payer: Medicare PPO

## 2020-08-10 ENCOUNTER — Encounter: Payer: Self-pay | Admitting: Podiatry

## 2020-08-10 DIAGNOSIS — M778 Other enthesopathies, not elsewhere classified: Secondary | ICD-10-CM

## 2020-08-10 DIAGNOSIS — M722 Plantar fascial fibromatosis: Secondary | ICD-10-CM

## 2020-08-10 NOTE — Progress Notes (Signed)
She presents today states that the shots have not been helping though the lateral aspect of her foot is no longer painful and the dorsal aspect of her foot is no longer painful but she has started developing on the dorsal medial aspect of the foot and still has pain in the heel.  Objective: Vital signs are stable alert oriented x3.  Pulses are palpable.  I reviewed her past medical history medications allergies surgeries and x-rays.  She has pain on palpation medial calcaneal tubercle and to a lesser degree at the tibialis anterior insertion site.  Assessment: Plan fasciitis with lateral compensatory syndrome and very early tibialis anterior tendinitis.  Plan: Discussed etiology pathology conservative versus surgical therapies at this point I injected her heel 20 mg Kenalog 5 mg Marcaine medial aspect tolerated procedure well without complications provided her with a plantar fascial brace and we discussed appropriate shoe gear stretching exercises and ice therapy.

## 2020-08-11 DIAGNOSIS — M48062 Spinal stenosis, lumbar region with neurogenic claudication: Secondary | ICD-10-CM | POA: Diagnosis not present

## 2020-08-11 DIAGNOSIS — I10 Essential (primary) hypertension: Secondary | ICD-10-CM | POA: Diagnosis not present

## 2020-08-11 DIAGNOSIS — Z006 Encounter for examination for normal comparison and control in clinical research program: Secondary | ICD-10-CM | POA: Diagnosis not present

## 2020-08-11 DIAGNOSIS — Z6833 Body mass index (BMI) 33.0-33.9, adult: Secondary | ICD-10-CM | POA: Diagnosis not present

## 2020-08-24 ENCOUNTER — Other Ambulatory Visit: Payer: Self-pay

## 2020-08-24 ENCOUNTER — Ambulatory Visit
Admission: RE | Admit: 2020-08-24 | Discharge: 2020-08-24 | Disposition: A | Discharge status: Home / Self Care | Payer: Medicare PPO | Source: Ambulatory Visit | Attending: Internal Medicine | Admitting: Internal Medicine

## 2020-08-24 DIAGNOSIS — Z1231 Encounter for screening mammogram for malignant neoplasm of breast: Secondary | ICD-10-CM | POA: Diagnosis not present

## 2020-08-31 DIAGNOSIS — M48062 Spinal stenosis, lumbar region with neurogenic claudication: Secondary | ICD-10-CM | POA: Diagnosis not present

## 2020-08-31 DIAGNOSIS — Z006 Encounter for examination for normal comparison and control in clinical research program: Secondary | ICD-10-CM | POA: Diagnosis not present

## 2020-09-01 DIAGNOSIS — H16223 Keratoconjunctivitis sicca, not specified as Sjogren's, bilateral: Secondary | ICD-10-CM | POA: Diagnosis not present

## 2020-09-21 DIAGNOSIS — M48062 Spinal stenosis, lumbar region with neurogenic claudication: Secondary | ICD-10-CM | POA: Diagnosis not present

## 2020-10-05 DIAGNOSIS — H3321 Serous retinal detachment, right eye: Secondary | ICD-10-CM | POA: Diagnosis not present

## 2020-10-05 DIAGNOSIS — H35063 Retinal vasculitis, bilateral: Secondary | ICD-10-CM | POA: Diagnosis not present

## 2020-10-05 DIAGNOSIS — H469 Unspecified optic neuritis: Secondary | ICD-10-CM | POA: Diagnosis not present

## 2020-10-05 DIAGNOSIS — Z79899 Other long term (current) drug therapy: Secondary | ICD-10-CM | POA: Diagnosis not present

## 2020-10-05 DIAGNOSIS — H353211 Exudative age-related macular degeneration, right eye, with active choroidal neovascularization: Secondary | ICD-10-CM | POA: Diagnosis not present

## 2020-10-05 DIAGNOSIS — H35033 Hypertensive retinopathy, bilateral: Secondary | ICD-10-CM | POA: Diagnosis not present

## 2020-10-05 DIAGNOSIS — Z961 Presence of intraocular lens: Secondary | ICD-10-CM | POA: Diagnosis not present

## 2020-11-02 ENCOUNTER — Other Ambulatory Visit (HOSPITAL_COMMUNITY): Payer: Self-pay | Admitting: Internal Medicine

## 2020-11-02 MED FILL — MOLNUPIRAVIR 200 MG CAPS: 200 | 5 days supply | Qty: 40 | Fill #0

## 2020-11-17 DIAGNOSIS — M48062 Spinal stenosis, lumbar region with neurogenic claudication: Secondary | ICD-10-CM | POA: Diagnosis not present

## 2020-11-17 DIAGNOSIS — Z006 Encounter for examination for normal comparison and control in clinical research program: Secondary | ICD-10-CM | POA: Diagnosis not present

## 2020-12-08 DIAGNOSIS — E785 Hyperlipidemia, unspecified: Secondary | ICD-10-CM | POA: Diagnosis not present

## 2020-12-08 DIAGNOSIS — G4700 Insomnia, unspecified: Secondary | ICD-10-CM | POA: Diagnosis not present

## 2020-12-08 DIAGNOSIS — I10 Essential (primary) hypertension: Secondary | ICD-10-CM | POA: Diagnosis not present

## 2020-12-08 DIAGNOSIS — G2581 Restless legs syndrome: Secondary | ICD-10-CM | POA: Diagnosis not present

## 2020-12-08 DIAGNOSIS — K5900 Constipation, unspecified: Secondary | ICD-10-CM | POA: Diagnosis not present

## 2020-12-08 DIAGNOSIS — E669 Obesity, unspecified: Secondary | ICD-10-CM | POA: Diagnosis not present

## 2020-12-08 DIAGNOSIS — G8929 Other chronic pain: Secondary | ICD-10-CM | POA: Diagnosis not present

## 2020-12-08 DIAGNOSIS — K219 Gastro-esophageal reflux disease without esophagitis: Secondary | ICD-10-CM | POA: Diagnosis not present

## 2020-12-08 DIAGNOSIS — H3530 Unspecified macular degeneration: Secondary | ICD-10-CM | POA: Diagnosis not present

## 2020-12-21 DIAGNOSIS — M48062 Spinal stenosis, lumbar region with neurogenic claudication: Secondary | ICD-10-CM | POA: Diagnosis not present

## 2020-12-21 DIAGNOSIS — M48061 Spinal stenosis, lumbar region without neurogenic claudication: Secondary | ICD-10-CM | POA: Diagnosis not present

## 2020-12-28 DIAGNOSIS — Z961 Presence of intraocular lens: Secondary | ICD-10-CM | POA: Diagnosis not present

## 2020-12-28 DIAGNOSIS — H3321 Serous retinal detachment, right eye: Secondary | ICD-10-CM | POA: Diagnosis not present

## 2020-12-28 DIAGNOSIS — H469 Unspecified optic neuritis: Secondary | ICD-10-CM | POA: Diagnosis not present

## 2020-12-28 DIAGNOSIS — H353211 Exudative age-related macular degeneration, right eye, with active choroidal neovascularization: Secondary | ICD-10-CM | POA: Diagnosis not present

## 2020-12-28 DIAGNOSIS — Z79899 Other long term (current) drug therapy: Secondary | ICD-10-CM | POA: Diagnosis not present

## 2020-12-28 DIAGNOSIS — H35033 Hypertensive retinopathy, bilateral: Secondary | ICD-10-CM | POA: Diagnosis not present

## 2020-12-28 DIAGNOSIS — H35063 Retinal vasculitis, bilateral: Secondary | ICD-10-CM | POA: Diagnosis not present

## 2021-01-12 DIAGNOSIS — E785 Hyperlipidemia, unspecified: Secondary | ICD-10-CM | POA: Diagnosis not present

## 2021-01-12 DIAGNOSIS — I10 Essential (primary) hypertension: Secondary | ICD-10-CM | POA: Diagnosis not present

## 2021-01-12 DIAGNOSIS — M859 Disorder of bone density and structure, unspecified: Secondary | ICD-10-CM | POA: Diagnosis not present

## 2021-01-12 DIAGNOSIS — Z79899 Other long term (current) drug therapy: Secondary | ICD-10-CM | POA: Diagnosis not present

## 2021-01-12 DIAGNOSIS — D649 Anemia, unspecified: Secondary | ICD-10-CM | POA: Diagnosis not present

## 2021-01-18 DIAGNOSIS — M48062 Spinal stenosis, lumbar region with neurogenic claudication: Secondary | ICD-10-CM | POA: Diagnosis not present

## 2021-01-19 DIAGNOSIS — E669 Obesity, unspecified: Secondary | ICD-10-CM | POA: Diagnosis not present

## 2021-01-19 DIAGNOSIS — E785 Hyperlipidemia, unspecified: Secondary | ICD-10-CM | POA: Diagnosis not present

## 2021-01-19 DIAGNOSIS — I10 Essential (primary) hypertension: Secondary | ICD-10-CM | POA: Diagnosis not present

## 2021-01-19 DIAGNOSIS — Z0000 Encounter for general adult medical examination without abnormal findings: Secondary | ICD-10-CM | POA: Diagnosis not present

## 2021-01-19 DIAGNOSIS — M179 Osteoarthritis of knee, unspecified: Secondary | ICD-10-CM | POA: Diagnosis not present

## 2021-01-19 DIAGNOSIS — R82998 Other abnormal findings in urine: Secondary | ICD-10-CM | POA: Diagnosis not present

## 2021-01-19 DIAGNOSIS — I2510 Atherosclerotic heart disease of native coronary artery without angina pectoris: Secondary | ICD-10-CM | POA: Diagnosis not present

## 2021-01-19 DIAGNOSIS — G4733 Obstructive sleep apnea (adult) (pediatric): Secondary | ICD-10-CM | POA: Diagnosis not present

## 2021-01-19 DIAGNOSIS — I6529 Occlusion and stenosis of unspecified carotid artery: Secondary | ICD-10-CM | POA: Diagnosis not present

## 2021-01-19 DIAGNOSIS — G959 Disease of spinal cord, unspecified: Secondary | ICD-10-CM | POA: Diagnosis not present

## 2021-02-22 DIAGNOSIS — M48061 Spinal stenosis, lumbar region without neurogenic claudication: Secondary | ICD-10-CM | POA: Diagnosis not present

## 2021-02-22 DIAGNOSIS — M48062 Spinal stenosis, lumbar region with neurogenic claudication: Secondary | ICD-10-CM | POA: Diagnosis not present

## 2021-03-22 DIAGNOSIS — Z961 Presence of intraocular lens: Secondary | ICD-10-CM | POA: Diagnosis not present

## 2021-03-22 DIAGNOSIS — H35063 Retinal vasculitis, bilateral: Secondary | ICD-10-CM | POA: Diagnosis not present

## 2021-03-22 DIAGNOSIS — H469 Unspecified optic neuritis: Secondary | ICD-10-CM | POA: Diagnosis not present

## 2021-03-22 DIAGNOSIS — H35033 Hypertensive retinopathy, bilateral: Secondary | ICD-10-CM | POA: Diagnosis not present

## 2021-03-22 DIAGNOSIS — H3321 Serous retinal detachment, right eye: Secondary | ICD-10-CM | POA: Diagnosis not present

## 2021-03-22 DIAGNOSIS — H353211 Exudative age-related macular degeneration, right eye, with active choroidal neovascularization: Secondary | ICD-10-CM | POA: Diagnosis not present

## 2021-03-22 DIAGNOSIS — Z79899 Other long term (current) drug therapy: Secondary | ICD-10-CM | POA: Diagnosis not present

## 2021-04-22 ENCOUNTER — Other Ambulatory Visit: Payer: Self-pay

## 2021-04-22 ENCOUNTER — Encounter: Payer: Self-pay | Admitting: Interventional Cardiology

## 2021-04-22 ENCOUNTER — Ambulatory Visit: Payer: Medicare PPO | Admitting: Interventional Cardiology

## 2021-04-22 VITALS — BP 84/52 | HR 74 | Ht 69.0 in | Wt 208.8 lb

## 2021-04-22 DIAGNOSIS — R0602 Shortness of breath: Secondary | ICD-10-CM | POA: Diagnosis not present

## 2021-04-22 DIAGNOSIS — R600 Localized edema: Secondary | ICD-10-CM | POA: Diagnosis not present

## 2021-04-22 DIAGNOSIS — R072 Precordial pain: Secondary | ICD-10-CM

## 2021-04-22 DIAGNOSIS — I10 Essential (primary) hypertension: Secondary | ICD-10-CM

## 2021-04-22 MED ORDER — VALSARTAN 160 MG PO TABS: 160.0000 mg | ORAL_TABLET | Freq: Every day | ORAL | 3 refills | Status: DC

## 2021-04-22 NOTE — Progress Notes (Signed)
Cardiology Office Note   Date:  04/22/2021   ID:  [name], DOB 30-Jan-1956, MRN [id number]  PCP:  Crist Infante, MD    No chief complaint on file.  Chest pain and DOE  Wt Readings from Last 3 Encounters:  04/22/21 208 lb 12.8 oz (94.7 kg)  10/29/18 205 lb 3.2 oz (93.1 kg)  11/08/17 203 lb 1.6 oz (92.1 kg)       History of Present Illness: Cheryl ORLOSKY is a 65 y.o. female who is being seen today for the evaluation of CP and DOE at the request of Crist Infante, MD.   2017 stress test: "Nuclear stress EF: 65%. Normal wall motion There was no ST segment deviation noted during stress. Defect 1: There is a small defect of moderate severity present in the mid anteroseptal location, partially reversible. This may represent RV insertion point or a very small amount of localized ischemia possibly small diagonal branch. This is a overall a low risk study.   Advise continuing with medical therapy. There is no large area of ischemia. Overall reassuring ejection fraction. No signs of cardiomyopathy.   Candee Furbish, MD"   Carotid Doppler 2021: "Right Carotid: Velocities in the right ICA are consistent with a 1-39%  stenosis.   Left Carotid: Velocities in the left ICA are consistent with a 1-39%  stenosis.                   Elevated velocities without the presence of disease observed  in                the distal ICA.   Vertebrals:  Bilateral vertebral arteries demonstrate antegrade flow.  Subclavians: Normal flow hemodynamics were seen in bilateral subclavian               arteries. "  Father with MI and mother with stroke.     She get DOE with less activity these days. She feels her BP has gotten too low since having COVID in 10/2020.  Readings in the [id number] systolic.    She has some chest pressure as well along with the DOE.    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 She does not sleep well.    Denies :  Dizziness. Nitroglycerin use. Orthopnea. Palpitations.  Paroxysmal nocturnal dyspnea.  Syncope.    She notes leg edema, worse at the end of the day.    Past Medical History:  Diagnosis Date   Anxiety    Arthritis    Deviated septum    Dyspnea    with exertion   wheezing at night   Fibroid tumor    GERD (gastroesophageal reflux disease)    Hepatitis    AS CHILD     Hypercholesteremia    Hypertension    Memory loss    Meniscus tear    MRSA infection    PONV (postoperative nausea and vomiting)    Retinal detachment    Retinal detachment    Spinal stenosis    Urinary frequency     Past Surgical History:  Procedure Laterality Date   ANTERIOR CERVICAL DECOMP/DISCECTOMY FUSION N/A 11/02/2015   Procedure: ANTERIOR CERVICAL DECOMPRESSION FUSION CERVICAL FIVE-SIX,CERVICAL SIX-SEVEN;  Surgeon: Karie Chimera, MD;  Location: [facility];  Service: Neurosurgery;  Laterality: N/A;  right side approach   CATARACT EXTRACTION Right    fibroid tumor     KNEE SURGERY Bilateral    RETINAL DETACHMENT SURGERY Right    TOTAL  KNEE ARTHROPLASTY Right 11/08/2017   TOTAL KNEE ARTHROPLASTY Right 11/08/2017   Procedure: RIGHT TOTAL KNEE ARTHROPLASTY;  Surgeon: Leandrew Koyanagi, MD;  Location: [facility];  Service: Orthopedics;  Laterality: Right;     Current Outpatient Medications  Medication Sig Dispense Refill   acetaminophen (TYLENOL) 500 MG tablet 2 tab PO AM and 2 PO PM     amoxicillin (AMOXIL) 500 MG capsule TAKE 4 CAPSULES BY MOUTH ONCE AS A SINGLE DOSE 4 capsule 6   aspirin EC 81 MG tablet Take 81 mg by mouth daily. Swallow whole.     azaTHIOprine (IMURAN) 50 MG tablet Take 1 tablet (50 mg total) by mouth daily. (Patient taking differently: Take 50 mg by mouth at bedtime.) 90 tablet 4   calcium carbonate (OSCAL) 1500 (600 Ca) MG TABS tablet Take 1,500 mg by mouth daily.     carvedilol (COREG) 25 MG tablet TAKE 1 TABLET (25 MG TOTAL) BY MOUTH 2 (TWO) TIMES DAILY WITH A MEAL. 60 tablet 11   chlorthalidone (HYGROTON) 25 MG tablet      Cholecalciferol  (VITAMIN D) 2000 units CAPS Take 2,000 Units by mouth daily.     docusate sodium (COLACE) 100 MG capsule Take 100 mg by mouth daily.     folic acid (FOLVITE) 1 MG tablet Take 1 mg by mouth daily.      gabapentin (NEURONTIN) 300 MG capsule Take 300 mg by mouth 3 (three) times daily. Take up to 4 times per day (1200 mg)     ibuprofen (ADVIL,MOTRIN) 600 MG tablet Take 1 tablet (600 mg total) by mouth every 6 (six) hours as needed. 30 tablet 0   meloxicam (MOBIC) 7.5 MG tablet TAKE 1-2 TABLETS (7.5-15 MG TOTAL) BY MOUTH DAILY. 30 tablet 1   Molnupiravir 200 MG CAPS TAKE 4 CAPSULES BY MOUTH EVERY 12 HOURS FOR 5 DAYS AS DIRECTED 40 capsule 0   nitroGLYCERIN (NITROSTAT) 0.4 MG SL tablet Place 0.4 mg under the tongue every 5 (five) minutes as needed for chest pain.      omeprazole (PRILOSEC) 20 MG capsule Take 20 mg by mouth 2 (two) times daily before a meal.  8   OVER THE COUNTER MEDICATION Take 1 tablet by mouth daily. Vision Gold     pravastatin (PRAVACHOL) 40 MG tablet Take 40 mg by mouth daily.     rOPINIRole (REQUIP) 0.5 MG tablet      valsartan (DIOVAN) 320 MG tablet TAKE ONE EVERY EVENING AT BEDTIME     promethazine (PHENERGAN) 25 MG tablet Take 1 tablet (25 mg total) by mouth every 6 (six) hours as needed for nausea. (Patient not taking: Reported on 04/22/2021) 30 tablet 1   senna-docusate (SENOKOT S) 8.6-50 MG tablet Take 1 tablet by mouth at bedtime as needed. (Patient not taking: Reported on 04/22/2021) 30 tablet 1   tiZANidine (ZANAFLEX) 4 MG tablet Take 1 tablet (4 mg total) by mouth every 6 (six) hours as needed for muscle spasms. (Patient not taking: Reported on 04/22/2021) 30 tablet 2   tiZANidine (ZANAFLEX) 4 MG tablet Take 1 tablet (4 mg total) by mouth every 6 (six) hours as needed for muscle spasms. (Patient not taking: Reported on 04/22/2021) 30 tablet 2   No current facility-administered medications for this visit.    Allergies:   Patient has no known allergies.    Social History:   The patient  reports that she has never smoked. She has never used smokeless tobacco. She reports that she does not  drink alcohol and does not use drugs.   Family History:  The patient's family history includes Arrhythmia in her mother; Bladder Cancer in her mother; Heart attack in her father; Heart disease in her father; Stroke in her maternal grandfather, maternal grandmother, and mother; Thyroid disease in her mother.    ROS:  Please see the history of present illness.   Otherwise, review of systems are positive for DOE, chest pressure.   All other systems are reviewed and negative.    PHYSICAL EXAM: VS:  BP (!) 84/52   Pulse 74   Ht '5\' 9"'$  (1.753 m)   Wt 208 lb 12.8 oz (94.7 kg)   SpO2 96%   BMI 30.83 kg/m  , BMI Body mass index is 30.83 kg/m. GEN: Well nourished, well developed, in no acute distress HEENT: normal Neck: no JVD, carotid bruits, or masses Cardiac: RRR; no murmurs, rubs, or gallops,tr LE edema  Respiratory:  clear to auscultation bilaterally, normal work of breathing GI: soft, nontender, nondistended, + BS MS: no deformity or atrophy Skin: warm and dry, no rash Neuro:  Strength and sensation are intact Psych: euthymic mood, full affect   EKG:   The ekg ordered today demonstrates normal sinus rhythm, nonspecific ST changes inferiorly   Recent Labs: No results found for requested labs within last 8760 hours.   Lipid Panel No results found for: CHOL, TRIG, HDL, CHOLHDL, VLDL, LDLCALC, LDLDIRECT   Other studies Reviewed: Additional studies/ records that were reviewed today with results demonstrating: Labs reviewed. LDL 125.  Hbg > 13 in 4/22.    ASSESSMENT AND PLAN:  Precordial Chest pain: Some risk factors for heart disease including family history.  We will check coronary CTA to evaluate for obstructive CAD. DOE: Likely multifactorial.  Obesity, lack of activity due to back pain may also be contributing.  Will need to rule out ischemia.  Check BNP to look  for volume overload.  Hypotension/HTN: 98/52 on my check.  Will decrease valsartan to 160 mg daily.  Continue other meds for now.  If her blood pressure stay in the same range at home, may be able to further cut the dosages.  We will continue carvedilol given her coronary CT coming up. Leg edema: Elevate legs. Now taking chlorthalidone, but she has not noticed improvement in the edema   Current medicines are reviewed at length with the patient today.  The patient concerns regarding her medicines were addressed.  The following changes have been made: Decrease valsartan  Labs/ tests ordered today include: BNP No orders of the defined types were placed in this encounter.   Recommend 150 minutes/week of aerobic exercise Low fat, low carb, high fiber diet recommended  Disposition:   FU in for CTA   Signed, Larae Grooms, MD  04/22/2021 2:31 PM    [facility] [place], [place], [place]  [address] Phone: [phone]; Fax: [phone]

## 2021-04-22 NOTE — Patient Instructions (Signed)
Medication Instructions:  Your physician has recommended you make the following change in your medication: Decrease Valsartan to 160 mg by mouth daily  *If you need a refill on your cardiac medications before your next appointment, please call your pharmacy*   Lab Work: Lab work to be done today--BMP, CBC, BNP If you have labs (blood work) drawn today and your tests are completely normal, you will receive your results only by: [place] (if you have MyChart) OR A paper copy in the mail If you have any lab test that is abnormal or we need to change your treatment, we will call you to review the results.   Testing/Procedures: Your physician has requested that you have cardiac CT. Cardiac computed tomography (CT) is a painless test that uses an x-ray machine to take clear, detailed pictures of your heart. For further information please visit HugeFiesta.tn. Please follow instruction sheet as given.     Follow-Up: At [facility], you and your health needs are our priority.  As part of our continuing mission to provide you with exceptional heart care, we have created designated Provider Care Teams.  These Care Teams include your primary Cardiologist (physician) and Advanced Practice Providers (APPs -  Physician Assistants and Nurse Practitioners) who all work together to provide you with the care you need, when you need it.  We recommend signing up for the patient portal called "MyChart".  Sign up information is provided on this After Visit Summary.  MyChart is used to connect with patients for Virtual Visits (Telemedicine).  Patients are able to view lab/test results, encounter notes, upcoming appointments, etc.  Non-urgent messages can be sent to your provider as well.   To learn more about what you can do with MyChart, go to [email or web address].    Your next appointment:   Based on test results  The format for your next appointment:   In Person  Provider:   You may  see Larae Grooms, MD or one of the following Advanced Practice Providers on your designated Care Team:   Melina Copa, PA-C Ermalinda Barrios, PA-C   Other Instructions   Your cardiac CT will be scheduled at one of the below locations:   [facility] [address] [place], [place] [address] [phone]  [place] [address] [place], [place] [address] [phone]  If scheduled at [facility], please arrive at the [facility] main entrance (entrance A) of [facility] 30 minutes prior to test start time. Proceed to the [facility] Radiology Department (first floor) to check-in and test prep.  If scheduled at [facility], please arrive 15 mins early for check-in and test prep.  Please follow these instructions carefully (unless otherwise directed):    On the Night Before the Test: Be sure to Drink plenty of water. Do not consume any caffeinated/decaffeinated beverages or chocolate 12 hours prior to your test. Do not take any antihistamines 12 hours prior to your test.  On the Day of the Test: Drink plenty of water until 1 hour prior to the test. Do not eat any food 4 hours prior to the test. You may take your regular medications prior to the test.  Take morning dose of Carvedilol two hours prior to test. HOLD Furosemide/Hydrochlorothiazide morning of the test. FEMALES- please wear underwire-free bra if available, avoid dresses & tight clothing        After the Test: Drink plenty of water.  After receiving IV contrast, you may experience a mild flushed feeling. This is normal. On occasion, you may experience a mild rash up to 24 hours after the test. This is not dangerous. If this occurs, you can take Benadryl 25 mg and increase your fluid intake. If you experience trouble breathing, this can be serious. If it is severe call 911 IMMEDIATELY. If it is mild, please  call our office. If you take any of these medications: Glipizide/Metformin, Avandament, Glucavance, please do not take 48 hours after completing test unless otherwise instructed.  Please allow 2-4 weeks for scheduling of routine cardiac CTs. Some insurance companies require a pre-authorization which may delay scheduling of this test.   For non-scheduling related questions, please contact the cardiac imaging nurse navigator should you have any questions/concerns: Marchia Bond, Cardiac Imaging Nurse Navigator Gordy Clement, Cardiac Imaging Nurse Navigator [facility] Direct Office Dial: [phone]   For scheduling needs, including cancellations and rescheduling, please call [place], [phone].

## 2021-04-23 LAB — BASIC METABOLIC PANEL
BUN/Creatinine Ratio: 21 (ref 12–28)
BUN: 42 mg/dL — ABNORMAL HIGH (ref 8–27)
CO2: 25 mmol/L (ref 20–29)
Calcium: 9.3 mg/dL (ref 8.7–10.3)
Chloride: 104 mmol/L (ref 96–106)
Creatinine, Ser: 2.04 mg/dL — ABNORMAL HIGH (ref 0.57–1.00)
Glucose: 92 mg/dL (ref 65–99)
Potassium: 4.2 mmol/L (ref 3.5–5.2)
Sodium: 144 mmol/L (ref 134–144)
eGFR: 26 mL/min/{1.73_m2} — ABNORMAL LOW (ref >59)

## 2021-04-23 LAB — PRO B NATRIURETIC PEPTIDE: NT-Pro BNP: 133 pg/mL (ref 0–301)

## 2021-04-23 LAB — CBC
Hematocrit: 37.9 % (ref 34.0–46.6)
Hemoglobin: 12.9 g/dL (ref 11.1–15.9)
MCH: 32.3 pg (ref 26.6–33.0)
MCHC: 34 g/dL (ref 31.5–35.7)
MCV: 95 fL (ref 79–97)
Platelets: 242 10*3/uL (ref 150–450)
RBC: 4 x10E6/uL (ref 3.77–5.28)
RDW: 12.8 % (ref 11.7–15.4)
WBC: 8.9 10*3/uL (ref 3.4–10.8)

## 2021-04-26 DIAGNOSIS — M48062 Spinal stenosis, lumbar region with neurogenic claudication: Secondary | ICD-10-CM | POA: Diagnosis not present

## 2021-05-02 ENCOUNTER — Telehealth: Payer: Self-pay | Admitting: *Deleted

## 2021-05-02 DIAGNOSIS — I10 Essential (primary) hypertension: Secondary | ICD-10-CM

## 2021-05-02 DIAGNOSIS — R7989 Other specified abnormal findings of blood chemistry: Secondary | ICD-10-CM

## 2021-05-02 NOTE — Telephone Encounter (Signed)
-----   Message from Jettie Booze, MD sent at 04/30/2021  5:02 PM EDT ----- Cr was normal in 4/22.  Stop valvsaratan completely. Normal BNP.  Stop clorthalidone as well. Recheck BMet in a week.

## 2021-05-02 NOTE — Telephone Encounter (Signed)
In addition to changes below Dr Irish Lack would like to check BMP today

## 2021-05-02 NOTE — Telephone Encounter (Signed)
I spoke with patient and reviewed lab results and instructions from Dr Irish Lack with her.  She is unable to come in for lab work today.  She will come in for [facility] tomorrow and on 8/16. Patient reports she has been under a great deal of stress recently. She reports BP is around [id number] systolic.  She feels woozy at times.  Patient instructed to stay hydrated and change positions slowly.  Patient does not know exact BP readings.  She will record BP readings from machine and drop off in office when here for lab work

## 2021-05-03 ENCOUNTER — Other Ambulatory Visit: Payer: Medicare PPO

## 2021-05-03 ENCOUNTER — Other Ambulatory Visit: Payer: Self-pay

## 2021-05-03 DIAGNOSIS — I10 Essential (primary) hypertension: Secondary | ICD-10-CM | POA: Diagnosis not present

## 2021-05-03 DIAGNOSIS — R7989 Other specified abnormal findings of blood chemistry: Secondary | ICD-10-CM | POA: Diagnosis not present

## 2021-05-03 LAB — BASIC METABOLIC PANEL
BUN/Creatinine Ratio: 37 — ABNORMAL HIGH (ref 12–28)
BUN: 47 mg/dL — ABNORMAL HIGH (ref 8–27)
CO2: 24 mmol/L (ref 20–29)
Calcium: 9.4 mg/dL (ref 8.7–10.3)
Chloride: 101 mmol/L (ref 96–106)
Creatinine, Ser: 1.26 mg/dL — ABNORMAL HIGH (ref 0.57–1.00)
Glucose: 109 mg/dL — ABNORMAL HIGH (ref 65–99)
Potassium: 4.4 mmol/L (ref 3.5–5.2)
Sodium: 139 mmol/L (ref 134–144)
eGFR: 47 mL/min/{1.73_m2} — ABNORMAL LOW (ref >59)

## 2021-05-10 ENCOUNTER — Other Ambulatory Visit: Payer: Medicare PPO | Admitting: *Deleted

## 2021-05-10 ENCOUNTER — Other Ambulatory Visit: Payer: Self-pay

## 2021-05-10 DIAGNOSIS — I10 Essential (primary) hypertension: Secondary | ICD-10-CM | POA: Diagnosis not present

## 2021-05-10 DIAGNOSIS — R7989 Other specified abnormal findings of blood chemistry: Secondary | ICD-10-CM

## 2021-05-11 LAB — BASIC METABOLIC PANEL
BUN/Creatinine Ratio: 33 — ABNORMAL HIGH (ref 12–28)
BUN: 27 mg/dL (ref 8–27)
CO2: 23 mmol/L (ref 20–29)
Calcium: 9.2 mg/dL (ref 8.7–10.3)
Chloride: 105 mmol/L (ref 96–106)
Creatinine, Ser: 0.83 mg/dL (ref 0.57–1.00)
Glucose: 97 mg/dL (ref 65–99)
Potassium: 3.8 mmol/L (ref 3.5–5.2)
Sodium: 142 mmol/L (ref 134–144)
eGFR: 77 mL/min/{1.73_m2} (ref >59)

## 2021-05-17 DIAGNOSIS — M48061 Spinal stenosis, lumbar region without neurogenic claudication: Secondary | ICD-10-CM | POA: Diagnosis not present

## 2021-05-17 DIAGNOSIS — I10 Essential (primary) hypertension: Secondary | ICD-10-CM | POA: Diagnosis not present

## 2021-05-17 DIAGNOSIS — Z6834 Body mass index (BMI) 34.0-34.9, adult: Secondary | ICD-10-CM | POA: Diagnosis not present

## 2021-05-17 DIAGNOSIS — M48062 Spinal stenosis, lumbar region with neurogenic claudication: Secondary | ICD-10-CM | POA: Diagnosis not present

## 2021-05-25 DIAGNOSIS — I10 Essential (primary) hypertension: Secondary | ICD-10-CM

## 2021-05-27 NOTE — Telephone Encounter (Signed)
Can add back chlorthalidone at prior dose.  BMet in one week.   JV

## 2021-05-31 DIAGNOSIS — J189 Pneumonia, unspecified organism: Secondary | ICD-10-CM | POA: Insufficient documentation

## 2021-06-02 ENCOUNTER — Telehealth: Payer: Self-pay | Admitting: Interventional Cardiology

## 2021-06-02 ENCOUNTER — Other Ambulatory Visit: Payer: Medicare PPO

## 2021-06-02 DIAGNOSIS — I10 Essential (primary) hypertension: Secondary | ICD-10-CM | POA: Diagnosis not present

## 2021-06-02 DIAGNOSIS — G4733 Obstructive sleep apnea (adult) (pediatric): Secondary | ICD-10-CM | POA: Diagnosis not present

## 2021-06-02 DIAGNOSIS — R0602 Shortness of breath: Secondary | ICD-10-CM | POA: Diagnosis not present

## 2021-06-02 DIAGNOSIS — J189 Pneumonia, unspecified organism: Secondary | ICD-10-CM | POA: Diagnosis not present

## 2021-06-02 DIAGNOSIS — I2510 Atherosclerotic heart disease of native coronary artery without angina pectoris: Secondary | ICD-10-CM | POA: Diagnosis not present

## 2021-06-02 DIAGNOSIS — I739 Peripheral vascular disease, unspecified: Secondary | ICD-10-CM | POA: Diagnosis not present

## 2021-06-02 NOTE — Telephone Encounter (Signed)
Fax number confirmed with office.  Last office note and labs from 7/29, 8/9 and 8/16 faxed.

## 2021-06-02 NOTE — Telephone Encounter (Signed)
Patient is at Dr. Silvestre Mesi office now and needs office visit notes and recent labs sent to the office.  Please fax to [phone]

## 2021-06-03 ENCOUNTER — Other Ambulatory Visit: Payer: Medicare PPO

## 2021-06-07 ENCOUNTER — Other Ambulatory Visit: Payer: Self-pay

## 2021-06-07 ENCOUNTER — Other Ambulatory Visit: Payer: Medicare PPO | Admitting: *Deleted

## 2021-06-07 DIAGNOSIS — I10 Essential (primary) hypertension: Secondary | ICD-10-CM

## 2021-06-07 LAB — BASIC METABOLIC PANEL
BUN/Creatinine Ratio: 27 (ref 12–28)
BUN: 31 mg/dL — ABNORMAL HIGH (ref 8–27)
CO2: 25 mmol/L (ref 20–29)
Calcium: 9.9 mg/dL (ref 8.7–10.3)
Chloride: 99 mmol/L (ref 96–106)
Creatinine, Ser: 1.13 mg/dL — ABNORMAL HIGH (ref 0.57–1.00)
Glucose: 127 mg/dL — ABNORMAL HIGH (ref 65–99)
Potassium: 3.8 mmol/L (ref 3.5–5.2)
Sodium: 138 mmol/L (ref 134–144)
eGFR: 53 mL/min/{1.73_m2} — ABNORMAL LOW (ref >59)

## 2021-06-14 DIAGNOSIS — Z961 Presence of intraocular lens: Secondary | ICD-10-CM | POA: Diagnosis not present

## 2021-06-14 DIAGNOSIS — H3321 Serous retinal detachment, right eye: Secondary | ICD-10-CM | POA: Diagnosis not present

## 2021-06-14 DIAGNOSIS — H469 Unspecified optic neuritis: Secondary | ICD-10-CM | POA: Diagnosis not present

## 2021-06-14 DIAGNOSIS — H35033 Hypertensive retinopathy, bilateral: Secondary | ICD-10-CM | POA: Diagnosis not present

## 2021-06-14 DIAGNOSIS — H353211 Exudative age-related macular degeneration, right eye, with active choroidal neovascularization: Secondary | ICD-10-CM | POA: Diagnosis not present

## 2021-06-14 DIAGNOSIS — Z79899 Other long term (current) drug therapy: Secondary | ICD-10-CM | POA: Diagnosis not present

## 2021-06-14 DIAGNOSIS — H35063 Retinal vasculitis, bilateral: Secondary | ICD-10-CM | POA: Diagnosis not present

## 2021-07-26 ENCOUNTER — Other Ambulatory Visit: Payer: Self-pay | Admitting: Internal Medicine

## 2021-07-26 DIAGNOSIS — R7301 Impaired fasting glucose: Secondary | ICD-10-CM | POA: Diagnosis not present

## 2021-07-26 DIAGNOSIS — E785 Hyperlipidemia, unspecified: Secondary | ICD-10-CM | POA: Diagnosis not present

## 2021-07-26 DIAGNOSIS — G2581 Restless legs syndrome: Secondary | ICD-10-CM | POA: Diagnosis not present

## 2021-07-26 DIAGNOSIS — I10 Essential (primary) hypertension: Secondary | ICD-10-CM | POA: Diagnosis not present

## 2021-07-26 DIAGNOSIS — M8589 Other specified disorders of bone density and structure, multiple sites: Secondary | ICD-10-CM | POA: Diagnosis not present

## 2021-07-26 DIAGNOSIS — G4733 Obstructive sleep apnea (adult) (pediatric): Secondary | ICD-10-CM | POA: Diagnosis not present

## 2021-07-26 DIAGNOSIS — G959 Disease of spinal cord, unspecified: Secondary | ICD-10-CM | POA: Diagnosis not present

## 2021-07-26 DIAGNOSIS — Z1231 Encounter for screening mammogram for malignant neoplasm of breast: Secondary | ICD-10-CM

## 2021-07-26 DIAGNOSIS — I2510 Atherosclerotic heart disease of native coronary artery without angina pectoris: Secondary | ICD-10-CM | POA: Diagnosis not present

## 2021-07-26 DIAGNOSIS — Z23 Encounter for immunization: Secondary | ICD-10-CM | POA: Diagnosis not present

## 2021-07-26 DIAGNOSIS — I739 Peripheral vascular disease, unspecified: Secondary | ICD-10-CM | POA: Diagnosis not present

## 2021-08-09 DIAGNOSIS — Z961 Presence of intraocular lens: Secondary | ICD-10-CM | POA: Diagnosis not present

## 2021-08-09 DIAGNOSIS — I10 Essential (primary) hypertension: Secondary | ICD-10-CM | POA: Diagnosis not present

## 2021-08-09 DIAGNOSIS — Z79899 Other long term (current) drug therapy: Secondary | ICD-10-CM | POA: Diagnosis not present

## 2021-08-09 DIAGNOSIS — Z8669 Personal history of other diseases of the nervous system and sense organs: Secondary | ICD-10-CM | POA: Diagnosis not present

## 2021-08-09 DIAGNOSIS — H469 Unspecified optic neuritis: Secondary | ICD-10-CM | POA: Diagnosis not present

## 2021-08-09 DIAGNOSIS — M48062 Spinal stenosis, lumbar region with neurogenic claudication: Secondary | ICD-10-CM | POA: Diagnosis not present

## 2021-08-09 DIAGNOSIS — H35033 Hypertensive retinopathy, bilateral: Secondary | ICD-10-CM | POA: Diagnosis not present

## 2021-08-09 DIAGNOSIS — H353211 Exudative age-related macular degeneration, right eye, with active choroidal neovascularization: Secondary | ICD-10-CM | POA: Diagnosis not present

## 2021-08-09 DIAGNOSIS — H35063 Retinal vasculitis, bilateral: Secondary | ICD-10-CM | POA: Diagnosis not present

## 2021-08-30 ENCOUNTER — Ambulatory Visit
Admission: RE | Admit: 2021-08-30 | Discharge: 2021-08-30 | Disposition: A | Discharge status: Home / Self Care | Payer: Medicare PPO | Source: Ambulatory Visit | Attending: Internal Medicine | Admitting: Internal Medicine

## 2021-08-30 ENCOUNTER — Other Ambulatory Visit: Payer: Self-pay

## 2021-08-30 DIAGNOSIS — Z1231 Encounter for screening mammogram for malignant neoplasm of breast: Secondary | ICD-10-CM | POA: Diagnosis not present

## 2021-09-06 DIAGNOSIS — Z961 Presence of intraocular lens: Secondary | ICD-10-CM | POA: Diagnosis not present

## 2021-09-06 DIAGNOSIS — H35033 Hypertensive retinopathy, bilateral: Secondary | ICD-10-CM | POA: Diagnosis not present

## 2021-09-06 DIAGNOSIS — H3321 Serous retinal detachment, right eye: Secondary | ICD-10-CM | POA: Diagnosis not present

## 2021-09-06 DIAGNOSIS — H35063 Retinal vasculitis, bilateral: Secondary | ICD-10-CM | POA: Diagnosis not present

## 2021-09-06 DIAGNOSIS — H469 Unspecified optic neuritis: Secondary | ICD-10-CM | POA: Diagnosis not present

## 2021-09-06 DIAGNOSIS — H353211 Exudative age-related macular degeneration, right eye, with active choroidal neovascularization: Secondary | ICD-10-CM | POA: Diagnosis not present

## 2021-09-06 DIAGNOSIS — Z79899 Other long term (current) drug therapy: Secondary | ICD-10-CM | POA: Diagnosis not present

## 2021-10-04 DIAGNOSIS — Z961 Presence of intraocular lens: Secondary | ICD-10-CM | POA: Diagnosis not present

## 2021-10-04 DIAGNOSIS — H35063 Retinal vasculitis, bilateral: Secondary | ICD-10-CM | POA: Diagnosis not present

## 2021-10-04 DIAGNOSIS — H35033 Hypertensive retinopathy, bilateral: Secondary | ICD-10-CM | POA: Diagnosis not present

## 2021-10-04 DIAGNOSIS — H469 Unspecified optic neuritis: Secondary | ICD-10-CM | POA: Diagnosis not present

## 2021-10-04 DIAGNOSIS — H3321 Serous retinal detachment, right eye: Secondary | ICD-10-CM | POA: Diagnosis not present

## 2021-10-04 DIAGNOSIS — H353211 Exudative age-related macular degeneration, right eye, with active choroidal neovascularization: Secondary | ICD-10-CM | POA: Diagnosis not present

## 2021-10-04 DIAGNOSIS — Z79899 Other long term (current) drug therapy: Secondary | ICD-10-CM | POA: Diagnosis not present

## 2021-10-11 DIAGNOSIS — H59811 Chorioretinal scars after surgery for detachment, right eye: Secondary | ICD-10-CM | POA: Diagnosis not present

## 2021-10-11 DIAGNOSIS — H43813 Vitreous degeneration, bilateral: Secondary | ICD-10-CM | POA: Diagnosis not present

## 2021-11-01 DIAGNOSIS — H35033 Hypertensive retinopathy, bilateral: Secondary | ICD-10-CM | POA: Diagnosis not present

## 2021-11-01 DIAGNOSIS — H469 Unspecified optic neuritis: Secondary | ICD-10-CM | POA: Diagnosis not present

## 2021-11-01 DIAGNOSIS — I10 Essential (primary) hypertension: Secondary | ICD-10-CM | POA: Diagnosis not present

## 2021-11-01 DIAGNOSIS — Z79899 Other long term (current) drug therapy: Secondary | ICD-10-CM | POA: Diagnosis not present

## 2021-11-01 DIAGNOSIS — H35063 Retinal vasculitis, bilateral: Secondary | ICD-10-CM | POA: Diagnosis not present

## 2021-11-01 DIAGNOSIS — H353211 Exudative age-related macular degeneration, right eye, with active choroidal neovascularization: Secondary | ICD-10-CM | POA: Diagnosis not present

## 2021-11-01 DIAGNOSIS — Z961 Presence of intraocular lens: Secondary | ICD-10-CM | POA: Diagnosis not present

## 2021-11-01 DIAGNOSIS — H3321 Serous retinal detachment, right eye: Secondary | ICD-10-CM | POA: Diagnosis not present

## 2021-11-08 DIAGNOSIS — H26492 Other secondary cataract, left eye: Secondary | ICD-10-CM | POA: Diagnosis not present

## 2021-11-08 DIAGNOSIS — H16223 Keratoconjunctivitis sicca, not specified as Sjogren's, bilateral: Secondary | ICD-10-CM | POA: Diagnosis not present

## 2021-11-08 DIAGNOSIS — H43812 Vitreous degeneration, left eye: Secondary | ICD-10-CM | POA: Diagnosis not present

## 2021-11-08 DIAGNOSIS — H469 Unspecified optic neuritis: Secondary | ICD-10-CM | POA: Diagnosis not present

## 2021-11-08 DIAGNOSIS — Z961 Presence of intraocular lens: Secondary | ICD-10-CM | POA: Diagnosis not present

## 2021-11-08 DIAGNOSIS — H353111 Nonexudative age-related macular degeneration, right eye, early dry stage: Secondary | ICD-10-CM | POA: Diagnosis not present

## 2021-11-08 DIAGNOSIS — H33001 Unspecified retinal detachment with retinal break, right eye: Secondary | ICD-10-CM | POA: Diagnosis not present

## 2021-11-17 DIAGNOSIS — M5416 Radiculopathy, lumbar region: Secondary | ICD-10-CM | POA: Diagnosis not present

## 2021-11-21 DIAGNOSIS — Z961 Presence of intraocular lens: Secondary | ICD-10-CM | POA: Diagnosis not present

## 2021-11-29 DIAGNOSIS — Z961 Presence of intraocular lens: Secondary | ICD-10-CM | POA: Diagnosis not present

## 2021-11-29 DIAGNOSIS — I10 Essential (primary) hypertension: Secondary | ICD-10-CM | POA: Diagnosis not present

## 2021-11-29 DIAGNOSIS — Z79899 Other long term (current) drug therapy: Secondary | ICD-10-CM | POA: Diagnosis not present

## 2021-11-29 DIAGNOSIS — H35063 Retinal vasculitis, bilateral: Secondary | ICD-10-CM | POA: Diagnosis not present

## 2021-11-29 DIAGNOSIS — H469 Unspecified optic neuritis: Secondary | ICD-10-CM | POA: Diagnosis not present

## 2021-11-29 DIAGNOSIS — H353211 Exudative age-related macular degeneration, right eye, with active choroidal neovascularization: Secondary | ICD-10-CM | POA: Diagnosis not present

## 2021-11-29 DIAGNOSIS — H35033 Hypertensive retinopathy, bilateral: Secondary | ICD-10-CM | POA: Diagnosis not present

## 2021-11-29 DIAGNOSIS — H3321 Serous retinal detachment, right eye: Secondary | ICD-10-CM | POA: Diagnosis not present

## 2022-01-03 DIAGNOSIS — H35063 Retinal vasculitis, bilateral: Secondary | ICD-10-CM | POA: Diagnosis not present

## 2022-01-03 DIAGNOSIS — H3321 Serous retinal detachment, right eye: Secondary | ICD-10-CM | POA: Diagnosis not present

## 2022-01-03 DIAGNOSIS — H353211 Exudative age-related macular degeneration, right eye, with active choroidal neovascularization: Secondary | ICD-10-CM | POA: Diagnosis not present

## 2022-01-03 DIAGNOSIS — H469 Unspecified optic neuritis: Secondary | ICD-10-CM | POA: Diagnosis not present

## 2022-01-03 DIAGNOSIS — Z79899 Other long term (current) drug therapy: Secondary | ICD-10-CM | POA: Diagnosis not present

## 2022-01-03 DIAGNOSIS — H35033 Hypertensive retinopathy, bilateral: Secondary | ICD-10-CM | POA: Diagnosis not present

## 2022-01-03 DIAGNOSIS — Z961 Presence of intraocular lens: Secondary | ICD-10-CM | POA: Diagnosis not present

## 2022-02-07 DIAGNOSIS — Z79899 Other long term (current) drug therapy: Secondary | ICD-10-CM | POA: Diagnosis not present

## 2022-02-07 DIAGNOSIS — M859 Disorder of bone density and structure, unspecified: Secondary | ICD-10-CM | POA: Diagnosis not present

## 2022-02-07 DIAGNOSIS — H35033 Hypertensive retinopathy, bilateral: Secondary | ICD-10-CM | POA: Diagnosis not present

## 2022-02-07 DIAGNOSIS — H3321 Serous retinal detachment, right eye: Secondary | ICD-10-CM | POA: Diagnosis not present

## 2022-02-07 DIAGNOSIS — E785 Hyperlipidemia, unspecified: Secondary | ICD-10-CM | POA: Diagnosis not present

## 2022-02-07 DIAGNOSIS — H353211 Exudative age-related macular degeneration, right eye, with active choroidal neovascularization: Secondary | ICD-10-CM | POA: Diagnosis not present

## 2022-02-07 DIAGNOSIS — H35063 Retinal vasculitis, bilateral: Secondary | ICD-10-CM | POA: Diagnosis not present

## 2022-02-07 DIAGNOSIS — H469 Unspecified optic neuritis: Secondary | ICD-10-CM | POA: Diagnosis not present

## 2022-02-07 DIAGNOSIS — Z961 Presence of intraocular lens: Secondary | ICD-10-CM | POA: Diagnosis not present

## 2022-02-07 DIAGNOSIS — R7301 Impaired fasting glucose: Secondary | ICD-10-CM | POA: Diagnosis not present

## 2022-02-07 DIAGNOSIS — I10 Essential (primary) hypertension: Secondary | ICD-10-CM | POA: Diagnosis not present

## 2022-02-14 DIAGNOSIS — I2510 Atherosclerotic heart disease of native coronary artery without angina pectoris: Secondary | ICD-10-CM | POA: Diagnosis not present

## 2022-02-14 DIAGNOSIS — I10 Essential (primary) hypertension: Secondary | ICD-10-CM | POA: Diagnosis not present

## 2022-02-14 DIAGNOSIS — M179 Osteoarthritis of knee, unspecified: Secondary | ICD-10-CM | POA: Diagnosis not present

## 2022-02-14 DIAGNOSIS — I6529 Occlusion and stenosis of unspecified carotid artery: Secondary | ICD-10-CM | POA: Diagnosis not present

## 2022-02-14 DIAGNOSIS — I739 Peripheral vascular disease, unspecified: Secondary | ICD-10-CM | POA: Diagnosis not present

## 2022-02-14 DIAGNOSIS — G2581 Restless legs syndrome: Secondary | ICD-10-CM | POA: Diagnosis not present

## 2022-02-14 DIAGNOSIS — R82998 Other abnormal findings in urine: Secondary | ICD-10-CM | POA: Diagnosis not present

## 2022-02-14 DIAGNOSIS — M1990 Unspecified osteoarthritis, unspecified site: Secondary | ICD-10-CM | POA: Diagnosis not present

## 2022-02-14 DIAGNOSIS — Z1339 Encounter for screening examination for other mental health and behavioral disorders: Secondary | ICD-10-CM | POA: Diagnosis not present

## 2022-02-14 DIAGNOSIS — E785 Hyperlipidemia, unspecified: Secondary | ICD-10-CM | POA: Diagnosis not present

## 2022-02-14 DIAGNOSIS — Z1331 Encounter for screening for depression: Secondary | ICD-10-CM | POA: Diagnosis not present

## 2022-02-14 DIAGNOSIS — Z0000 Encounter for general adult medical examination without abnormal findings: Secondary | ICD-10-CM | POA: Diagnosis not present

## 2022-02-27 DIAGNOSIS — I2510 Atherosclerotic heart disease of native coronary artery without angina pectoris: Secondary | ICD-10-CM | POA: Diagnosis not present

## 2022-02-27 DIAGNOSIS — R600 Localized edema: Secondary | ICD-10-CM | POA: Diagnosis not present

## 2022-02-27 DIAGNOSIS — I10 Essential (primary) hypertension: Secondary | ICD-10-CM | POA: Diagnosis not present

## 2022-02-27 DIAGNOSIS — I739 Peripheral vascular disease, unspecified: Secondary | ICD-10-CM | POA: Diagnosis not present

## 2022-02-27 DIAGNOSIS — E669 Obesity, unspecified: Secondary | ICD-10-CM | POA: Diagnosis not present

## 2022-02-27 DIAGNOSIS — G2581 Restless legs syndrome: Secondary | ICD-10-CM | POA: Diagnosis not present

## 2022-02-27 DIAGNOSIS — M48061 Spinal stenosis, lumbar region without neurogenic claudication: Secondary | ICD-10-CM | POA: Diagnosis not present

## 2022-02-27 DIAGNOSIS — H8110 Benign paroxysmal vertigo, unspecified ear: Secondary | ICD-10-CM | POA: Diagnosis not present

## 2022-03-08 ENCOUNTER — Other Ambulatory Visit: Payer: Self-pay | Admitting: Internal Medicine

## 2022-03-08 DIAGNOSIS — I2510 Atherosclerotic heart disease of native coronary artery without angina pectoris: Secondary | ICD-10-CM

## 2022-03-08 DIAGNOSIS — R0602 Shortness of breath: Secondary | ICD-10-CM

## 2022-03-14 DIAGNOSIS — H353211 Exudative age-related macular degeneration, right eye, with active choroidal neovascularization: Secondary | ICD-10-CM | POA: Diagnosis not present

## 2022-03-14 DIAGNOSIS — Z79899 Other long term (current) drug therapy: Secondary | ICD-10-CM | POA: Diagnosis not present

## 2022-03-14 DIAGNOSIS — H3321 Serous retinal detachment, right eye: Secondary | ICD-10-CM | POA: Diagnosis not present

## 2022-03-14 DIAGNOSIS — Z961 Presence of intraocular lens: Secondary | ICD-10-CM | POA: Diagnosis not present

## 2022-03-14 DIAGNOSIS — H469 Unspecified optic neuritis: Secondary | ICD-10-CM | POA: Diagnosis not present

## 2022-03-14 DIAGNOSIS — H35063 Retinal vasculitis, bilateral: Secondary | ICD-10-CM | POA: Diagnosis not present

## 2022-03-14 DIAGNOSIS — H35033 Hypertensive retinopathy, bilateral: Secondary | ICD-10-CM | POA: Diagnosis not present

## 2022-03-16 DIAGNOSIS — M5416 Radiculopathy, lumbar region: Secondary | ICD-10-CM | POA: Diagnosis not present

## 2022-03-21 DIAGNOSIS — H8110 Benign paroxysmal vertigo, unspecified ear: Secondary | ICD-10-CM | POA: Diagnosis not present

## 2022-03-21 DIAGNOSIS — I10 Essential (primary) hypertension: Secondary | ICD-10-CM | POA: Diagnosis not present

## 2022-03-21 DIAGNOSIS — R600 Localized edema: Secondary | ICD-10-CM | POA: Diagnosis not present

## 2022-03-21 DIAGNOSIS — I739 Peripheral vascular disease, unspecified: Secondary | ICD-10-CM | POA: Diagnosis not present

## 2022-03-21 DIAGNOSIS — I2510 Atherosclerotic heart disease of native coronary artery without angina pectoris: Secondary | ICD-10-CM | POA: Diagnosis not present

## 2022-04-10 NOTE — Progress Notes (Unsigned)
Office Visit    Patient Name: [name] Date of Encounter: 04/11/2022  PCP:  Crist Infante, MD   [place]  Cardiologist:  Larae Grooms, MD  Advanced Practice Provider:  No care team member to display Electrophysiologist:  None   HPI    [name] is a 66 y.o. female with a hx of hypertension, hypercholesterolemia, anxiety, and GERD presents today for follow-up.   She was last seen 04/22/2021.  Her history includes a stress test in 2017 with normal ejection fraction, normal wall motion, small defect of moderate severity present in the mid anterior septal location partially reversible.  This is thought to represent RV insertion point or a very small amount of localized ischemia possibly a small diagonal branch.  She was having some DOE with less activity.  She was also feeling her blood pressure was getting too low with readings in the 65K systolic.  Valsartan was decreased.  Today, she feels pretty good.  She brings in a blood pressure log and has noticed some lower blood pressures with the systolic in the 35W.  This is in particularly when she is standing.  She has 5 instances where her blood pressure from sitting to standing drops more than 20 mmHg.  This is indicative of orthostatic hypotension.  We discussed getting enough fluid throughout the day and drinking at least 64 ounces of water.  We also discussed her previously scheduled coronary CT scan that Dr. Irish Lack wanted to perform last year.  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  She does have a strong family history of coronary artery disease and would like to know whether or not she has it.  She also has had issues with lower extremity edema but this has improved.  She has not tolerated compression hose in the past.  She was recently started on Zetia in addition to her statin for better cholesterol control.  We will try to obtain her last lipid panel through her  PCP.  Reports no chest pain, pressure, or tightness. No edema, orthopnea, PND. Reports no palpitations.    Past Medical History    Past Medical History:  Diagnosis Date   Anxiety    Arthritis    Deviated septum    Dyspnea    with exertion   wheezing at night   Fibroid tumor    GERD (gastroesophageal reflux disease)    Hepatitis    AS CHILD     Hypercholesteremia    Hypertension    Memory loss    Meniscus tear    MRSA infection    PONV (postoperative nausea and vomiting)    Retinal detachment    Retinal detachment    Spinal stenosis    Urinary frequency    Past Surgical History:  Procedure Laterality Date   ANTERIOR CERVICAL DECOMP/DISCECTOMY FUSION N/A 11/02/2015   Procedure: ANTERIOR CERVICAL DECOMPRESSION FUSION CERVICAL FIVE-SIX,CERVICAL SIX-SEVEN;  Surgeon: Karie Chimera, MD;  Location: [facility];  Service: Neurosurgery;  Laterality: N/A;  right side approach   CATARACT EXTRACTION Right    fibroid tumor     KNEE SURGERY Bilateral    RETINAL DETACHMENT SURGERY Right    TOTAL KNEE ARTHROPLASTY Right 11/08/2017   TOTAL KNEE ARTHROPLASTY Right 11/08/2017   Procedure: RIGHT TOTAL KNEE ARTHROPLASTY;  Surgeon: Leandrew Koyanagi, MD;  Location: [facility];  Service: Orthopedics;  Laterality: Right;    Allergies  No Known Allergies  EKGs/Labs/Other Studies  Reviewed:   The following studies were reviewed today:  Echocardiogram 04/19/16  LV EF: 60% -   65%   -------------------------------------------------------------------  Indications:      R06.00 Dyspnea.   -------------------------------------------------------------------  History:   PMH:  Acquired from the patient and from the patient&'s  chart.  Risk factors:  Hypertension.  Hypercholesterolemia.   -------------------------------------------------------------------  Study Conclusions   - Left ventricle: The cavity size was normal. There was mild    concentric hypertrophy. Systolic function was normal. The     estimated ejection fraction was in the range of 60% to 65%. Wall    motion was normal; there were no regional wall motion    abnormalities. Left ventricular diastolic function parameters    were normal.  - Atrial septum: No defect or patent foramen ovale was identified.   Carotid US 11/21/2019 Summary:  Right Carotid: Velocities in the right ICA are consistent with a 1-39%  stenosis.   Left Carotid: Velocities in the left ICA are consistent with a 1-39%  stenosis.                 Elevated velocities without the presence of disease observed  in               the distal ICA.   Vertebrals:  Bilateral vertebral arteries demonstrate antegrade flow.  Subclavians: Normal flow hemodynamics were seen in bilateral subclavian               arteries.   EKG:  EKG is ordered today.  The ekg ordered today demonstrates NSR rate 64 bpm  Recent Labs: 04/22/2021: Hemoglobin 12.9; NT-Pro BNP 133; Platelets 242 06/07/2021: BUN 31; Creatinine, Ser 1.13; Potassium 3.8; Sodium 138  Recent Lipid Panel No results found for: "CHOL", "TRIG", "HDL", "CHOLHDL", "VLDL", "LDLCALC", "LDLDIRECT" Home Medications   Current Meds  Medication Sig   amoxicillin (AMOXIL) 500 MG capsule TAKE 4 CAPSULES BY MOUTH ONCE AS A SINGLE DOSE   aspirin EC 81 MG tablet Take 81 mg by mouth daily. Swallow whole.   azaTHIOprine (IMURAN) 50 MG tablet Take 1 tablet (50 mg total) by mouth daily. (Patient taking differently: Take 50 mg by mouth at bedtime.)   calcium carbonate (OSCAL) 1500 (600 Ca) MG TABS tablet Take 1,500 mg by mouth daily.   carvedilol (COREG) 12.5 MG tablet Take 1 tablet (12.5 mg total) by mouth 2 (two) times daily.   chlorthalidone (HYGROTON) 25 MG tablet Take 25 mg by mouth every other day.   Cholecalciferol (VITAMIN D) 2000 units CAPS Take 2,000 Units by mouth daily.   docusate sodium (COLACE) 100 MG capsule Take 100 mg by mouth daily.   ezetimibe (ZETIA) 10 MG tablet Take 10 mg by mouth daily.   Faricimab-svoa  (VABYSMO IZ) 1 Dose by Intravitreal route every 5 (five) weeks. Right eye   gabapentin (NEURONTIN) 300 MG capsule Take 300 mg by mouth 3 (three) times daily. Take up to 4 times per day (1200 mg)   omeprazole (PRILOSEC) 20 MG capsule Take 20 mg by mouth 2 (two) times daily before a meal.   OVER THE COUNTER MEDICATION Take 1 tablet by mouth daily. Vision Gold   pravastatin (PRAVACHOL) 40 MG tablet Take 40 mg by mouth daily.   rOPINIRole (REQUIP) 0.5 MG tablet    senna-docusate (SENOKOT S) 8.6-50 MG tablet Take 1 tablet by mouth at bedtime as needed.   [DISCONTINUED] acetaminophen (TYLENOL) 500 MG tablet 2 tab PO AM and 2 PO PM   [  DISCONTINUED] carvedilol (COREG) 25 MG tablet TAKE 1 TABLET (25 MG TOTAL) BY MOUTH 2 (TWO) TIMES DAILY WITH A MEAL.   [DISCONTINUED] folic acid (FOLVITE) 1 MG tablet Take 1 mg by mouth daily.    [DISCONTINUED] ibuprofen (ADVIL,MOTRIN) 600 MG tablet Take 1 tablet (600 mg total) by mouth every 6 (six) hours as needed.   [DISCONTINUED] meloxicam (MOBIC) 7.5 MG tablet TAKE 1-2 TABLETS (7.5-15 MG TOTAL) BY MOUTH DAILY.   [DISCONTINUED] nitroGLYCERIN (NITROSTAT) 0.4 MG SL tablet Place 0.4 mg under the tongue every 5 (five) minutes as needed for chest pain.    [DISCONTINUED] promethazine (PHENERGAN) 25 MG tablet Take 1 tablet (25 mg total) by mouth every 6 (six) hours as needed for nausea.   [DISCONTINUED] tiZANidine (ZANAFLEX) 4 MG tablet Take 1 tablet (4 mg total) by mouth every 6 (six) hours as needed for muscle spasms.   [DISCONTINUED] tiZANidine (ZANAFLEX) 4 MG tablet Take 1 tablet (4 mg total) by mouth every 6 (six) hours as needed for muscle spasms.     Review of Systems      All other systems reviewed and are otherwise negative except as noted above.  Physical Exam    VS:  BP 116/68   Pulse 64   Ht '5\' 6"'$  (1.676 m)   Wt 178 lb (80.7 kg)   SpO2 97%   BMI 28.73 kg/m  , BMI Body mass index is 28.73 kg/m.  Wt Readings from Last 3 Encounters:  04/11/22 178 lb  (80.7 kg)  04/22/21 208 lb 12.8 oz (94.7 kg)  10/29/18 205 lb 3.2 oz (93.1 kg)     GEN: Well nourished, well developed, in no acute distress. HEENT: normal. Neck: Supple, no JVD, carotid bruits, or masses. Cardiac: RRR, no murmurs, rubs, or gallops. No clubbing, cyanosis, edema.  Radials/PT 2+ and equal bilaterally.  Respiratory:  Respirations regular and unlabored, clear to auscultation bilaterally. GI: Soft, nontender, nondistended. MS: No deformity or atrophy. Skin: Warm and dry, no rash. Neuro:  Strength and sensation are intact. Psych: Normal affect.  Assessment & Plan     Dyspnea on exertion -with strong family history will plan on coronary CTA -Most recent lipid panel LDL 112. She was started on Zetia by her primary. Will try to obtain recent Lipid panel and if not one will order  Hypertension/hypotension -Today, she brings in her blood pressure Month or so she has had Episodes where her blood pressure drops more than 20 Sitting to standing.  She states that her hydration has not noticed but has been better than it was in the past.  She has been to elastic therapy and trying to use compression but has not been very successful.  She states they are way too tight.  She is tolerating her chlorthalidone by taking it every other day.  Her weight has been pretty stable and she is even lost about 4 pounds in the last month. -Blood pressure today in the clinic was 116/68 -She was taken off of valsartan and we will decrease her Coreg today to 12.5 mg twice daily  Lower extremity edema -Better than it had been per patient -Cannot tolerate lower extremity compression.  She has tried several different compression socks/stockings -Continue chlorthalidone every other day     Disposition: Follow up 1 year with Larae Grooms, MD or APP.  Signed, Elgie Collard, PA-C 04/11/2022, 12:00 PM [place]

## 2022-04-11 ENCOUNTER — Encounter: Payer: Self-pay | Admitting: Physician Assistant

## 2022-04-11 ENCOUNTER — Encounter: Payer: Self-pay | Admitting: Interventional Cardiology

## 2022-04-11 ENCOUNTER — Ambulatory Visit: Payer: Medicare PPO | Admitting: Physician Assistant

## 2022-04-11 VITALS — BP 116/68 | HR 64 | Ht 66.0 in | Wt 178.0 lb

## 2022-04-11 DIAGNOSIS — I959 Hypotension, unspecified: Secondary | ICD-10-CM | POA: Diagnosis not present

## 2022-04-11 DIAGNOSIS — R600 Localized edema: Secondary | ICD-10-CM

## 2022-04-11 DIAGNOSIS — R072 Precordial pain: Secondary | ICD-10-CM

## 2022-04-11 DIAGNOSIS — R0602 Shortness of breath: Secondary | ICD-10-CM

## 2022-04-11 LAB — BASIC METABOLIC PANEL
BUN/Creatinine Ratio: 25 (ref 12–28)
BUN: 22 mg/dL (ref 8–27)
CO2: 27 mmol/L (ref 20–29)
Calcium: 10 mg/dL (ref 8.7–10.3)
Chloride: 98 mmol/L (ref 96–106)
Creatinine, Ser: 0.87 mg/dL (ref 0.57–1.00)
Glucose: 99 mg/dL (ref 70–99)
Potassium: 3.6 mmol/L (ref 3.5–5.2)
Sodium: 138 mmol/L (ref 134–144)
eGFR: 73 mL/min/{1.73_m2} (ref >59)

## 2022-04-11 MED ORDER — CARVEDILOL 12.5 MG PO TABS: 12.5000 mg | ORAL_TABLET | Freq: Two times a day (BID) | ORAL | 3 refills | Status: DC

## 2022-04-11 NOTE — Patient Instructions (Addendum)
Medication Instructions:  Your physician has recommended you make the following change in your medication:  1.Decrease carvedilol to 12.5 mg twice a day  *If you need a refill on your cardiac medications before your next appointment, please call your pharmacy*   Lab Work: BMET today If you have labs (blood work) drawn today and your tests are completely normal, you will receive your results only by: [place] (if you have MyChart) OR A paper copy in the mail If you have any lab test that is abnormal or we need to change your treatment, we will call you to review the results.   Testing/Procedures:   Your cardiac CT will be scheduled at one of the below locations:   [facility] [address] [place], [place] [address] [phone]  [place] [address] [place], [place] [address] [phone]  If scheduled at [facility], please arrive at the [facility] and Children's Entrance (Entrance C2) of [facility] 30 minutes prior to test start time. You can use the FREE valet parking offered at entrance C (encouraged to control the heart rate for the test)  Proceed to the [facility] Radiology Department (first floor) to check-in and test prep.  All radiology patients and guests should use entrance C2 at [facility], accessed from [facility], even though the hospital's physical address listed is [address]    If scheduled at [facility], please arrive 15 mins early for check-in and test prep.  Please follow these instructions carefully (unless otherwise directed):  On the Night Before the Test: Be sure to Drink plenty of water. Do not consume any caffeinated/decaffeinated beverages or chocolate 12 hours prior to your test. Do not take any antihistamines 12 hours prior to your test.  On the Day of the Test: Drink plenty of water until  1 hour prior to the test. Do not eat any food 4 hours prior to the test. You may take your regular medications prior to the test.  Take carvedilol 25 mg two hours prior to test. HOLD chlorthalidone morning of the test. FEMALES- please wear underwire-free bra if available, avoid dresses & tight clothing       After the Test: Drink plenty of water. After receiving IV contrast, you may experience a mild flushed feeling. This is normal. On occasion, you may experience a mild rash up to 24 hours after the test. This is not dangerous. If this occurs, you can take Benadryl 25 mg and increase your fluid intake. If you experience trouble breathing, this can be serious. If it is severe call 911 IMMEDIATELY. If it is mild, please call our office. If you take any of these medications: Glipizide/Metformin, Avandament, Glucavance, please do not take 48 hours after completing test unless otherwise instructed.  We will call to schedule your test 2-4 weeks out understanding that some insurance companies will need an authorization prior to the service being performed.   For non-scheduling related questions, please contact the cardiac imaging nurse navigator should you have any questions/concerns: Marchia Bond, Cardiac Imaging Nurse Navigator Gordy Clement, Cardiac Imaging Nurse Navigator [facility] Direct Office Dial: [phone]   For scheduling needs, including cancellations and rescheduling, please call [place], [phone].    Follow-Up: At [facility], you and your health needs are our priority.  As part of our continuing mission to provide you with exceptional heart care, we  have created designated Provider Care Teams.  These Care Teams include your primary Cardiologist (physician) and Advanced Practice Providers (APPs -  Physician Assistants and Nurse Practitioners) who all work together to provide you with the care you need, when you need it.   Your next  appointment:   1 year(s)  The format for your next appointment:   In Person  Provider:   Larae Grooms, MD  or Nicholes Rough, PA-C       Important Information About [place]

## 2022-04-18 DIAGNOSIS — H35063 Retinal vasculitis, bilateral: Secondary | ICD-10-CM | POA: Diagnosis not present

## 2022-04-18 DIAGNOSIS — Z79899 Other long term (current) drug therapy: Secondary | ICD-10-CM | POA: Diagnosis not present

## 2022-04-18 DIAGNOSIS — H469 Unspecified optic neuritis: Secondary | ICD-10-CM | POA: Diagnosis not present

## 2022-04-18 DIAGNOSIS — H353211 Exudative age-related macular degeneration, right eye, with active choroidal neovascularization: Secondary | ICD-10-CM | POA: Diagnosis not present

## 2022-04-18 DIAGNOSIS — H35033 Hypertensive retinopathy, bilateral: Secondary | ICD-10-CM | POA: Diagnosis not present

## 2022-04-18 DIAGNOSIS — Z961 Presence of intraocular lens: Secondary | ICD-10-CM | POA: Diagnosis not present

## 2022-05-01 ENCOUNTER — Telehealth (HOSPITAL_COMMUNITY): Payer: Self-pay | Admitting: Emergency Medicine

## 2022-05-01 ENCOUNTER — Telehealth (HOSPITAL_COMMUNITY): Payer: Self-pay | Admitting: *Deleted

## 2022-05-01 NOTE — Telephone Encounter (Signed)
Patient returning call regarding upcoming cardiac imaging study; pt verbalizes understanding of appt date/time, parking situation and where to check in, pre-test NPO status and verified current allergies; name and call back number provided for further questions should they arise  Cheryl Clement RN Navigator Cardiac Imaging [facility] [phone] office [phone] cell  Patient to take her AM carvedilol two hours prior to her cardiac CT scan.

## 2022-05-01 NOTE — Telephone Encounter (Signed)
Attempted to call patient regarding upcoming cardiac CT appointment. °Left message on voicemail with name and callback number °Surina Storts RN Navigator Cardiac Imaging °[facility] °[phone] Office °[phone] Cell ° °

## 2022-05-02 ENCOUNTER — Ambulatory Visit (HOSPITAL_COMMUNITY)
Admission: RE | Admit: 2022-05-02 | Discharge: 2022-05-02 | Disposition: A | Discharge status: Home / Self Care | Payer: Medicare PPO | Source: Ambulatory Visit | Attending: Cardiology | Admitting: Cardiology

## 2022-05-02 ENCOUNTER — Ambulatory Visit (HOSPITAL_BASED_OUTPATIENT_CLINIC_OR_DEPARTMENT_OTHER)
Admission: RE | Admit: 2022-05-02 | Discharge: 2022-05-02 | Disposition: A | Discharge status: Home / Self Care | Payer: Medicare PPO | Source: Ambulatory Visit | Attending: Cardiology | Admitting: Cardiology

## 2022-05-02 ENCOUNTER — Ambulatory Visit (HOSPITAL_COMMUNITY)
Admission: RE | Admit: 2022-05-02 | Discharge: 2022-05-02 | Disposition: A | Discharge status: Home / Self Care | Payer: Medicare PPO | Source: Ambulatory Visit | Attending: Physician Assistant | Admitting: Physician Assistant

## 2022-05-02 ENCOUNTER — Other Ambulatory Visit: Payer: Self-pay | Admitting: Cardiology

## 2022-05-02 DIAGNOSIS — R931 Abnormal findings on diagnostic imaging of heart and coronary circulation: Secondary | ICD-10-CM | POA: Insufficient documentation

## 2022-05-02 DIAGNOSIS — I2510 Atherosclerotic heart disease of native coronary artery without angina pectoris: Secondary | ICD-10-CM | POA: Diagnosis not present

## 2022-05-02 DIAGNOSIS — R0602 Shortness of breath: Secondary | ICD-10-CM | POA: Insufficient documentation

## 2022-05-02 MED ORDER — NITROGLYCERIN 0.4 MG SL SUBL
SUBLINGUAL_TABLET | SUBLINGUAL | Status: AC
  Filled 2022-05-02: qty 2

## 2022-05-02 MED ORDER — IOHEXOL 350 MG/ML SOLN
100.0000 mL | Freq: Once | INTRAVENOUS | Status: AC | PRN
  Administered 2022-05-02: 100 mL via INTRAVENOUS

## 2022-05-02 MED ORDER — NITROGLYCERIN 0.4 MG SL SUBL
0.8000 mg | SUBLINGUAL_TABLET | Freq: Once | SUBLINGUAL | Status: AC
  Administered 2022-05-02: 0.8 mg via SUBLINGUAL

## 2022-05-08 ENCOUNTER — Other Ambulatory Visit: Payer: Self-pay

## 2022-05-08 NOTE — Telephone Encounter (Signed)
Spoke with the patient who reports that she is not taking valsartan.  She is currently taking chlorthalidone 25 mg every other day. Will clarify decreasing chlorthalidone with Dr. Irish Lack.

## 2022-05-08 NOTE — Telephone Encounter (Signed)
-----   Message from Jettie Booze, MD sent at 05/08/2022  8:07 AM EDT ----- Is she off of valsartan completely?  If so, can decrease chlorthalidone to 12.5 mg daily

## 2022-05-09 ENCOUNTER — Telehealth: Payer: Self-pay | Admitting: Interventional Cardiology

## 2022-05-09 DIAGNOSIS — R600 Localized edema: Secondary | ICD-10-CM | POA: Diagnosis not present

## 2022-05-09 DIAGNOSIS — I739 Peripheral vascular disease, unspecified: Secondary | ICD-10-CM | POA: Diagnosis not present

## 2022-05-09 DIAGNOSIS — R252 Cramp and spasm: Secondary | ICD-10-CM | POA: Diagnosis not present

## 2022-05-09 DIAGNOSIS — I959 Hypotension, unspecified: Secondary | ICD-10-CM | POA: Diagnosis not present

## 2022-05-09 DIAGNOSIS — I10 Essential (primary) hypertension: Secondary | ICD-10-CM | POA: Diagnosis not present

## 2022-05-09 DIAGNOSIS — D649 Anemia, unspecified: Secondary | ICD-10-CM | POA: Diagnosis not present

## 2022-05-09 DIAGNOSIS — I2510 Atherosclerotic heart disease of native coronary artery without angina pectoris: Secondary | ICD-10-CM | POA: Diagnosis not present

## 2022-05-09 MED ORDER — CHLORTHALIDONE 25 MG PO TABS: 12.5000 mg | ORAL_TABLET | ORAL | 3 refills | Status: DC

## 2022-05-09 NOTE — Telephone Encounter (Signed)
Attempted to return call.  Went to intake person who routed this nurse to Dr. Silvestre Mesi covering nurse.  Left VM.  [place] NP should call back and leave direct number.  Await further needs.

## 2022-05-09 NOTE — Addendum Note (Signed)
Addended by: Aris Georgia, Myka Hitz L on: 05/09/2022 02:18 PM   Modules accepted: Orders

## 2022-05-09 NOTE — Telephone Encounter (Signed)
[place] NP from patient's PCP office. She would like Dr. Irish Lack to review a few things and see what his recommendations are.    1-Patient is taking Chlorthalidone 25 mg by mouth every other day. Patient is doing will on this dose/frequency, and lab work looks fine. Is this okay?  2- Patient had Cardiac CT.   Elgie Collard, PA-C  05/03/2022 12:24 PM EDT     Ms. [name],    We did find some coronary artery disease on your recent coronary CT scan.  It showed that your LAD artery had moderate (50 to 69%) stenosis, your left circumflex artery had moderate (50 to 69%) stenosis, and your OM1 branch had moderate (50 to 69%) stenosis, there was mild (25 to 49%) stenosis of your RCA.  When we looked using FFR (fractional flow reserve), there were no arteries that needed any intervention at this time. We will continue with your current medical management.    If you have questions please let me know!   Elgie Collard, PA-C        Since patient's calcium score was 670. She was wondering if the patient should follow up sooner than a year.  3- Patient's lab work Feb 07, 2022   Total Cholesterol  179  Triglycerides           56  HDL          56  LDL                       112  Due to patient's Ca score, PCP, stopped pravastatin and switched patient to crestor 20 mg by mouth daily. They will recheck labs again in November. Patient is also on Zetia 10 mg daily. Is this okay?

## 2022-05-09 NOTE — Telephone Encounter (Signed)
Good afternoon! Received call from Caprice Beaver, NP from [facility] and she said that the patient has not been taking the medication chlorthalidone (HYGROTON) 25 MG tablet as Directed. Medication is showing discontinued as well. Please call to discuss with Caprice Beaver at [phone]

## 2022-05-09 NOTE — Telephone Encounter (Signed)
Caller provided direct number to Caprice Beaver NP [phone] for RN follow-up.

## 2022-05-10 NOTE — Telephone Encounter (Signed)
Caprice Beaver NP has reviewed phone note, and she is aware of Dr. Hassell Done advisement.

## 2022-05-23 DIAGNOSIS — H35063 Retinal vasculitis, bilateral: Secondary | ICD-10-CM | POA: Diagnosis not present

## 2022-05-23 DIAGNOSIS — H353211 Exudative age-related macular degeneration, right eye, with active choroidal neovascularization: Secondary | ICD-10-CM | POA: Diagnosis not present

## 2022-05-23 DIAGNOSIS — H35033 Hypertensive retinopathy, bilateral: Secondary | ICD-10-CM | POA: Diagnosis not present

## 2022-05-23 DIAGNOSIS — H3321 Serous retinal detachment, right eye: Secondary | ICD-10-CM | POA: Diagnosis not present

## 2022-06-20 DIAGNOSIS — M5416 Radiculopathy, lumbar region: Secondary | ICD-10-CM | POA: Diagnosis not present

## 2022-06-22 ENCOUNTER — Telehealth: Payer: Self-pay | Admitting: *Deleted

## 2022-06-22 DIAGNOSIS — Z006 Encounter for examination for normal comparison and control in clinical research program: Secondary | ICD-10-CM

## 2022-06-22 NOTE — Telephone Encounter (Signed)
I spoke to patient about [place] 1-Prevention Study.. I have set up screening appointment with Dr Lia Foyer for Oct.10 at 8:30 am. I will email copy of consent and parking garage code to patient.

## 2022-06-27 DIAGNOSIS — Z79899 Other long term (current) drug therapy: Secondary | ICD-10-CM | POA: Diagnosis not present

## 2022-06-27 DIAGNOSIS — H35063 Retinal vasculitis, bilateral: Secondary | ICD-10-CM | POA: Diagnosis not present

## 2022-06-27 DIAGNOSIS — H469 Unspecified optic neuritis: Secondary | ICD-10-CM | POA: Diagnosis not present

## 2022-06-27 DIAGNOSIS — Z961 Presence of intraocular lens: Secondary | ICD-10-CM | POA: Diagnosis not present

## 2022-06-27 DIAGNOSIS — H35033 Hypertensive retinopathy, bilateral: Secondary | ICD-10-CM | POA: Diagnosis not present

## 2022-06-27 DIAGNOSIS — H353211 Exudative age-related macular degeneration, right eye, with active choroidal neovascularization: Secondary | ICD-10-CM | POA: Diagnosis not present

## 2022-07-04 ENCOUNTER — Encounter: Payer: Medicare PPO | Admitting: *Deleted

## 2022-07-04 VITALS — BP 140/77 | HR 64 | Temp 97.6°F | Resp 14 | Ht 66.0 in | Wt 171.0 lb

## 2022-07-04 DIAGNOSIS — Z006 Encounter for examination for normal comparison and control in clinical research program: Secondary | ICD-10-CM

## 2022-07-04 NOTE — Research (Signed)
V1P   Victorion 1 Prevent  Informed Consent   Subject Name: [name]  Subject met inclusion and exclusion criteria.  The informed consent form, study requirements and expectations were reviewed with the subject and questions and concerns were addressed prior to the signing of the consent form.  The subject verbalized understanding of the trial requirements.  The subject agreed to participate in the Victorion 1 Prevent  trial and signed the informed consent at 0830 on 04-Jul-2022.  The informed consent was obtained prior to performance of any protocol-specific procedures for the subject.  A copy of the signed informed consent was given to the subject and a copy was placed in the subject's medical record.   Philemon Kingdom D    Inclusion  YES NO   1-Written informed consent must be obtained before any assessment is performed   [x]  []    2-Female or female ?68 but <68 years of age   [x]  []    3-At an increased risk for a first MACE (i.e., no prior major ASCVD event), defined as any one of the following       a. Evidence of atherosclerotic coronary artery disease (CAD) on computer tomography (CT) or invasive coronary angiogram defined as a coronary artery stenosis ?20% but <50% in the left main coronary artery or stenosis ?20% but <70% in any other major epicardial coronary artery, or   []     b. Coronary artery calcium (CAC) score obtained by CT-scan ?100 Agatston units, or   [x]  _670_    c. High 10-year ASCVD risk ?20%, or   []     d. Intermediate 10-year ASCVD risk 7.5% - <20% with at least 2 risk-enhancing factors   []     If on a background LLT, the dose should be stable for at least 4 weeks prior to the screening visit and the participant should be willing to remain on this background therapy for the entire duration of the study.   [x]  []    LDL-C ?70 mg/dL (?1.81 mmol/L) but <190 mg/dL (<4.91 mmol/L) at the screening visit.   [x]  []     Exclusion    YES      NO    History of major ASCVD event defined as any one of the following   []  [x]    a. Acute coronary syndrome (ACS) in the 12 months prior to randomization, or   []  [x]    b. Prior myocardial infarction at any time prior to randomization   []  [x]    c. Prior ischemic stroke at any time prior to randomization   []  [x]    d. Symptomatic peripheral artery disease (PAD) as evidenced by either intermittent claudication, previous revascularization, or amputation due to atherosclerotic disease at any time prior to randomization.   []  [x]    History of, or planned, ischemia-driven revascularization in a coronary or extracoronary arterial bed prior to randomization   []  [x]    Absence of coronary atherosclerosis on a CT angiogram or an invasive coronary angiogram in the 2 years prior to randomization   []  [x]    Coronary artery calcium (CAC) score of 0 obtained in the 2 years prior to randomization   []  [x]    Active liver disease or hepatic dysfunction   []  [x]    Previous, current, or planned treatment with a monoclonal antibody (mAb) directed toward proprotein convertase subtilisin/kexin type 9 (PCSK9) (e.g., evolocumab, alirocumab)   []  [x]    Pregnant or nursing (lactating) women   []  [x]    Women of childbearing  potential unless they are using effective methods of contraception while taking study treatment which includes for 6 months after last study drug administration   []  [x]      Vitals:   07/04/22 0846 07/04/22 0849 07/04/22 0857  BP: 123/67 131/77 (!) 140/77  Pulse: 70 68 64  Temp: 97.6 F (36.4 C)    Resp: 14    Height: 5' 6"  (1.676 m)    Weight: 171 lb (77.6 kg)    SpO2: 99%    BMI (Calculated): 27.61       Current Outpatient Medications:    aspirin EC 81 MG tablet, Take 81 mg by mouth daily. Swallow whole., Disp: , Rfl:    azaTHIOprine (IMURAN) 50 MG tablet, Take 1 tablet (50 mg total) by mouth daily. (Patient taking differently: Take 50 mg by mouth at bedtime.), Disp: 90  tablet, Rfl: 4   Calcium-Magnesium-Zinc 333-133-5 MG TABS, Take 1 tablet by mouth daily at 2 PM. Take one a daily, Disp: , Rfl:    carvedilol (COREG) 12.5 MG tablet, Take 1 tablet (12.5 mg total) by mouth 2 (two) times daily., Disp: 180 tablet, Rfl: 3   chlorthalidone (HYGROTON) 25 MG tablet, Take 25 mg by mouth every other day., Disp: , Rfl:    Cholecalciferol (VITAMIN D3) 1.25 MG (50000 UT) CAPS, Take 1 tablet by mouth daily., Disp: , Rfl:    docusate sodium (COLACE) 100 MG capsule, Take 100 mg by mouth daily., Disp: , Rfl:    ezetimibe (ZETIA) 10 MG tablet, Take 10 mg by mouth daily., Disp: , Rfl:    Faricimab-svoa (VABYSMO IZ), 1 Dose by Intravitreal route every 5 (five) weeks. Right eye, Disp: , Rfl:    gabapentin (NEURONTIN) 300 MG capsule, Take 300 mg by mouth at bedtime. Take up to 4 times per day (1200 mg) Per patient taking 2 (63m) at night, Disp: , Rfl:    omeprazole (PRILOSEC) 20 MG capsule, Take 20 mg by mouth daily., Disp: , Rfl: 8   OVER THE COUNTER MEDICATION, Take 1 tablet by mouth daily. Vision Gold, Disp: , Rfl:    rOPINIRole (REQUIP) 0.5 MG tablet, Take 0.5 mg by mouth at bedtime., Disp: , Rfl:    rosuvastatin (CRESTOR) 20 MG tablet, Take 20 mg by mouth daily., Disp: , Rfl:    vitamin B-12 (CYANOCOBALAMIN) 250 MCG tablet, Take 2,500 mcg by mouth daily., Disp: , Rfl:    senna-docusate (SENOKOT S) 8.6-50 MG tablet, Take 1 tablet by mouth at bedtime as needed. (Patient not taking: Reported on 07/04/2022), Disp: 30 tablet, Rfl: 1

## 2022-07-04 NOTE — Progress Notes (Signed)
Patient seen in screening visit for possible inclusion in Victorian-1 Prevent trial, a randomized trial of inclisiran vs. Placebo in patients at high risk.  She has a calcium score of 670, 96th percentile for age and sex.  She also has non critical disease an is on rosuvastatin '20mg'$ .  She had an elevated cholesterol, but repeat has not been obtained.  She is here for repeat testing as part of the trial.  I reviewed her chart in detail and explained the goals of the trial in detail.   I also spoke with her and we will await results of her testing, and then discuss options with Dr. Irish Lack and Dr. Joylene Draft before proceeding given her circumstances.  She is not having chest pain.  She does have other CV risk factors, but has lost 40 lbs and had reduction in her BP meds.  She is on combo therapy at the moment with rosuvastatin, and ezetamibe.    Alert, oriented in full command of discussion asking good questions. BP 140/77 P 64 T97.6  R unlabored 14 ?soft R carotid bruit Lungs clear Cor reg Abd neg Ext slight edema bilaterally    Patient will be screened and we will rediscuss with Dr. Nonnie Done after results.    Loretha Brasil. Lia Foyer, MD, [place] Director, [facility]

## 2022-07-06 NOTE — Research (Signed)
Are there any labs that are clinically significant?  Yes '[]'$  OR No'[]'$   Is the patient eligible to continue enrollment in the study after screening visit?  Yes '[]'$   OR No'[]'$    Screening labs:

## 2022-07-18 ENCOUNTER — Encounter: Payer: Medicare PPO | Admitting: *Deleted

## 2022-07-18 VITALS — BP 128/72 | HR 64 | Temp 97.2°F | Resp 16 | Ht 66.0 in | Wt 168.5 lb

## 2022-07-18 DIAGNOSIS — Z006 Encounter for examination for normal comparison and control in clinical research program: Secondary | ICD-10-CM

## 2022-07-18 MED ORDER — STUDY - VICTORION-1 PREVENT - INCLISIRAN 300 MG/1.5 ML OR PLACEBO SQ INJECTION (PI-STUCKEY)
300.0000 mg | INJECTION | SUBCUTANEOUS | Status: DC
  Administered 2022-07-18: 300 mg via SUBCUTANEOUS
  Filled 2022-07-18: qty 1.5

## 2022-07-18 NOTE — Research (Signed)
V1P Baseline  Patient feeling good no chest pains or shortness of breath.  Lifestyle instructions reviewed with patient.  Will see patient back in 90 days October 18, 2022 at [place]:   07/18/22 0940 07/18/22 0942 07/18/22 0945  BP: 132/76 121/71 128/72  Pulse: 72 68 64  Temp: (!) 97.2 F (36.2 C)    Resp: 16    Height: '5\' 6"'$  (1.676 m)    Weight: 168 lb 8 oz (76.4 kg)    SpO2: 100%    BMI (Calculated): 27.21     Mean BP: 127/73  Mean HR: 73 Labs drawn today @ 0935  Study treatment:  Administrations This Visit     Study - VICTORION-1 PREVENT - inclisiran 300 mg/1.642m or placebo SQ injection ([facility]uc[facility])     Admin Date 07/18/2022 Action Given Dose 300 mg Route Subcutaneous Administered By LPatton Salles RN            Current Outpatient Medications:    aspirin EC 81 MG tablet, Take 81 mg by mouth daily. Swallow whole., Disp: , Rfl:    azaTHIOprine (IMURAN) 50 MG tablet, Take 1 tablet (50 mg total) by mouth daily. (Patient taking differently: Take 50 mg by mouth at bedtime.), Disp: 90 tablet, Rfl: 4   Calcium-Magnesium-Zinc 333-133-5 MG TABS, Take 1 tablet by mouth daily at 2 PM. Take one a daily, Disp: , Rfl:    carvedilol (COREG) 12.5 MG tablet, Take 1 tablet (12.5 mg total) by mouth 2 (two) times daily., Disp: 180 tablet, Rfl: 3   chlorthalidone (HYGROTON) 25 MG tablet, Take 25 mg by mouth every other day., Disp: , Rfl:    Cholecalciferol (VITAMIN D3) 1.25 MG (50000 UT) CAPS, Take 1 tablet by mouth daily., Disp: , Rfl:    docusate sodium (COLACE) 100 MG capsule, Take 100 mg by mouth daily., Disp: , Rfl:    ezetimibe (ZETIA) 10 MG tablet, Take 10 mg by mouth daily., Disp: , Rfl:    Faricimab-svoa (VABYSMO IZ), 1 Dose by Intravitreal route every 5 (five) weeks. Right eye, Disp: , Rfl:    gabapentin (NEURONTIN) 300 MG capsule, Take 300 mg by mouth at bedtime. Take up to 4 times per day (1200 mg) Per patient taking 2 ('600mg'$ ) at night, Disp: , Rfl:     omeprazole (PRILOSEC) 20 MG capsule, Take 20 mg by mouth daily., Disp: , Rfl: 8   OVER THE COUNTER MEDICATION, Take 1 tablet by mouth daily. Vision Gold, Disp: , Rfl:    rOPINIRole (REQUIP) 0.5 MG tablet, Take 0.5 mg by mouth at bedtime., Disp: , Rfl:    rosuvastatin (CRESTOR) 20 MG tablet, Take 20 mg by mouth daily., Disp: , Rfl:    Study - VICTORION-1 PREVENT - inclisiran 300 mg/1.57mor placebo SQ injection (PI-Stuckey), Inject 300 mg into the skin every 6 (six) months. For Investigational Use Only. Inject subcutaneously into the abdomen, upper arm or thigh every 6 months. Do not inject into areas of active skin disease,such as sun burns, skin rashes, inflammation, or skin infections.Please contact [place] any questions or concerns., Disp: , Rfl:    vitamin B-12 (CYANOCOBALAMIN) 250 MCG tablet, Take 2,500 mcg by mouth daily., Disp: , Rfl:    senna-docusate (SENOKOT S) 8.6-50 MG tablet, Take 1 tablet by mouth at bedtime as needed. (Patient not taking: Reported on 07/04/2022), Disp: 30 tablet, Rfl: 1  Current Facility-Administered Medications:    Study - VICTORION-1 PREVENT - inclisiran 300 mg/1.42m60mr placebo SQ  injection (PI-Stuckey), 300 mg, Subcutaneous, Q6 months, Hillary Bow, MD, 300 mg at 07/18/22 1014

## 2022-07-18 NOTE — Research (Signed)
V1P Genetics Informed Consent   Subject Name: [name]  Subject met inclusion and exclusion criteria.  The informed consent form, study requirements and expectations were reviewed with the subject and questions and concerns were addressed prior to the signing of the consent form.  The subject verbalized understanding of the trial requirements.  The subject agreed to participate in the [place] 1 Prevent trial and signed the informed consent on 07/18/2022.  The informed consent was obtained prior to performance of any protocol-specific procedures for the subject.  A copy of the signed informed consent was given to the subject and a copy was placed in the subject's medical record.   Philemon Kingdom D

## 2022-07-19 NOTE — Progress Notes (Signed)
This very nice patient is back for initial injection for inclusion in the Victorian-1 Prevent Trial.  She has an elevated calcium score and prior CTA with non critical disease and normal FFR  (RCA unable to detect).  She is asymptomatic. Repeat LDL on medical therapy was 74 mg/dl.  I discussed her case with her cardiologist Dr. Casandra Doffing who thought she would be a good candidate for this trial.  She was randomized today to inclisiran vs. Placebo (for Primary Prevention).  She received her first dose, and had a very minimal redness at the injection site, but no other symptoms.    VS as recorded.   Loretha Brasil. Lia Foyer, MD, [place] Director, [facility].

## 2022-07-21 NOTE — Research (Signed)
Are there any labs that are clinically significant?  Yes [] OR No[]       

## 2022-07-27 ENCOUNTER — Other Ambulatory Visit: Payer: Self-pay | Admitting: Internal Medicine

## 2022-07-27 DIAGNOSIS — Z1231 Encounter for screening mammogram for malignant neoplasm of breast: Secondary | ICD-10-CM

## 2022-08-01 DIAGNOSIS — H469 Unspecified optic neuritis: Secondary | ICD-10-CM | POA: Diagnosis not present

## 2022-08-01 DIAGNOSIS — H35033 Hypertensive retinopathy, bilateral: Secondary | ICD-10-CM | POA: Diagnosis not present

## 2022-08-01 DIAGNOSIS — Z79899 Other long term (current) drug therapy: Secondary | ICD-10-CM | POA: Diagnosis not present

## 2022-08-01 DIAGNOSIS — Z961 Presence of intraocular lens: Secondary | ICD-10-CM | POA: Diagnosis not present

## 2022-08-01 DIAGNOSIS — H353211 Exudative age-related macular degeneration, right eye, with active choroidal neovascularization: Secondary | ICD-10-CM | POA: Diagnosis not present

## 2022-08-01 DIAGNOSIS — H35063 Retinal vasculitis, bilateral: Secondary | ICD-10-CM | POA: Diagnosis not present

## 2022-08-01 DIAGNOSIS — H3321 Serous retinal detachment, right eye: Secondary | ICD-10-CM | POA: Diagnosis not present

## 2022-08-02 DIAGNOSIS — R600 Localized edema: Secondary | ICD-10-CM | POA: Diagnosis not present

## 2022-08-02 DIAGNOSIS — I2510 Atherosclerotic heart disease of native coronary artery without angina pectoris: Secondary | ICD-10-CM | POA: Diagnosis not present

## 2022-08-02 DIAGNOSIS — Z23 Encounter for immunization: Secondary | ICD-10-CM | POA: Diagnosis not present

## 2022-08-02 DIAGNOSIS — I10 Essential (primary) hypertension: Secondary | ICD-10-CM | POA: Diagnosis not present

## 2022-08-02 DIAGNOSIS — K5900 Constipation, unspecified: Secondary | ICD-10-CM | POA: Diagnosis not present

## 2022-08-02 DIAGNOSIS — R1032 Left lower quadrant pain: Secondary | ICD-10-CM | POA: Diagnosis not present

## 2022-08-02 DIAGNOSIS — Z79899 Other long term (current) drug therapy: Secondary | ICD-10-CM | POA: Diagnosis not present

## 2022-08-02 DIAGNOSIS — I739 Peripheral vascular disease, unspecified: Secondary | ICD-10-CM | POA: Diagnosis not present

## 2022-08-28 DIAGNOSIS — I739 Peripheral vascular disease, unspecified: Secondary | ICD-10-CM | POA: Diagnosis not present

## 2022-08-28 DIAGNOSIS — M179 Osteoarthritis of knee, unspecified: Secondary | ICD-10-CM | POA: Diagnosis not present

## 2022-08-28 DIAGNOSIS — M4802 Spinal stenosis, cervical region: Secondary | ICD-10-CM | POA: Diagnosis not present

## 2022-08-28 DIAGNOSIS — H8110 Benign paroxysmal vertigo, unspecified ear: Secondary | ICD-10-CM | POA: Diagnosis not present

## 2022-08-28 DIAGNOSIS — I10 Essential (primary) hypertension: Secondary | ICD-10-CM | POA: Diagnosis not present

## 2022-08-28 DIAGNOSIS — I6529 Occlusion and stenosis of unspecified carotid artery: Secondary | ICD-10-CM | POA: Diagnosis not present

## 2022-08-28 DIAGNOSIS — R7301 Impaired fasting glucose: Secondary | ICD-10-CM | POA: Diagnosis not present

## 2022-08-28 DIAGNOSIS — E785 Hyperlipidemia, unspecified: Secondary | ICD-10-CM | POA: Diagnosis not present

## 2022-08-28 DIAGNOSIS — I2510 Atherosclerotic heart disease of native coronary artery without angina pectoris: Secondary | ICD-10-CM | POA: Diagnosis not present

## 2022-08-31 ENCOUNTER — Ambulatory Visit
Admission: RE | Admit: 2022-08-31 | Discharge: 2022-08-31 | Disposition: A | Discharge status: Home / Self Care | Payer: Medicare PPO | Source: Ambulatory Visit | Attending: Internal Medicine | Admitting: Internal Medicine

## 2022-08-31 DIAGNOSIS — Z1231 Encounter for screening mammogram for malignant neoplasm of breast: Secondary | ICD-10-CM | POA: Diagnosis not present

## 2022-09-04 ENCOUNTER — Other Ambulatory Visit: Payer: Self-pay | Admitting: Internal Medicine

## 2022-09-04 DIAGNOSIS — R928 Other abnormal and inconclusive findings on diagnostic imaging of breast: Secondary | ICD-10-CM

## 2022-09-12 DIAGNOSIS — H35033 Hypertensive retinopathy, bilateral: Secondary | ICD-10-CM | POA: Diagnosis not present

## 2022-09-12 DIAGNOSIS — H35063 Retinal vasculitis, bilateral: Secondary | ICD-10-CM | POA: Diagnosis not present

## 2022-09-12 DIAGNOSIS — Z8669 Personal history of other diseases of the nervous system and sense organs: Secondary | ICD-10-CM | POA: Diagnosis not present

## 2022-09-12 DIAGNOSIS — H469 Unspecified optic neuritis: Secondary | ICD-10-CM | POA: Diagnosis not present

## 2022-09-12 DIAGNOSIS — H353211 Exudative age-related macular degeneration, right eye, with active choroidal neovascularization: Secondary | ICD-10-CM | POA: Diagnosis not present

## 2022-09-12 DIAGNOSIS — Z79899 Other long term (current) drug therapy: Secondary | ICD-10-CM | POA: Diagnosis not present

## 2022-09-12 DIAGNOSIS — Z961 Presence of intraocular lens: Secondary | ICD-10-CM | POA: Diagnosis not present

## 2022-09-26 DIAGNOSIS — M48062 Spinal stenosis, lumbar region with neurogenic claudication: Secondary | ICD-10-CM | POA: Diagnosis not present

## 2022-10-03 ENCOUNTER — Ambulatory Visit: Admission: RE | Admit: 2022-10-03 | Payer: Medicare PPO | Source: Ambulatory Visit

## 2022-10-03 ENCOUNTER — Ambulatory Visit
Admission: RE | Admit: 2022-10-03 | Discharge: 2022-10-03 | Disposition: A | Discharge status: Home / Self Care | Payer: Medicare PPO | Source: Ambulatory Visit | Attending: Internal Medicine | Admitting: Internal Medicine

## 2022-10-03 DIAGNOSIS — R928 Other abnormal and inconclusive findings on diagnostic imaging of breast: Secondary | ICD-10-CM

## 2022-10-03 DIAGNOSIS — N6489 Other specified disorders of breast: Secondary | ICD-10-CM | POA: Diagnosis not present

## 2022-10-12 DIAGNOSIS — H43812 Vitreous degeneration, left eye: Secondary | ICD-10-CM | POA: Diagnosis not present

## 2022-10-12 DIAGNOSIS — Z961 Presence of intraocular lens: Secondary | ICD-10-CM | POA: Diagnosis not present

## 2022-10-12 DIAGNOSIS — H353110 Nonexudative age-related macular degeneration, right eye, stage unspecified: Secondary | ICD-10-CM | POA: Diagnosis not present

## 2022-10-12 DIAGNOSIS — Z9889 Other specified postprocedural states: Secondary | ICD-10-CM | POA: Diagnosis not present

## 2022-10-12 DIAGNOSIS — H469 Unspecified optic neuritis: Secondary | ICD-10-CM | POA: Diagnosis not present

## 2022-10-12 DIAGNOSIS — H33001 Unspecified retinal detachment with retinal break, right eye: Secondary | ICD-10-CM | POA: Diagnosis not present

## 2022-10-18 ENCOUNTER — Encounter: Payer: Medicare PPO | Admitting: *Deleted

## 2022-10-18 DIAGNOSIS — Z006 Encounter for examination for normal comparison and control in clinical research program: Secondary | ICD-10-CM

## 2022-10-18 MED ORDER — STUDY - VICTORION-1 PREVENT - INCLISIRAN 300 MG/1.5 ML OR PLACEBO SQ INJECTION (PI-STUCKEY)
300.0000 mg | INJECTION | SUBCUTANEOUS | Status: DC
  Administered 2022-10-18: 300 mg via SUBCUTANEOUS
  Filled 2022-10-18: qty 1.5

## 2022-10-19 NOTE — Research (Signed)
Visit 2  Prior or concomitant medications reviewed '[x]'$   Lipid lowering medications reviewed '[x]'$   Surgical history reviewed '[x]'$   Vitals:   10/18/22 0901 10/18/22 0905 10/18/22 0909  BP: 137/72 127/60 (!) 120/58  Pulse: 67 64 62  Temp: 97.7 F (36.5 C)    Resp: 18    Weight: 167 lb 6.4 oz (75.9 kg)    SpO2: 100% 100% 100%      Mean BP: 128/63   Mean Pulse: 64  Labs collected at: 10/18/2022 @ 0918 Fasting Lipid Profile '[x]'$  Fasting Lp(a) '[x]'$  Liver function tests '[]'$  Exploratory biomarkers '[]'$   AE/SAE assessment completed '[x]'$   Study drug administered '[x]'$   Endpoint assessment completed '[x]'$ 

## 2022-10-24 DIAGNOSIS — H35033 Hypertensive retinopathy, bilateral: Secondary | ICD-10-CM | POA: Diagnosis not present

## 2022-10-24 DIAGNOSIS — H35063 Retinal vasculitis, bilateral: Secondary | ICD-10-CM | POA: Diagnosis not present

## 2022-10-24 DIAGNOSIS — Z79899 Other long term (current) drug therapy: Secondary | ICD-10-CM | POA: Diagnosis not present

## 2022-10-24 DIAGNOSIS — H353211 Exudative age-related macular degeneration, right eye, with active choroidal neovascularization: Secondary | ICD-10-CM | POA: Diagnosis not present

## 2022-10-24 DIAGNOSIS — H3321 Serous retinal detachment, right eye: Secondary | ICD-10-CM | POA: Diagnosis not present

## 2022-10-27 NOTE — Research (Signed)
Please review

## 2022-12-05 DIAGNOSIS — H353211 Exudative age-related macular degeneration, right eye, with active choroidal neovascularization: Secondary | ICD-10-CM | POA: Diagnosis not present

## 2022-12-20 DIAGNOSIS — Z6827 Body mass index (BMI) 27.0-27.9, adult: Secondary | ICD-10-CM | POA: Diagnosis not present

## 2022-12-20 DIAGNOSIS — M48062 Spinal stenosis, lumbar region with neurogenic claudication: Secondary | ICD-10-CM | POA: Diagnosis not present

## 2022-12-20 DIAGNOSIS — M5416 Radiculopathy, lumbar region: Secondary | ICD-10-CM | POA: Diagnosis not present

## 2023-01-11 ENCOUNTER — Other Ambulatory Visit: Payer: Self-pay | Admitting: Pain Medicine

## 2023-01-11 DIAGNOSIS — M48062 Spinal stenosis, lumbar region with neurogenic claudication: Secondary | ICD-10-CM

## 2023-01-16 DIAGNOSIS — Z8669 Personal history of other diseases of the nervous system and sense organs: Secondary | ICD-10-CM | POA: Diagnosis not present

## 2023-01-16 DIAGNOSIS — H35033 Hypertensive retinopathy, bilateral: Secondary | ICD-10-CM | POA: Diagnosis not present

## 2023-01-16 DIAGNOSIS — H353211 Exudative age-related macular degeneration, right eye, with active choroidal neovascularization: Secondary | ICD-10-CM | POA: Diagnosis not present

## 2023-01-16 DIAGNOSIS — H35063 Retinal vasculitis, bilateral: Secondary | ICD-10-CM | POA: Diagnosis not present

## 2023-01-16 DIAGNOSIS — Z961 Presence of intraocular lens: Secondary | ICD-10-CM | POA: Diagnosis not present

## 2023-01-16 DIAGNOSIS — Z79899 Other long term (current) drug therapy: Secondary | ICD-10-CM | POA: Diagnosis not present

## 2023-01-16 DIAGNOSIS — H469 Unspecified optic neuritis: Secondary | ICD-10-CM | POA: Diagnosis not present

## 2023-02-09 ENCOUNTER — Ambulatory Visit
Admission: RE | Admit: 2023-02-09 | Discharge: 2023-02-09 | Disposition: A | Discharge status: Home / Self Care | Payer: Medicare PPO | Source: Ambulatory Visit | Attending: Pain Medicine | Admitting: Pain Medicine

## 2023-02-09 DIAGNOSIS — M48062 Spinal stenosis, lumbar region with neurogenic claudication: Secondary | ICD-10-CM

## 2023-02-09 DIAGNOSIS — M47816 Spondylosis without myelopathy or radiculopathy, lumbar region: Secondary | ICD-10-CM | POA: Diagnosis not present

## 2023-02-09 DIAGNOSIS — M48061 Spinal stenosis, lumbar region without neurogenic claudication: Secondary | ICD-10-CM | POA: Diagnosis not present

## 2023-02-09 MED ORDER — GADOPICLENOL 0.5 MMOL/ML IV SOLN
7.5000 mL | Freq: Once | INTRAVENOUS | Status: AC | PRN
  Administered 2023-02-09: 7.5 mL via INTRAVENOUS

## 2023-02-27 DIAGNOSIS — H353211 Exudative age-related macular degeneration, right eye, with active choroidal neovascularization: Secondary | ICD-10-CM | POA: Diagnosis not present

## 2023-03-01 DIAGNOSIS — R7301 Impaired fasting glucose: Secondary | ICD-10-CM | POA: Diagnosis not present

## 2023-03-01 DIAGNOSIS — K219 Gastro-esophageal reflux disease without esophagitis: Secondary | ICD-10-CM | POA: Diagnosis not present

## 2023-03-01 DIAGNOSIS — D649 Anemia, unspecified: Secondary | ICD-10-CM | POA: Diagnosis not present

## 2023-03-01 DIAGNOSIS — E785 Hyperlipidemia, unspecified: Secondary | ICD-10-CM | POA: Diagnosis not present

## 2023-03-01 DIAGNOSIS — Z1212 Encounter for screening for malignant neoplasm of rectum: Secondary | ICD-10-CM | POA: Diagnosis not present

## 2023-03-01 DIAGNOSIS — I10 Essential (primary) hypertension: Secondary | ICD-10-CM | POA: Diagnosis not present

## 2023-03-08 DIAGNOSIS — D649 Anemia, unspecified: Secondary | ICD-10-CM | POA: Diagnosis not present

## 2023-03-08 DIAGNOSIS — E669 Obesity, unspecified: Secondary | ICD-10-CM | POA: Diagnosis not present

## 2023-03-08 DIAGNOSIS — R82998 Other abnormal findings in urine: Secondary | ICD-10-CM | POA: Diagnosis not present

## 2023-03-08 DIAGNOSIS — F32A Depression, unspecified: Secondary | ICD-10-CM | POA: Diagnosis not present

## 2023-03-08 DIAGNOSIS — Z79899 Other long term (current) drug therapy: Secondary | ICD-10-CM | POA: Diagnosis not present

## 2023-03-08 DIAGNOSIS — M4716 Other spondylosis with myelopathy, lumbar region: Secondary | ICD-10-CM | POA: Diagnosis not present

## 2023-03-08 DIAGNOSIS — I10 Essential (primary) hypertension: Secondary | ICD-10-CM | POA: Diagnosis not present

## 2023-03-08 DIAGNOSIS — I6529 Occlusion and stenosis of unspecified carotid artery: Secondary | ICD-10-CM | POA: Diagnosis not present

## 2023-03-08 DIAGNOSIS — Z0000 Encounter for general adult medical examination without abnormal findings: Secondary | ICD-10-CM | POA: Diagnosis not present

## 2023-03-08 DIAGNOSIS — Z1212 Encounter for screening for malignant neoplasm of rectum: Secondary | ICD-10-CM | POA: Diagnosis not present

## 2023-03-08 DIAGNOSIS — R7301 Impaired fasting glucose: Secondary | ICD-10-CM | POA: Diagnosis not present

## 2023-04-01 ENCOUNTER — Other Ambulatory Visit: Payer: Self-pay | Admitting: Physician Assistant

## 2023-04-02 DIAGNOSIS — R82998 Other abnormal findings in urine: Secondary | ICD-10-CM | POA: Diagnosis not present

## 2023-04-06 LAB — COLOGUARD

## 2023-04-09 DIAGNOSIS — M48062 Spinal stenosis, lumbar region with neurogenic claudication: Secondary | ICD-10-CM | POA: Diagnosis not present

## 2023-04-09 DIAGNOSIS — M5416 Radiculopathy, lumbar region: Secondary | ICD-10-CM | POA: Diagnosis not present

## 2023-04-10 DIAGNOSIS — H353211 Exudative age-related macular degeneration, right eye, with active choroidal neovascularization: Secondary | ICD-10-CM | POA: Diagnosis not present

## 2023-04-11 DIAGNOSIS — M79641 Pain in right hand: Secondary | ICD-10-CM | POA: Diagnosis not present

## 2023-04-11 DIAGNOSIS — Z006 Encounter for examination for normal comparison and control in clinical research program: Secondary | ICD-10-CM

## 2023-04-11 DIAGNOSIS — I10 Essential (primary) hypertension: Secondary | ICD-10-CM | POA: Diagnosis not present

## 2023-04-11 DIAGNOSIS — M79642 Pain in left hand: Secondary | ICD-10-CM | POA: Diagnosis not present

## 2023-04-11 MED ORDER — STUDY - VICTORION-1 PREVENT - INCLISIRAN 300 MG/1.5 ML OR PLACEBO SQ INJECTION (PI-STUCKEY)
300.0000 mg | INJECTION | SUBCUTANEOUS | Status: DC
  Administered 2023-04-11: 300 mg via SUBCUTANEOUS
  Filled 2023-04-11: qty 1.5

## 2023-04-11 NOTE — Research (Cosign Needed Addendum)
V1P Informed Consent  Protocol No. [id number] ICF Version number: 01.01.04 Re consent     Subject met inclusion and exclusion criteria.  The informed consent form, study requirements and expectations were reviewed with the subject and questions and concerns were addressed prior to the signing of the consent form.  The subject verbalized understanding of the trial requirements.  The subject agreed to participate in the Victorion 1 Prevent trial and signed the informed consent on 11-Apr-2023 at 0922.  Informed consent was obtained prior to performance of any protocol-specific procedures. A copy of the signed informed consent was given to the subject and original copy was placed in the subject's medical record.  V1P Study Drug Administration  Subject ID [id number] Kit [id number]  Administration Site: Right lower abd DATE: 11-Apr-2023 @0939    Visit: V3       Day: 270         Prior or concomitant medications reviewed [x]   Lipid lowering medications reviewed [x]   Surgical history reviewed [x]   TIME: BP: PULSE:  0907 137/81 68  0914 135/66 67  0922 132/78 65  Mean BP: 134/75  Mean Pulse:66  Labs collected at: 0938 Fasting Lipid Profile [x]    AE/SAE assessment completed [x]   Study drug administered [x]   Endpoint assessment completed [x]   Ms Stallone came in today for V3 of V1P research. She reports no med changes, no abd pain, and no visits to the ed or urgent care since seen last. She does report pain in feet that has been going on for about a year.  Pain in her hands now for 2 months. She states the pain wakes her up at night at times. She states no swelling.  She is seeing her PCP later today due to the pain. Scheduled next appointment for Jan 15 at 0900.

## 2023-05-07 DIAGNOSIS — Z1211 Encounter for screening for malignant neoplasm of colon: Secondary | ICD-10-CM | POA: Diagnosis not present

## 2023-05-13 LAB — COLOGUARD: COLOGUARD: NEGATIVE

## 2023-05-13 NOTE — Progress Notes (Unsigned)
Office Visit    Patient Name: [name] Date of Encounter: 05/13/2023  PCP:  Rodrigo Ran, MD   [facility]  Cardiologist:  Lance Muss, MD  Advanced Practice Provider:  No care team member to display Electrophysiologist:  None   HPI    [name] is a 67 y.o. female with a hx of hypertension, hypercholesterolemia, anxiety, and GERD presents today for follow-up.   She was last seen 04/22/2021.  Her history includes a stress test in 2017 with normal ejection fraction, normal wall motion, small defect of moderate severity present in the mid anterior septal location partially reversible.  This is thought to represent RV insertion point or a very small amount of localized ischemia possibly a small diagonal branch.  She was having some DOE with less activity.  She was also feeling her blood pressure was getting too low with readings in the 80s systolic.  Valsartan was decreased.  She saw me 04/11/2022, she feels pretty good.  She brings in a blood pressure log and has noticed some lower blood pressures with the systolic in the 80s.  This is in particularly when she is standing.  She has 5 instances where her blood pressure from sitting to standing drops more than 20 mmHg.  This is indicative of orthostatic hypotension.  We discussed getting enough fluid throughout the day and drinking at least 64 ounces of water.  We also discussed her previously scheduled coronary CT scan that Dr. Eldridge Dace wanted to perform last year.  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  She does have a strong family history of coronary artery disease and would like to know whether or not she has it.  She also has had issues with lower extremity edema but this has improved.  She has not tolerated compression hose in the past.  She was recently started on Zetia in addition to her statin for better cholesterol control.  We will try to obtain her last lipid panel through her  PCP.  Today, she***  Past Medical History    Past Medical History:  Diagnosis Date   Anxiety    Arthritis    Deviated septum    Dyspnea    with exertion   wheezing at night   Fibroid tumor    GERD (gastroesophageal reflux disease) 11/02/2015   Hepatitis    AS CHILD     Hypercholesteremia 07/21/2015   Hypertension 07/10/2015   Memory loss    Meniscus tear    PONV (postoperative nausea and vomiting)    Restless leg syndrome 09/01/2015   Retinal detachment 03/06/2001   Spinal stenosis    Urinary frequency    Past Surgical History:  Procedure Laterality Date   ANTERIOR CERVICAL DECOMP/DISCECTOMY FUSION N/A 11/02/2015   Procedure: ANTERIOR CERVICAL DECOMPRESSION FUSION CERVICAL FIVE-SIX,CERVICAL SIX-SEVEN;  Surgeon: Aliene Beams, MD;  Location: [facility];  Service: Neurosurgery;  Laterality: N/A;  right side approach   CATARACT EXTRACTION Right    fibroid tumor     KNEE SURGERY Bilateral    RETINAL DETACHMENT SURGERY Right 03/06/2001   TOTAL KNEE ARTHROPLASTY Right 11/08/2017   TOTAL KNEE ARTHROPLASTY Right 11/08/2017   Procedure: RIGHT TOTAL KNEE ARTHROPLASTY;  Surgeon: Tarry Kos, MD;  Location: [facility] OR;  Service: Orthopedics;  Laterality: Right;    Allergies  No Known Allergies  EKGs/Labs/Other Studies Reviewed:   The following studies were reviewed today:  Echocardiogram 04/19/16  LV EF:  60% -   65%   -------------------------------------------------------------------  Indications:      R06.00 Dyspnea.   -------------------------------------------------------------------  History:   PMH:  Acquired from the patient and from the patient&'s  chart.  Risk factors:  Hypertension.  Hypercholesterolemia.   -------------------------------------------------------------------  Study Conclusions   - Left ventricle: The cavity size was normal. There was mild    concentric hypertrophy. Systolic function was normal. The    estimated ejection fraction was in the  range of 60% to 65%. Wall    motion was normal; there were no regional wall motion    abnormalities. Left ventricular diastolic function parameters    were normal.  - Atrial septum: No defect or patent foramen ovale was identified.   Carotid US 11/21/2019 Summary:  Right Carotid: Velocities in the right ICA are consistent with a 1-39%  stenosis.   Left Carotid: Velocities in the left ICA are consistent with a 1-39%  stenosis.                 Elevated velocities without the presence of disease observed  in               the distal ICA.   Vertebrals:  Bilateral vertebral arteries demonstrate antegrade flow.  Subclavians: Normal flow hemodynamics were seen in bilateral subclavian               arteries.   EKG:  EKG is ordered today.  The ekg ordered today demonstrates NSR rate 64 bpm  Recent Labs: No results found for requested labs within last 365 days.  Recent Lipid Panel No results found for: "CHOL", "TRIG", "HDL", "CHOLHDL", "VLDL", "LDLCALC", "LDLDIRECT" Home Medications   No outpatient medications have been marked as taking for the 05/14/23 encounter (Appointment) with Sharlene Dory, PA-C.   Current Facility-Administered Medications for the 05/14/23 encounter (Appointment) with Sharlene Dory, PA-C  Medication   Study - VICTORION-1 PREVENT - inclisiran 300 mg/1.4mL or placebo SQ injection (PI-Stuckey)     Review of Systems      All other systems reviewed and are otherwise negative except as noted above.  Physical Exam    VS:  There were no vitals taken for this visit. , BMI There is no height or weight on file to calculate BMI.  Wt Readings from Last 3 Encounters:  10/18/22 167 lb 6.4 oz (75.9 kg)  07/18/22 168 lb 8 oz (76.4 kg)  07/04/22 171 lb (77.6 kg)     GEN: Well nourished, well developed, in no acute distress. HEENT: normal. Neck: Supple, no JVD, carotid bruits, or masses. Cardiac: RRR, no murmurs, rubs, or gallops. No clubbing, cyanosis, edema.   Radials/PT 2+ and equal bilaterally.  Respiratory:  Respirations regular and unlabored, clear to auscultation bilaterally. GI: Soft, nontender, nondistended. MS: No deformity or atrophy. Skin: Warm and dry, no rash. Neuro:  Strength and sensation are intact. Psych: Normal affect.  Assessment & Plan     Dyspnea on exertion -with strong family history will plan on coronary CTA -Most recent lipid panel LDL 112. She was started on Zetia by her primary. Will try to obtain recent Lipid panel and if not one will order  Hypertension/hypotension -Today, she brings in her blood pressure Month or so she has had Episodes where her blood pressure drops more than 20 Sitting to standing.  She states that her hydration has not noticed but has been better than it was in the past.  She has been to elastic therapy  and trying to use compression but has not been very successful.  She states they are way too tight.  She is tolerating her chlorthalidone by taking it every other day.  Her weight has been pretty stable and she is even lost about 4 pounds in the last month. -Blood pressure today in the clinic was 116/68 -She was taken off of valsartan and we will decrease her Coreg today to 12.5 mg twice daily  Lower extremity edema -Better than it had been per patient -Cannot tolerate lower extremity compression.  She has tried several different compression socks/stockings -Continue chlorthalidone every other day     Disposition: Follow up 1 year with Lance Muss, MD or APP.  Signed, Sharlene Dory, PA-C 05/13/2023, 9:38 PM  [facility]

## 2023-05-14 ENCOUNTER — Ambulatory Visit: Payer: Medicare PPO | Attending: Physician Assistant | Admitting: Physician Assistant

## 2023-05-14 ENCOUNTER — Encounter: Payer: Self-pay | Admitting: Physician Assistant

## 2023-05-14 VITALS — BP 106/62 | HR 64 | Ht 66.0 in | Wt 163.0 lb

## 2023-05-14 DIAGNOSIS — I10 Essential (primary) hypertension: Secondary | ICD-10-CM

## 2023-05-14 DIAGNOSIS — I959 Hypotension, unspecified: Secondary | ICD-10-CM | POA: Diagnosis not present

## 2023-05-14 DIAGNOSIS — Z79899 Other long term (current) drug therapy: Secondary | ICD-10-CM | POA: Diagnosis not present

## 2023-05-14 DIAGNOSIS — R600 Localized edema: Secondary | ICD-10-CM

## 2023-05-14 DIAGNOSIS — R0609 Other forms of dyspnea: Secondary | ICD-10-CM | POA: Diagnosis not present

## 2023-05-14 MED ORDER — CARVEDILOL 3.125 MG PO TABS: 3.1250 mg | ORAL_TABLET | Freq: Two times a day (BID) | ORAL | 2 refills | Status: DC

## 2023-05-14 NOTE — Patient Instructions (Addendum)
Medication Instructions:    START TAKING:  3.125 MG TWICE A DAY   *If you need a refill on your cardiac medications before your next appointment, please call your pharmacy*   Lab Work: BMET TODAY    If you have labs (blood work) drawn today and your tests are completely normal, you will receive your results only by: MyChart Message (if you have MyChart) OR A paper copy in the mail If you have any lab test that is abnormal or we need to change your treatment, we will call you to review the results.   Testing/Procedures: NONE ORDERED  TODAY    Follow-Up: At [facility], you and your health needs are our priority.  As part of our continuing mission to provide you with exceptional heart care, we have created designated Provider Care Teams.  These Care Teams include your primary Cardiologist (physician) and Advanced Practice Providers (APPs -  Physician Assistants and Nurse Practitioners) who all work together to provide you with the care you need, when you need it.  We recommend signing up for the patient portal called "MyChart".  Sign up information is provided on this After Visit Summary.  MyChart is used to connect with patients for Virtual Visits (Telemedicine).  Patients are able to view lab/test results, encounter notes, upcoming appointments, etc.  Non-urgent messages can be sent to your provider as well.   To learn more about what you can do with MyChart, go to [email or web address].    Your next appointment:    1 year(s)  Provider:      Other Instructions  Low-Sodium Eating Plan Salt (sodium) helps you keep a healthy balance of fluids in your body. Too much sodium can raise your blood pressure. It can also cause fluid and waste to be held in your body. Your health care provider or dietitian may recommend a low-sodium eating plan if you have high blood pressure (hypertension), kidney disease, liver disease, or heart failure. Eating less sodium can help lower  your blood pressure and reduce swelling. It can also protect your heart, liver, and kidneys. What are tips for following this plan? Reading food labels  Check food labels for the amount of sodium per serving. If you eat more than one serving, you must multiply the listed amount by the number of servings. Choose foods with less than 140 milligrams (mg) of sodium per serving. Avoid foods with 300 mg of sodium or more per serving. Always check how much sodium is in a product, even if the label says "unsalted" or "no salt added." Shopping  Buy products labeled as "low-sodium" or "no salt added." Buy fresh foods. Avoid canned foods and pre-made or frozen meals. Avoid canned, cured, or processed meats. Buy breads that have less than 80 mg of sodium per slice. Cooking  Eat more home-cooked food. Try to eat less restaurant, buffet, and fast food. Try not to add salt when you cook. Use salt-free seasonings or herbs instead of table salt or sea salt. Check with your provider or pharmacist before using salt substitutes. Cook with plant-based oils, such as canola, sunflower, or olive oil. Meal planning When eating at a restaurant, ask if your food can be made with less salt or no salt. Avoid dishes labeled as brined, pickled, cured, or smoked. Avoid dishes made with soy sauce, miso, or teriyaki sauce. Avoid foods that have monosodium glutamate (MSG) in them. MSG may be added to some restaurant food, sauces, soups, bouillon, and canned foods. Make meals  that can be grilled, baked, poached, roasted, or steamed. These are often made with less sodium. General information Try to limit your sodium intake to 1,500-2,300 mg each day, or the amount told by your provider. What foods should I eat? Fruits Fresh, frozen, or canned fruit. Fruit juice. Vegetables Fresh or frozen vegetables. "No salt added" canned vegetables. "No salt added" tomato sauce and paste. Low-sodium or reduced-sodium tomato and vegetable  juice. Grains Low-sodium cereals, such as oats, puffed wheat and rice, and shredded wheat. Low-sodium crackers. Unsalted rice. Unsalted pasta. Low-sodium bread. Whole grain breads and whole grain pasta. Meats and other proteins Fresh or frozen meat, poultry, seafood, and fish. These should have no added salt. Low-sodium canned tuna and salmon. Unsalted nuts. Dried peas, beans, and lentils without added salt. Unsalted canned beans. Eggs. Unsalted nut butters. Dairy Milk. Soy milk. Cheese that is naturally low in sodium, such as ricotta cheese, fresh mozzarella, or Swiss cheese. Low-sodium or reduced-sodium cheese. Cream cheese. Yogurt. Seasonings and condiments Fresh and dried herbs and spices. Salt-free seasonings. Low-sodium mustard and ketchup. Sodium-free salad dressing. Sodium-free light mayonnaise. Fresh or refrigerated horseradish. Lemon juice. Vinegar. Other foods Homemade, reduced-sodium, or low-sodium soups. Unsalted popcorn and pretzels. Low-salt or salt-free chips. The items listed above may not be all the foods and drinks you can have. Talk to a dietitian to learn more. What foods should I avoid? Vegetables Sauerkraut, pickled vegetables, and relishes. Olives. [place] fries. Onion rings. Regular canned vegetables, except low-sodium or reduced-sodium items. Regular canned tomato sauce and paste. Regular tomato and vegetable juice. Frozen vegetables in sauces. Grains Instant hot cereals. Bread stuffing, pancake, and biscuit mixes. Croutons. Seasoned rice or pasta mixes. Noodle soup cups. Boxed or frozen macaroni and cheese. Regular salted crackers. Self-rising flour. Meats and other proteins Meat or fish that is salted, canned, smoked, spiced, or pickled. Precooked or cured meat, such as sausages or meat loaves. [name]. Ham. Pepperoni. Hot dogs. Corned beef. Chipped beef. Salt pork. Jerky. Pickled herring, anchovies, and sardines. Regular canned tuna. Salted nuts. Dairy Processed cheese  and cheese spreads. Hard cheeses. Cheese curds. Blue cheese. Feta cheese. String cheese. Regular cottage cheese. Buttermilk. Canned milk. Fats and oils Salted butter. Regular margarine. Ghee. Bacon fat. Seasonings and condiments Onion salt, garlic salt, seasoned salt, table salt, and sea salt. Canned and packaged gravies. Worcestershire sauce. Tartar sauce. Barbecue sauce. Teriyaki sauce. Soy sauce, including reduced-sodium soy sauce. Steak sauce. Fish sauce. Oyster sauce. Cocktail sauce. Horseradish that you find on the shelf. Regular ketchup and mustard. Meat flavorings and tenderizers. Bouillon cubes. Hot sauce. Pre-made or packaged marinades. Pre-made or packaged taco seasonings. Relishes. Regular salad dressings. Salsa. Other foods Salted popcorn and pretzels. Corn chips and puffs. Potato and tortilla chips. Canned or dried soups. Pizza. Frozen entrees and pot pies. The items listed above may not be all the foods and drinks you should avoid. Talk to a dietitian to learn more. This information is not intended to replace advice given to you by your health care provider. Make sure you discuss any questions you have with your health care provider. Document Revised: 09/28/2022 Document Reviewed: 09/28/2022 Elsevier Patient Education  2024 Elsevier Inc.  Heart-Healthy Eating Plan Eating a healthy diet is important for the health of your heart. A heart-healthy eating plan includes: Eating less unhealthy fats. Eating more healthy fats. Eating less salt in your food. Salt is also called sodium. Making other changes in your diet. Talk with your doctor or a diet specialist (dietitian) to  create an eating plan that is right for you. What is my plan? Your doctor may recommend an eating plan that includes: Total fat: ______% or less of total calories a day. Saturated fat: ______% or less of total calories a day. Cholesterol: less than _________mg a day. Sodium: less than _________mg a day. What are  tips for following this plan? Cooking Avoid frying your food. Try to bake, boil, grill, or broil it instead. You can also reduce fat by: Removing the skin from poultry. Removing all visible fats from meats. Steaming vegetables in water or broth. Meal planning  At meals, divide your plate into four equal parts: Fill one-half of your plate with vegetables and green salads. Fill one-fourth of your plate with whole grains. Fill one-fourth of your plate with lean protein foods. Eat 2-4 cups of vegetables per day. One cup of vegetables is: 1 cup (91 g) broccoli or cauliflower florets. 2 medium carrots. 1 large bell pepper. 1 large sweet potato. 1 large tomato. 1 medium white potato. 2 cups (150 g) raw leafy greens. Eat 1-2 cups of fruit per day. One cup of fruit is: 1 small apple 1 large banana 1 cup (237 g) mixed fruit, 1 large orange,  cup (82 g) dried fruit, 1 cup (240 mL) 100% fruit juice. Eat more foods that have soluble fiber. These are apples, broccoli, carrots, beans, peas, and barley. Try to get 20-30 g of fiber per day. Eat 4-5 servings of nuts, legumes, and seeds per week: 1 serving of dried beans or legumes equals  cup (90 g) cooked. 1 serving of nuts is  oz (12 almonds, 24 pistachios, or 7 walnut halves). 1 serving of seeds equals  oz (8 g). General information Eat more home-cooked food. Eat less restaurant, buffet, and fast food. Limit or avoid alcohol. Limit foods that are high in starch and sugar. Avoid fried foods. Lose weight if you are overweight. Keep track of how much salt (sodium) you eat. This is important if you have high blood pressure. Ask your doctor to tell you more about this. Try to add vegetarian meals each week. Fats Choose healthy fats. These include olive oil and canola oil, flaxseeds, walnuts, almonds, and seeds. Eat more omega-3 fats. These include salmon, mackerel, sardines, tuna, flaxseed oil, and ground flaxseeds. Try to eat fish at  least 2 times each week. Check food labels. Avoid foods with trans fats or high amounts of saturated fat. Limit saturated fats. These are often found in animal products, such as meats, butter, and cream. These are also found in plant foods, such as palm oil, palm kernel oil, and coconut oil. Avoid foods with partially hydrogenated oils in them. These have trans fats. Examples are stick margarine, some tub margarines, cookies, crackers, and other baked goods. What foods should I eat? Fruits All fresh, canned (in natural juice), or frozen fruits. Vegetables Fresh or frozen vegetables (raw, steamed, roasted, or grilled). Green salads. Grains Most grains. Choose whole wheat and whole grains most of the time. Rice and pasta, including brown rice and pastas made with whole wheat. Meats and other proteins Lean, well-trimmed beef, veal, pork, and lamb. Chicken and [place] without skin. All fish and shellfish. Wild duck, rabbit, pheasant, and venison. Egg whites or low-cholesterol egg substitutes. Dried beans, peas, lentils, and tofu. Seeds and most nuts. Dairy Low-fat or nonfat cheeses, including ricotta and mozzarella. Skim or 1% milk that is liquid, powdered, or evaporated. Buttermilk that is made with low-fat milk. Nonfat or  low-fat yogurt. Fats and oils Non-hydrogenated (trans-free) margarines. Vegetable oils, including soybean, sesame, sunflower, olive, peanut, safflower, corn, canola, and cottonseed. Salad dressings or mayonnaise made with a vegetable oil. Beverages Mineral water. Coffee and tea. Diet carbonated beverages. Sweets and desserts Sherbet, gelatin, and fruit ice. Small amounts of dark chocolate. Limit all sweets and desserts. Seasonings and condiments All seasonings and condiments. The items listed above may not be a complete list of foods and drinks you can eat. Contact a dietitian for more options. What foods should I avoid? Fruits Canned fruit in heavy syrup. Fruit in cream  or butter sauce. Fried fruit. Limit coconut. Vegetables Vegetables cooked in cheese, cream, or butter sauce. Fried vegetables. Grains Breads that are made with saturated or trans fats, oils, or whole milk. Croissants. Sweet rolls. Donuts. High-fat crackers, such as cheese crackers. Meats and other proteins Fatty meats, such as hot dogs, ribs, sausage, bacon, rib-eye roast or steak. High-fat deli meats, such as salami and bologna. Caviar. Domestic duck and goose. Organ meats, such as liver. Dairy Cream, sour cream, cream cheese, and creamed cottage cheese. Whole-milk cheeses. Whole or 2% milk that is liquid, evaporated, or condensed. Whole buttermilk. Cream sauce or high-fat cheese sauce. Yogurt that is made from whole milk. Fats and oils Meat fat, or shortening. Cocoa butter, hydrogenated oils, palm oil, coconut oil, palm kernel oil. Solid fats and shortenings, including bacon fat, salt pork, lard, and butter. Nondairy cream substitutes. Salad dressings with cheese or sour cream. Beverages Regular sodas and juice drinks with added sugar. Sweets and desserts Frosting. Pudding. Cookies. Cakes. Pies. Milk chocolate or white chocolate. Buttered syrups. Full-fat ice cream or ice cream drinks. The items listed above may not be a complete list of foods and drinks to avoid. Contact a dietitian for more information. Summary Heart-healthy meal planning includes eating less unhealthy fats, eating more healthy fats, and making other changes in your diet. Eat a balanced diet. This includes fruits and vegetables, low-fat or nonfat dairy, lean protein, nuts and legumes, whole grains, and heart-healthy oils and fats. This information is not intended to replace advice given to you by your health care provider. Make sure you discuss any questions you have with your health care provider. Document Revised: 10/17/2021 Document Reviewed: 10/17/2021 Elsevier Patient Education  2024 [employer].

## 2023-05-15 LAB — BASIC METABOLIC PANEL
BUN/Creatinine Ratio: 22 (ref 12–28)
BUN: 19 mg/dL (ref 8–27)
CO2: 28 mmol/L (ref 20–29)
Calcium: 10.3 mg/dL (ref 8.7–10.3)
Chloride: 99 mmol/L (ref 96–106)
Creatinine, Ser: 0.88 mg/dL (ref 0.57–1.00)
Glucose: 106 mg/dL — ABNORMAL HIGH (ref 70–99)
Potassium: 3.5 mmol/L (ref 3.5–5.2)
Sodium: 141 mmol/L (ref 134–144)
eGFR: 71 mL/min/{1.73_m2} (ref >59)

## 2023-05-22 DIAGNOSIS — H353211 Exudative age-related macular degeneration, right eye, with active choroidal neovascularization: Secondary | ICD-10-CM | POA: Diagnosis not present

## 2023-06-19 ENCOUNTER — Other Ambulatory Visit (INDEPENDENT_AMBULATORY_CARE_PROVIDER_SITE_OTHER): Payer: Medicare PPO

## 2023-06-19 ENCOUNTER — Ambulatory Visit: Payer: Medicare PPO | Admitting: Orthopaedic Surgery

## 2023-06-19 DIAGNOSIS — G8929 Other chronic pain: Secondary | ICD-10-CM | POA: Diagnosis not present

## 2023-06-19 DIAGNOSIS — M25562 Pain in left knee: Secondary | ICD-10-CM

## 2023-06-19 MED ORDER — LIDOCAINE HCL 1 % IJ SOLN
2.0000 mL | INTRAMUSCULAR | Status: AC | PRN
  Administered 2023-06-19: 2 mL

## 2023-06-19 MED ORDER — BUPIVACAINE HCL 0.5 % IJ SOLN
2.0000 mL | INTRAMUSCULAR | Status: AC | PRN
  Administered 2023-06-19: 2 mL via INTRA_ARTICULAR

## 2023-06-19 MED ORDER — METHYLPREDNISOLONE ACETATE 40 MG/ML IJ SUSP
40.0000 mg | INTRAMUSCULAR | Status: AC | PRN
  Administered 2023-06-19: 40 mg via INTRA_ARTICULAR

## 2023-06-19 NOTE — Progress Notes (Signed)
Office Visit Note   Patient: [name]           Date of Birth: January 22, 1956           MRN: [id number] Visit Date: 06/19/2023              Requested by: Rodrigo Ran, MD [address] [place],  [place] [address] PCP: Rodrigo Ran, MD   Assessment & Plan: Visit Diagnoses:  1. Chronic pain of left knee     Plan: Lareine is a 67 year old female with left knee pain and left lower leg pain.  Difficult to discern whether this is coming from the knee or lumbar spine.  She does have known degenerative disease in the lumbar spine.  We will try cortisone injection in the left knee today and see how she responds to this over the next week or 2.  If this does not improve her symptoms she should follow-up with Dr. Lorrine Miller for further evaluation and treatment of her lumbar spine.  Follow-Up Instructions: No follow-ups on file.   Orders:  Orders Placed This Encounter  Procedures   Large Joint Inj   XR KNEE 3 VIEW LEFT   No orders of the defined types were placed in this encounter.     Procedures: Large Joint Inj: L knee on 06/19/2023 9:24 AM Details: 22 G needle Medications: 2 mL bupivacaine 0.5 %; 2 mL lidocaine 1 %; 40 mg methylPREDNISolone acetate 40 MG/ML Outcome: tolerated well, no immediate complications Patient was prepped and draped in the usual sterile fashion.       Clinical Data: No additional findings.   Subjective: Chief Complaint  Patient presents with   Left Knee - Pain    HPI [name] comes in today for evaluation of increased left knee pain for a month.  Denies any injuries recently.  Feels pain around the knee and down the lateral side of the entire leg.  Denies any mechanical symptoms. Review of Systems  Constitutional: Negative.   HENT: Negative.    Eyes: Negative.   Respiratory: Negative.    Cardiovascular: Negative.   Endocrine: Negative.   Musculoskeletal: Negative.   Neurological: Negative.   Hematological: Negative.   Psychiatric/Behavioral:  Negative.    All other systems reviewed and are negative.    Objective: Vital Signs: There were no vitals taken for this visit.  Physical Exam Vitals and nursing note reviewed.  Constitutional:      Appearance: She is well-developed.  HENT:     Head: Normocephalic and atraumatic.  Pulmonary:     Effort: Pulmonary effort is normal.  Abdominal:     Palpations: Abdomen is soft.  Musculoskeletal:     Cervical back: Neck supple.  Skin:    General: Skin is warm.     Capillary Refill: Capillary refill takes less than 2 seconds.  Neurological:     Mental Status: She is alert and oriented to person, place, and time.  Psychiatric:        Behavior: Behavior normal.        Thought Content: Thought content normal.        Judgment: Judgment normal.     Ortho Exam Examination left knee shows slight varus alignment.  Pain and crepitus throughout range of motion.  Medial joint line tenderness.  No joint effusion. Specialty Comments:  No specialty comments available.  Imaging: XR KNEE 3 VIEW LEFT  Result Date: 06/19/2023 X-rays of the left knee show advanced tricompartmental degenerative joint disease.  Bone-on-bone joint  space narrowing.  Varus deformity.    PMFS History: Patient Active Problem List   Diagnosis Date Noted   Plantar fasciitis of right foot 03/25/2020   Primary osteoarthritis of left knee 11/11/2019   Chronic right shoulder pain 05/09/2018   Exudative age-related macular degeneration of right eye with active choroidal neovascularization (HCC) 02/02/2018   Status post right knee replacement 11/08/2017   Sensorineural hearing loss (SNHL), bilateral 10/11/2017   Benign paroxysmal positional vertigo of right ear 10/03/2017   Vertigo 09/25/2017   GERD (gastroesophageal reflux disease) 09/25/2017   HLD (hyperlipidemia) 09/25/2017   Obesity 09/25/2017   Intractable vomiting with nausea    Memory loss 05/21/2017   Acute low back pain with sciatica 12/08/2016    Posterior vitreous detachment, bilateral 08/15/2016   Lumbar radiculopathy 12/07/2015   Abnormality of gait 12/07/2015   High risk medication use 11/09/2015   Cervical stenosis of spinal canal 11/02/2015   Low back pain 10/19/2015   Spinal stenosis in cervical region 10/19/2015   Bilateral hip pain 10/19/2015   Cervical spondylosis without myelopathy 08/10/2015   Excessive daytime sleepiness 08/10/2015   Right optic neuritis 08/10/2015   Abnormal finding on MRI of brain 08/10/2015   Cataract of left eye 08/03/2015   Detached retina 08/03/2015   Carotid artery narrowing 07/21/2015   Abnormal glucose level 07/21/2015   Hypertensive retinopathy 07/19/2015   Edema, retina 07/19/2015   Optic neuritis 07/10/2015   Hypertension 07/10/2015   Arthritis 07/10/2015   Chest pain 01/21/2015   Leg pain 01/21/2015   Pseudoaphakia 12/22/2013   Congenital anomaly of optic nerve (HCC) 12/12/2011   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   Blepharitis 12/12/2011   Error, refractive, myopia 12/12/2011   Posterior vitreous detachment 12/12/2011   Myopia with astigmatism and presbyopia, bilateral 12/12/2011   Past Medical History:  Diagnosis Date   Anxiety    Arthritis    Deviated septum    Dyspnea    with exertion   wheezing at night   Fibroid tumor    GERD (gastroesophageal reflux disease) 11/02/2015   Hepatitis    AS CHILD     Hypercholesteremia 07/21/2015   Hypertension 07/10/2015   Memory loss    Meniscus tear    PONV (postoperative nausea and vomiting)    Restless leg syndrome 09/01/2015   Retinal detachment 03/06/2001   Spinal stenosis    Urinary frequency     Family History  Problem Relation Age of Onset   Stroke Mother    Arrhythmia Mother    Bladder Cancer Mother    Thyroid disease Mother    Heart disease Father    Heart attack Father    Stroke Maternal Grandmother    Stroke Maternal Grandfather    Breast cancer Neg Hx     Past Surgical History:  Procedure Laterality Date    ANTERIOR CERVICAL DECOMP/DISCECTOMY FUSION N/A 11/02/2015   Procedure: ANTERIOR CERVICAL DECOMPRESSION FUSION CERVICAL FIVE-SIX,CERVICAL SIX-SEVEN;  Surgeon: Aliene Beams, MD;  Location: [facility];  Service: Neurosurgery;  Laterality: N/A;  right side approach   CATARACT EXTRACTION Right    fibroid tumor     KNEE SURGERY Bilateral    RETINAL DETACHMENT SURGERY Right 03/06/2001   TOTAL KNEE ARTHROPLASTY Right 11/08/2017   TOTAL KNEE ARTHROPLASTY Right 11/08/2017   Procedure: RIGHT TOTAL KNEE ARTHROPLASTY;  Surgeon: Tarry Kos, MD;  Location: [facility] OR;  Service: Orthopedics;  Laterality: Right;   Social History   Occupational History   Occupation: caregiver    Comment: Part-time  Tobacco Use   Smoking status: Never   Smokeless tobacco: Never  Vaping Use   Vaping status: Never Used  Substance and Sexual Activity   Alcohol use: No   Drug use: No   Sexual activity: Not on file

## 2023-07-03 DIAGNOSIS — H353211 Exudative age-related macular degeneration, right eye, with active choroidal neovascularization: Secondary | ICD-10-CM | POA: Diagnosis not present

## 2023-07-11 DIAGNOSIS — M48062 Spinal stenosis, lumbar region with neurogenic claudication: Secondary | ICD-10-CM | POA: Diagnosis not present

## 2023-07-30 ENCOUNTER — Other Ambulatory Visit: Payer: Self-pay | Admitting: Internal Medicine

## 2023-07-30 DIAGNOSIS — Z1231 Encounter for screening mammogram for malignant neoplasm of breast: Secondary | ICD-10-CM

## 2023-08-03 DIAGNOSIS — J069 Acute upper respiratory infection, unspecified: Secondary | ICD-10-CM | POA: Diagnosis not present

## 2023-08-03 DIAGNOSIS — I10 Essential (primary) hypertension: Secondary | ICD-10-CM | POA: Diagnosis not present

## 2023-08-03 DIAGNOSIS — R051 Acute cough: Secondary | ICD-10-CM | POA: Diagnosis not present

## 2023-08-14 DIAGNOSIS — H353211 Exudative age-related macular degeneration, right eye, with active choroidal neovascularization: Secondary | ICD-10-CM | POA: Diagnosis not present

## 2023-08-16 DIAGNOSIS — M5416 Radiculopathy, lumbar region: Secondary | ICD-10-CM | POA: Diagnosis not present

## 2023-08-16 DIAGNOSIS — M48062 Spinal stenosis, lumbar region with neurogenic claudication: Secondary | ICD-10-CM | POA: Diagnosis not present

## 2023-08-16 DIAGNOSIS — Z6827 Body mass index (BMI) 27.0-27.9, adult: Secondary | ICD-10-CM | POA: Diagnosis not present

## 2023-09-04 DIAGNOSIS — H469 Unspecified optic neuritis: Secondary | ICD-10-CM | POA: Diagnosis not present

## 2023-09-04 DIAGNOSIS — H35063 Retinal vasculitis, bilateral: Secondary | ICD-10-CM | POA: Diagnosis not present

## 2023-09-04 DIAGNOSIS — H532 Diplopia: Secondary | ICD-10-CM | POA: Diagnosis not present

## 2023-09-04 DIAGNOSIS — H5712 Ocular pain, left eye: Secondary | ICD-10-CM | POA: Diagnosis not present

## 2023-09-04 DIAGNOSIS — Z79899 Other long term (current) drug therapy: Secondary | ICD-10-CM | POA: Diagnosis not present

## 2023-09-04 DIAGNOSIS — Z961 Presence of intraocular lens: Secondary | ICD-10-CM | POA: Diagnosis not present

## 2023-09-04 DIAGNOSIS — H35033 Hypertensive retinopathy, bilateral: Secondary | ICD-10-CM | POA: Diagnosis not present

## 2023-09-04 DIAGNOSIS — I10 Essential (primary) hypertension: Secondary | ICD-10-CM | POA: Diagnosis not present

## 2023-09-04 DIAGNOSIS — H3321 Serous retinal detachment, right eye: Secondary | ICD-10-CM | POA: Diagnosis not present

## 2023-09-04 DIAGNOSIS — H538 Other visual disturbances: Secondary | ICD-10-CM | POA: Diagnosis not present

## 2023-09-04 DIAGNOSIS — H353211 Exudative age-related macular degeneration, right eye, with active choroidal neovascularization: Secondary | ICD-10-CM | POA: Diagnosis not present

## 2023-09-05 ENCOUNTER — Ambulatory Visit: Payer: Medicare PPO

## 2023-09-05 DIAGNOSIS — H469 Unspecified optic neuritis: Secondary | ICD-10-CM | POA: Diagnosis not present

## 2023-09-05 DIAGNOSIS — H35063 Retinal vasculitis, bilateral: Secondary | ICD-10-CM | POA: Diagnosis not present

## 2023-09-05 DIAGNOSIS — H532 Diplopia: Secondary | ICD-10-CM | POA: Diagnosis not present

## 2023-09-14 DIAGNOSIS — H3321 Serous retinal detachment, right eye: Secondary | ICD-10-CM | POA: Diagnosis not present

## 2023-09-14 DIAGNOSIS — H353211 Exudative age-related macular degeneration, right eye, with active choroidal neovascularization: Secondary | ICD-10-CM | POA: Diagnosis not present

## 2023-09-14 DIAGNOSIS — Z79899 Other long term (current) drug therapy: Secondary | ICD-10-CM | POA: Diagnosis not present

## 2023-09-14 DIAGNOSIS — H532 Diplopia: Secondary | ICD-10-CM | POA: Diagnosis not present

## 2023-09-14 DIAGNOSIS — H35063 Retinal vasculitis, bilateral: Secondary | ICD-10-CM | POA: Diagnosis not present

## 2023-09-14 DIAGNOSIS — Z961 Presence of intraocular lens: Secondary | ICD-10-CM | POA: Diagnosis not present

## 2023-09-14 DIAGNOSIS — H35033 Hypertensive retinopathy, bilateral: Secondary | ICD-10-CM | POA: Diagnosis not present

## 2023-09-14 DIAGNOSIS — H469 Unspecified optic neuritis: Secondary | ICD-10-CM | POA: Diagnosis not present

## 2023-10-04 DIAGNOSIS — Z1231 Encounter for screening mammogram for malignant neoplasm of breast: Secondary | ICD-10-CM

## 2023-10-08 ENCOUNTER — Ambulatory Visit: Payer: Medicare PPO

## 2023-10-09 DIAGNOSIS — H353211 Exudative age-related macular degeneration, right eye, with active choroidal neovascularization: Secondary | ICD-10-CM | POA: Diagnosis not present

## 2023-10-10 ENCOUNTER — Encounter: Payer: Medicare PPO | Admitting: *Deleted

## 2023-10-10 DIAGNOSIS — Z006 Encounter for examination for normal comparison and control in clinical research program: Secondary | ICD-10-CM

## 2023-10-10 MED ORDER — STUDY - VICTORION-1 PREVENT - INCLISIRAN 300 MG/1.5 ML OR PLACEBO SQ INJECTION (PI-CHRISTOPHER)
300.0000 mg | INJECTION | SUBCUTANEOUS | Status: DC
  Administered 2023-10-10: 300 mg via SUBCUTANEOUS
  Filled 2023-10-10: qty 1.5

## 2023-10-10 NOTE — Research (Addendum)
DATE: 10-Oct-2023  SUBJECT ID: [id number]   Visit: 4     Prior or concomitant medications reviewed [x]   Lipid lowering medications reviewed [x]   Surgical history reviewed [x]   TIME: BP: PULSE:  0913 135/77 68  0918 140/81 69  0922 144/87 66  Mean BP: 139/81   Mean Pulse: 67  Labs collected at: 0928 Fasting Lipid Profile [x]  Fasting Lp(a) [x]  Liver function tests []  Exploratory biomarkers []   AE/SAE assessment completed [x]   Study drug administered [x]   Endpoint assessment completed [x]   Ms Rehl is here for V4 of Victorion -1Prevent. She reports no abd pain, no changes in her meds, and no visits to the Ed or Urgent care. She did have a retinal injection to her right eye 10-09-2023 by Dr Sherryll Burger. Today's Injection given in left lower abd tol well kit number [id number].

## 2023-10-15 DIAGNOSIS — M5416 Radiculopathy, lumbar region: Secondary | ICD-10-CM | POA: Diagnosis not present

## 2023-10-18 DIAGNOSIS — H43812 Vitreous degeneration, left eye: Secondary | ICD-10-CM | POA: Diagnosis not present

## 2023-10-18 DIAGNOSIS — H353211 Exudative age-related macular degeneration, right eye, with active choroidal neovascularization: Secondary | ICD-10-CM | POA: Diagnosis not present

## 2023-10-18 DIAGNOSIS — H469 Unspecified optic neuritis: Secondary | ICD-10-CM | POA: Diagnosis not present

## 2023-10-18 DIAGNOSIS — Z961 Presence of intraocular lens: Secondary | ICD-10-CM | POA: Diagnosis not present

## 2023-10-24 ENCOUNTER — Ambulatory Visit
Admission: RE | Admit: 2023-10-24 | Discharge: 2023-10-24 | Disposition: A | Discharge status: Home / Self Care | Payer: Medicare PPO | Source: Ambulatory Visit | Attending: Internal Medicine | Admitting: Internal Medicine

## 2023-10-24 DIAGNOSIS — Z1231 Encounter for screening mammogram for malignant neoplasm of breast: Secondary | ICD-10-CM | POA: Diagnosis not present

## 2023-10-26 ENCOUNTER — Other Ambulatory Visit: Payer: Self-pay | Admitting: Internal Medicine

## 2023-10-26 DIAGNOSIS — R928 Other abnormal and inconclusive findings on diagnostic imaging of breast: Secondary | ICD-10-CM

## 2023-11-20 DIAGNOSIS — H35063 Retinal vasculitis, bilateral: Secondary | ICD-10-CM | POA: Diagnosis not present

## 2023-11-20 DIAGNOSIS — H35033 Hypertensive retinopathy, bilateral: Secondary | ICD-10-CM | POA: Diagnosis not present

## 2023-11-20 DIAGNOSIS — Z79899 Other long term (current) drug therapy: Secondary | ICD-10-CM | POA: Diagnosis not present

## 2023-11-20 DIAGNOSIS — H35371 Puckering of macula, right eye: Secondary | ICD-10-CM | POA: Diagnosis not present

## 2023-11-20 DIAGNOSIS — H353211 Exudative age-related macular degeneration, right eye, with active choroidal neovascularization: Secondary | ICD-10-CM | POA: Diagnosis not present

## 2023-11-23 DIAGNOSIS — R32 Unspecified urinary incontinence: Secondary | ICD-10-CM | POA: Diagnosis not present

## 2023-11-23 DIAGNOSIS — M1990 Unspecified osteoarthritis, unspecified site: Secondary | ICD-10-CM | POA: Diagnosis not present

## 2023-11-23 DIAGNOSIS — K219 Gastro-esophageal reflux disease without esophagitis: Secondary | ICD-10-CM | POA: Diagnosis not present

## 2023-11-23 DIAGNOSIS — I739 Peripheral vascular disease, unspecified: Secondary | ICD-10-CM | POA: Diagnosis not present

## 2023-11-23 DIAGNOSIS — I2510 Atherosclerotic heart disease of native coronary artery without angina pectoris: Secondary | ICD-10-CM | POA: Diagnosis not present

## 2023-11-23 DIAGNOSIS — E785 Hyperlipidemia, unspecified: Secondary | ICD-10-CM | POA: Diagnosis not present

## 2023-11-23 DIAGNOSIS — H353290 Exudative age-related macular degeneration, unspecified eye, stage unspecified: Secondary | ICD-10-CM | POA: Diagnosis not present

## 2023-11-23 DIAGNOSIS — G959 Disease of spinal cord, unspecified: Secondary | ICD-10-CM | POA: Diagnosis not present

## 2023-11-23 DIAGNOSIS — Z7982 Long term (current) use of aspirin: Secondary | ICD-10-CM | POA: Diagnosis not present

## 2023-12-26 ENCOUNTER — Encounter: Payer: Self-pay | Admitting: *Deleted

## 2023-12-26 DIAGNOSIS — Z006 Encounter for examination for normal comparison and control in clinical research program: Secondary | ICD-10-CM

## 2023-12-26 DIAGNOSIS — M79642 Pain in left hand: Secondary | ICD-10-CM | POA: Diagnosis not present

## 2023-12-26 DIAGNOSIS — M179 Osteoarthritis of knee, unspecified: Secondary | ICD-10-CM | POA: Diagnosis not present

## 2023-12-26 DIAGNOSIS — M791 Myalgia, unspecified site: Secondary | ICD-10-CM | POA: Diagnosis not present

## 2023-12-26 DIAGNOSIS — M4716 Other spondylosis with myelopathy, lumbar region: Secondary | ICD-10-CM | POA: Diagnosis not present

## 2023-12-26 DIAGNOSIS — M4802 Spinal stenosis, cervical region: Secondary | ICD-10-CM | POA: Diagnosis not present

## 2023-12-26 DIAGNOSIS — M79641 Pain in right hand: Secondary | ICD-10-CM | POA: Diagnosis not present

## 2023-12-26 DIAGNOSIS — Q079 Congenital malformation of nervous system, unspecified: Secondary | ICD-10-CM | POA: Diagnosis not present

## 2023-12-26 DIAGNOSIS — I10 Essential (primary) hypertension: Secondary | ICD-10-CM | POA: Diagnosis not present

## 2023-12-26 DIAGNOSIS — M1990 Unspecified osteoarthritis, unspecified site: Secondary | ICD-10-CM | POA: Diagnosis not present

## 2023-12-26 DIAGNOSIS — I739 Peripheral vascular disease, unspecified: Secondary | ICD-10-CM | POA: Diagnosis not present

## 2023-12-26 NOTE — Research (Signed)
 [name] called about stopping her statin. [name] states that she has been having pain in her fingers, legs, feet,and buttocks at times since the end of Jan.  The pain comes and goes. She saw Cheryl Lamas, PA(her PCP)  who suggested stopping her stain for 4-6 weeks to see if this helps her pain. Emailed my V1P monitor, who reached out to the medical team, who said its ok to stop her temporarily discontinue her statin due to the A/E ( this will not be a protocol deviation). Message left for [facility] [place] to please keep [name]'s Lipid panel blinded to the patient, since the research study is blinded.  Encouraged her to call me with any questions.

## 2024-01-01 DIAGNOSIS — H353211 Exudative age-related macular degeneration, right eye, with active choroidal neovascularization: Secondary | ICD-10-CM | POA: Diagnosis not present

## 2024-01-09 ENCOUNTER — Other Ambulatory Visit (HOSPITAL_BASED_OUTPATIENT_CLINIC_OR_DEPARTMENT_OTHER): Payer: Self-pay | Admitting: Internal Medicine

## 2024-01-09 DIAGNOSIS — R519 Headache, unspecified: Secondary | ICD-10-CM

## 2024-01-11 ENCOUNTER — Ambulatory Visit (HOSPITAL_BASED_OUTPATIENT_CLINIC_OR_DEPARTMENT_OTHER)
Admission: RE | Admit: 2024-01-11 | Discharge: 2024-01-11 | Disposition: A | Discharge status: Home / Self Care | Source: Ambulatory Visit | Attending: Internal Medicine | Admitting: Internal Medicine

## 2024-01-11 DIAGNOSIS — I6782 Cerebral ischemia: Secondary | ICD-10-CM | POA: Diagnosis not present

## 2024-01-11 DIAGNOSIS — R519 Headache, unspecified: Secondary | ICD-10-CM | POA: Insufficient documentation

## 2024-01-16 DIAGNOSIS — M48062 Spinal stenosis, lumbar region with neurogenic claudication: Secondary | ICD-10-CM | POA: Diagnosis not present

## 2024-01-29 DIAGNOSIS — M48062 Spinal stenosis, lumbar region with neurogenic claudication: Secondary | ICD-10-CM | POA: Diagnosis not present

## 2024-02-01 ENCOUNTER — Encounter: Payer: Self-pay | Admitting: *Deleted

## 2024-02-01 DIAGNOSIS — Q079 Congenital malformation of nervous system, unspecified: Secondary | ICD-10-CM | POA: Diagnosis not present

## 2024-02-01 DIAGNOSIS — M79641 Pain in right hand: Secondary | ICD-10-CM | POA: Diagnosis not present

## 2024-02-01 DIAGNOSIS — M4716 Other spondylosis with myelopathy, lumbar region: Secondary | ICD-10-CM | POA: Diagnosis not present

## 2024-02-01 DIAGNOSIS — M79642 Pain in left hand: Secondary | ICD-10-CM | POA: Diagnosis not present

## 2024-02-01 DIAGNOSIS — I739 Peripheral vascular disease, unspecified: Secondary | ICD-10-CM | POA: Diagnosis not present

## 2024-02-01 DIAGNOSIS — Z006 Encounter for examination for normal comparison and control in clinical research program: Secondary | ICD-10-CM

## 2024-02-01 DIAGNOSIS — I10 Essential (primary) hypertension: Secondary | ICD-10-CM | POA: Diagnosis not present

## 2024-02-01 DIAGNOSIS — M7918 Myalgia, other site: Secondary | ICD-10-CM | POA: Diagnosis not present

## 2024-02-01 DIAGNOSIS — M4802 Spinal stenosis, cervical region: Secondary | ICD-10-CM | POA: Diagnosis not present

## 2024-02-01 DIAGNOSIS — M179 Osteoarthritis of knee, unspecified: Secondary | ICD-10-CM | POA: Diagnosis not present

## 2024-02-01 NOTE — Research (Signed)
 Patient called to ask about taking CoQ 10  Her MD took her off of her Crestor and Zetia due to muscle pain.  Her muscle pain has gotten better since being off about a month now.  Her MD recommended CoQ10 - told her it was fine  She asked if something went crazy on her labs if we would let her know  I explained that sponsor would reach out to us .  Then we would reach out to patient.  I explained her that we are keeping a good eye on her.   Bernett Brill :) RN BSN  Clinical Research Nurse  Be strong and take heart, all you who hope in the [place]. ~ Psalm 31:24

## 2024-02-12 DIAGNOSIS — M62561 Muscle wasting and atrophy, not elsewhere classified, right lower leg: Secondary | ICD-10-CM | POA: Insufficient documentation

## 2024-02-12 DIAGNOSIS — H353211 Exudative age-related macular degeneration, right eye, with active choroidal neovascularization: Secondary | ICD-10-CM | POA: Diagnosis not present

## 2024-02-13 ENCOUNTER — Other Ambulatory Visit: Payer: Self-pay | Admitting: Adult Health

## 2024-02-13 DIAGNOSIS — R509 Fever, unspecified: Secondary | ICD-10-CM

## 2024-02-13 DIAGNOSIS — I10 Essential (primary) hypertension: Secondary | ICD-10-CM | POA: Diagnosis not present

## 2024-02-13 DIAGNOSIS — Q079 Congenital malformation of nervous system, unspecified: Secondary | ICD-10-CM | POA: Diagnosis not present

## 2024-02-13 DIAGNOSIS — R1032 Left lower quadrant pain: Secondary | ICD-10-CM | POA: Diagnosis not present

## 2024-02-13 DIAGNOSIS — M4802 Spinal stenosis, cervical region: Secondary | ICD-10-CM | POA: Diagnosis not present

## 2024-02-13 DIAGNOSIS — M6289 Other specified disorders of muscle: Secondary | ICD-10-CM | POA: Diagnosis not present

## 2024-02-13 DIAGNOSIS — E785 Hyperlipidemia, unspecified: Secondary | ICD-10-CM | POA: Diagnosis not present

## 2024-02-13 DIAGNOSIS — R634 Abnormal weight loss: Secondary | ICD-10-CM

## 2024-02-13 DIAGNOSIS — Z1152 Encounter for screening for COVID-19: Secondary | ICD-10-CM | POA: Diagnosis not present

## 2024-02-13 DIAGNOSIS — R42 Dizziness and giddiness: Secondary | ICD-10-CM | POA: Diagnosis not present

## 2024-02-20 ENCOUNTER — Encounter: Payer: Self-pay | Admitting: Adult Health

## 2024-02-25 ENCOUNTER — Ambulatory Visit
Admission: RE | Admit: 2024-02-25 | Discharge: 2024-02-25 | Disposition: A | Discharge status: Home / Self Care | Source: Ambulatory Visit | Attending: Adult Health | Admitting: Adult Health

## 2024-02-25 DIAGNOSIS — R1032 Left lower quadrant pain: Secondary | ICD-10-CM

## 2024-02-25 DIAGNOSIS — R634 Abnormal weight loss: Secondary | ICD-10-CM

## 2024-02-25 DIAGNOSIS — N132 Hydronephrosis with renal and ureteral calculous obstruction: Secondary | ICD-10-CM | POA: Diagnosis not present

## 2024-02-25 DIAGNOSIS — R509 Fever, unspecified: Secondary | ICD-10-CM

## 2024-02-25 MED ORDER — IOPAMIDOL (ISOVUE-300) INJECTION 61%
100.0000 mL | Freq: Once | INTRAVENOUS | Status: AC | PRN
  Administered 2024-02-25: 100 mL via INTRAVENOUS

## 2024-03-03 DIAGNOSIS — N200 Calculus of kidney: Secondary | ICD-10-CM | POA: Diagnosis not present

## 2024-03-06 ENCOUNTER — Ambulatory Visit

## 2024-03-06 ENCOUNTER — Ambulatory Visit
Admission: RE | Admit: 2024-03-06 | Discharge: 2024-03-06 | Disposition: A | Discharge status: Home / Self Care | Source: Ambulatory Visit | Attending: Internal Medicine | Admitting: Internal Medicine

## 2024-03-06 DIAGNOSIS — R928 Other abnormal and inconclusive findings on diagnostic imaging of breast: Secondary | ICD-10-CM

## 2024-03-10 ENCOUNTER — Telehealth: Payer: Self-pay | Admitting: Cardiovascular Disease

## 2024-03-10 ENCOUNTER — Other Ambulatory Visit: Payer: Self-pay | Admitting: Urology

## 2024-03-10 NOTE — Telephone Encounter (Signed)
   Pre-operative Risk Assessment    Patient Name: [name]  DOB: 1955-12-22 MRN: [id number]   Date of last office visit:  Date of next office visit:    Request for Surgical Clearance    Procedure:   Cystoscopy  and Ureteroscopy with Laser and Stents placement  Date of Surgery:  04-15-24                               Surgeon:  Dr Vona Grumet Surgeon's Group or Practice Name:   Phone number:  [phone] Fax number:  [phone]   Type of Clearance Requested:   - Medical  - Pharmacy:  Hold Aspirin  can she hold 5 days prior to surgery   Type of Anesthesia:  General    Additional requests/questions:    [name]   03/10/2024, 2:14 PM

## 2024-03-11 ENCOUNTER — Telehealth: Payer: Self-pay | Admitting: *Deleted

## 2024-03-11 NOTE — Telephone Encounter (Signed)
   Name: [name]  DOB: 10-Mar-1956  MRN: [id number]  Primary Cardiologist: Avery Bodo, MD   Preoperative team, please contact this patient and set up a phone call appointment for further preoperative risk assessment. Please obtain consent and complete medication review. Thank you for your help.  I confirm that guidance regarding antiplatelet and oral anticoagulation therapy has been completed and, if necessary, noted below.  Ideally aspirin  should be continued without interruption, however if the bleeding risk is too great, aspirin  may be held for 5-7 days prior to surgery. Please resume aspirin  post operatively when it is felt to be safe from a bleeding standpoint.    I also confirmed the patient resides in the state of [place] . As per [facility] Medical Board telemedicine laws, the patient must reside in the state in which the provider is licensed.   Morey Ar, NP 03/11/2024, 12:08 PM [facility]

## 2024-03-11 NOTE — Telephone Encounter (Signed)
 Pt has been scheduled tele preop appt 03/31/24. Med rec and consent are done.     Patient Consent for Virtual Visit        [name] has provided verbal consent on 03/11/2024 for a virtual visit (video or telephone).   CONSENT FOR VIRTUAL VISIT FOR:  [name]  By participating in this virtual visit I agree to the following:  I hereby voluntarily request, consent and authorize [facility] and its employed or contracted physicians, physician assistants, nurse practitioners or other licensed health care professionals (the Practitioner), to provide me with telemedicine health care services (the "Services) as deemed necessary by the treating Practitioner. I acknowledge and consent to receive the Services by the Practitioner via telemedicine. I understand that the telemedicine visit will involve communicating with the Practitioner through live audiovisual communication technology and the disclosure of certain medical information by electronic transmission. I acknowledge that I have been given the opportunity to request an in-person assessment or other available alternative prior to the telemedicine visit and am voluntarily participating in the telemedicine visit.  I understand that I have the right to withhold or withdraw my consent to the use of telemedicine in the course of my care at any time, without affecting my right to future care or treatment, and that the Practitioner or I may terminate the telemedicine visit at any time. I understand that I have the right to inspect all information obtained and/or recorded in the course of the telemedicine visit and may receive copies of available information for a reasonable fee.  I understand that some of the potential risks of receiving the Services via telemedicine include:  Delay or interruption in medical evaluation due to technological equipment failure or disruption; Information transmitted may not be sufficient (e.g. poor resolution  of images) to allow for appropriate medical decision making by the Practitioner; and/or  In rare instances, security protocols could fail, causing a breach of personal health information.  Furthermore, I acknowledge that it is my responsibility to provide information about my medical history, conditions and care that is complete and accurate to the best of my ability. I acknowledge that Practitioner's advice, recommendations, and/or decision may be based on factors not within their control, such as incomplete or inaccurate data provided by me or distortions of diagnostic images or specimens that may result from electronic transmissions. I understand that the practice of medicine is not an exact science and that Practitioner makes no warranties or guarantees regarding treatment outcomes. I acknowledge that a copy of this consent can be made available to me via my patient portal [facility] MyChart), or I can request a printed copy by calling the office of [facility].    I understand that my insurance will be billed for this visit.   I have read or had this consent read to me. I understand the contents of this consent, which adequately explains the benefits and risks of the Services being provided via telemedicine.  I have been provided ample opportunity to ask questions regarding this consent and the Services and have had my questions answered to my satisfaction. I give my informed consent for the services to be provided through the use of telemedicine in my medical care

## 2024-03-11 NOTE — Telephone Encounter (Signed)
 Pt has been scheduled tele preop appt 03/31/24. Med rec and consent are done.

## 2024-03-26 ENCOUNTER — Encounter (HOSPITAL_COMMUNITY): Payer: Self-pay

## 2024-03-31 ENCOUNTER — Ambulatory Visit: Attending: Cardiology | Admitting: Nurse Practitioner

## 2024-03-31 DIAGNOSIS — Z006 Encounter for examination for normal comparison and control in clinical research program: Secondary | ICD-10-CM | POA: Diagnosis not present

## 2024-03-31 NOTE — Progress Notes (Signed)
 Virtual Visit via Telephone Note   Because of [name] co-morbid illnesses, she is at least at moderate risk for complications without adequate follow up.  This format is felt to be most appropriate for this patient at this time.  Due to technical limitations with video connection Web designer), today's appointment will be conducted as an audio only telehealth visit, and [name] verbally agreed to proceed in this manner.   All issues noted in this document were discussed and addressed.  No physical exam could be performed with this format.  Evaluation Performed:  Preoperative cardiovascular risk assessment _____________   Date:  03/31/2024   Patient ID:  [name], DOB 08-21-1956, MRN [id number] Patient Location:  Home Provider location:   Office  Primary Care Provider:  Shayne Anes, MD Primary Cardiologist:  Candyce Reek, MD  Chief Complaint / Patient Profile   68 y.o. y/o female with a h/o coronary artery calcification, mild bilateral carotid artery stenosis, hypertension, hyperlipidemia, chronic bilateral lower extremity edema, and GERD who is pending cystoscopy and ureteroscopy with laser and stent placement on 04/15/2024 with Dr. Ronal Leeroy Shank and presents today for telephonic preoperative cardiovascular risk assessment.  History of Present Illness    [name] is a 68 y.o. female who presents via audio/video conferencing for a telehealth visit today.  Pt was last seen in cardiology clinic on 05/14/2023 by Orren Fabry, PA-C. At that time [name] was doing well.  The patient is now pending procedure as outlined above. Since her last visit, she been stable from a cardiac standpoint.  She has stable mild intermittent dyspnea on exertion, unchanged from prior visits.  She denies chest pain, palpitations, pnd, orthopnea, n, v, dizziness, syncope, edema, weight gain, or early satiety. All other systems reviewed and are otherwise negative except as noted  above.   Past Medical History    Past Medical History:  Diagnosis Date   Anxiety    Arthritis    Deviated septum    Dyspnea    with exertion   wheezing at night   Fibroid tumor    GERD (gastroesophageal reflux disease) 11/02/2015   Hepatitis    AS CHILD     Hypercholesteremia 07/21/2015   Hypertension 07/10/2015   Memory loss    Meniscus tear    PONV (postoperative nausea and vomiting)    Restless leg syndrome 09/01/2015   Retinal detachment 03/06/2001   Spinal stenosis    Urinary frequency    Past Surgical History:  Procedure Laterality Date   ANTERIOR CERVICAL DECOMP/DISCECTOMY FUSION N/A 11/02/2015   Procedure: ANTERIOR CERVICAL DECOMPRESSION FUSION CERVICAL FIVE-SIX,CERVICAL SIX-SEVEN;  Surgeon: Darina Boehringer, MD;  Location: [facility];  Service: Neurosurgery;  Laterality: N/A;  right side approach   CATARACT EXTRACTION Right    fibroid tumor     KNEE SURGERY Bilateral    RETINAL DETACHMENT SURGERY Right 03/06/2001   TOTAL KNEE ARTHROPLASTY Right 11/08/2017   TOTAL KNEE ARTHROPLASTY Right 11/08/2017   Procedure: RIGHT TOTAL KNEE ARTHROPLASTY;  Surgeon: Jerri Kay HERO, MD;  Location: [facility] OR;  Service: Orthopedics;  Laterality: Right;    Allergies  No Known Allergies  Home Medications    Prior to Admission medications   Medication Sig Start Date End Date Taking? Authorizing Provider  aspirin  EC 81 MG tablet Take 81 mg by mouth daily. Swallow whole.    [provider]  azaTHIOprine  (IMURAN ) 50 MG tablet Take 1 tablet (50 mg total) by mouth daily.  Patient taking differently: Take 50 mg by mouth in the morning. 05/16/16   Onita Duos, MD  Calcium -Magnesium -Zinc 333-133-5 MG TABS Take 1 tablet by mouth daily at 2 PM. Take one a daily    [provider]  carvedilol  (COREG ) 3.125 MG tablet Take 1 tablet (3.125 mg total) by mouth 2 (two) times daily. Patient taking differently: Take 3.125 mg by mouth 2 (two) times daily. PT STATES SHE TAKE 6.25 BID  05/14/23   Conte, Tessa N, PA-C  chlorthalidone  (HYGROTON ) 25 MG tablet Take 25 mg by mouth every other day.    [provider]  Cholecalciferol  (VITAMIN D3) 1.25 MG (50000 UT) CAPS Take 1 tablet by mouth daily.    [provider]  docusate sodium  (COLACE) 100 MG capsule Take 100 mg by mouth daily.    [provider]  ezetimibe (ZETIA) 10 MG tablet Take 10 mg by mouth daily. Patient not taking: Reported on 03/11/2024 02/14/22   [provider]  Faricimab -svoa (VABYSMO  IZ) 1 Dose by Intravitreal route every 6 (six) weeks. Right eye    [provider]  gabapentin  (NEURONTIN ) 300 MG capsule Take 300 mg by mouth at bedtime. Take up to 4 times per day (1200 mg) Per patient taking 2 (600mg ) at night 04/05/20   [provider]  Melatonin 10 MG CAPS Take 1 Capful by mouth at bedtime.    [provider]  omeprazole (PRILOSEC) 20 MG capsule Take 20 mg by mouth daily. Patient taking differently: Take 20 mg by mouth daily. TAKES every other day PER PT 02/17/16   [provider]  OVER THE COUNTER MEDICATION Take 1 tablet by mouth daily. [employer]    [provider]  rOPINIRole (REQUIP) 0.5 MG tablet Take 0.5 mg by mouth at bedtime. 03/13/20   [provider]  rosuvastatin (CRESTOR) 20 MG tablet Take 20 mg by mouth daily. Patient not taking: Reported on 03/11/2024    [provider]  senna-docusate (SENOKOT S) 8.6-50 MG tablet Take 1 tablet by mouth at bedtime as needed. 11/08/17   Jerri Kay HERO, MD  Study - VICTORION-1 PREVENT - inclisiran 300 mg/1.37mL or placebo SQ injection (PI-Stuckey) Inject 300 mg into the skin every 6 (six) months. For Investigational Use Only. Inject subcutaneously into the abdomen, upper arm or thigh every 6 months. Do not inject into areas of active skin disease,such as sun burns, skin rashes, inflammation, or skin infections.Please contact [facility] for any questions or  concerns.    Morris Debby BIRCH, MD  vitamin B-12 (CYANOCOBALAMIN) 250 MCG tablet Take 2,500 mcg by mouth daily.    [provider]    Physical Exam    Vital Signs:  [name] does not have vital signs available for review today.  Given telephonic nature of communication, physical exam is limited. AAOx3. NAD. Normal affect.  Speech and respirations are unlabored.  Accessory Clinical Findings    None  Assessment & Plan    1.  Preoperative Cardiovascular Risk Assessment:  According to the Revised Cardiac Risk Index (RCRI), her Perioperative Risk of Major Cardiac Event is (%): 0.4. Her Functional Capacity in METs is: 4.95 according to the [facility] Activity Status Index (DASI). Therefore, based on ACC/AHA guidelines, patient would be at acceptable risk for the planned procedure without further cardiovascular testing.  The patient was advised that if she develops new symptoms prior to surgery to contact our office to arrange for a follow-up visit, and she verbalized understanding.  Ideally aspirin  should be continued without interruption, however if the bleeding risk is too great, aspirin  may be held for 5-7 days prior to surgery. Please resume aspirin  post operatively when it is felt to be safe from a bleeding standpoint.      A copy of this note will be routed to requesting surgeon.  Time:   Today, I have spent 9 minutes with the patient with telehealth technology discussing medical history, symptoms, and management plan.     Damien JAYSON Braver, NP  03/31/2024, 1:52 PM

## 2024-04-01 DIAGNOSIS — E663 Overweight: Secondary | ICD-10-CM | POA: Diagnosis not present

## 2024-04-01 DIAGNOSIS — M2559 Pain in other specified joint: Secondary | ICD-10-CM | POA: Diagnosis not present

## 2024-04-01 DIAGNOSIS — Z6825 Body mass index (BMI) 25.0-25.9, adult: Secondary | ICD-10-CM | POA: Diagnosis not present

## 2024-04-03 NOTE — Patient Instructions (Signed)
 SURGICAL WAITING ROOM VISITATION Patients having surgery or a procedure may have no more than 2 support people in the waiting area - these visitors may rotate in the visitor waiting room.   Due to an increase in RSV and influenza rates and associated hospitalizations, children ages 20 and under may not visit patients in [facility] hospitals. If the patient needs to stay at the hospital during part of their recovery, the visitor guidelines for inpatient rooms apply.  PRE-OP VISITATION  Pre-op nurse will coordinate an appropriate time for 1 support person to accompany the patient in pre-op.  This support person may not rotate.  This visitor will be contacted when the time is appropriate for the visitor to come back in the pre-op area.  Please refer to the [facility] website for the visitor guidelines for Inpatients (after your surgery is over and you are in a regular room).  You are not required to quarantine at this time prior to your surgery. However, you must do this: Hand Hygiene often Do NOT share personal items Notify your provider if you are in close contact with someone who has COVID or you develop fever 100.4 or greater, new onset of sneezing, cough, sore throat, shortness of breath or body aches.  If you test positive for Covid or have been in contact with anyone that has tested positive in the last 10 days please notify you surgeon.    Your procedure is scheduled on:  04/15/24  Report to [facility] Main Entrance: [place] entrance where the [employer] is available.   Report to admitting at: 7:30 AM  Call this number if you have any questions or problems the morning of surgery [phone]  FOLLOW ANY ADDITIONAL PRE OP INSTRUCTIONS YOU RECEIVED FROM YOUR SURGEON'S OFFICE!!!  Do not eat food or drink after Midnight the night prior to your surgery/procedure.   Oral Hygiene is also important to reduce your risk of infection.        Remember - BRUSH YOUR TEETH THE  MORNING OF SURGERY WITH YOUR REGULAR TOOTHPASTE  Do NOT smoke after Midnight the night before surgery.  STOP TAKING all Vitamins, Herbs and supplements 1 week before your surgery.   Take ONLY these medicines the morning of surgery with A SIP OF WATER: gabapentin ,carvedilol ,omeprazole,azathioprine (imuran )                  You may not have any metal on your body including hair pins, jewelry, and body piercing  Do not wear make-up, lotions, powders, perfumes / cologne, or deodorant  Do not wear nail polish including gel and S&S, artificial / acrylic nails, or any other type of covering on natural nails including finger and toenails. If you have artificial nails, gel coating, etc., that needs to be removed by a nail salon, Please have this removed prior to surgery. Not doing so may mean that your surgery could be cancelled or delayed if the Surgeon or anesthesia staff feels like they are unable to monitor you safely.   Do not shave 48 hours prior to surgery to avoid nicks in your skin which may contribute to postoperative infections.   Contacts, Hearing Aids, dentures or bridgework may not be worn into surgery. DENTURES WILL BE REMOVED PRIOR TO SURGERY PLEASE DO NOT APPLY Poly grip OR ADHESIVES!!!  You may bring a small overnight bag with you on the day of surgery, only pack items that are not valuable. [facility] IS NOT RESPONSIBLE   FOR VALUABLES  THAT ARE LOST OR STOLEN.   Patients discharged on the day of surgery will not be allowed to drive home.  Someone NEEDS to stay with you for the first 24 hours after anesthesia.  Do not bring your home medications to the hospital. The Pharmacy will dispense medications listed on your medication list to you during your admission in the Hospital.  Special Instructions: Bring a copy of your healthcare power of attorney and living will documents the day of surgery, if you wish to have them scanned into your [facility] Records- EPIC  Please read  over the following fact sheets you were given: IF YOU HAVE QUESTIONS ABOUT YOUR PRE-OP INSTRUCTIONS, PLEASE CALL [phone]   [facility] - Preparing for Surgery Before surgery, you can play an important role.  Because skin is not sterile, your skin needs to be as free of germs as possible.  You can reduce the number of germs on your skin by washing with CHG (chlorahexidine gluconate) soap before surgery.  CHG is an antiseptic cleaner which kills germs and bonds with the skin to continue killing germs even after washing. Please DO NOT use if you have an allergy to CHG or antibacterial soaps.  If your skin becomes reddened/irritated stop using the CHG and inform your nurse when you arrive at Short Stay. Do not shave (including legs and underarms) for at least 48 hours prior to the first CHG shower.  You may shave your face/neck.  Please follow these instructions carefully:  1.  Shower with CHG Soap the night before surgery and the  morning of surgery.  2.  If you choose to wash your hair, wash your hair first as usual with your normal  shampoo.  3.  After you shampoo, rinse your hair and body thoroughly to remove the shampoo.                             4.  Use CHG as you would any other liquid soap.  You can apply chg directly to the skin and wash.  Gently with a scrungie or clean washcloth.  5.  Apply the CHG Soap to your body ONLY FROM THE NECK DOWN.   Do not use on face/ open                           Wound or open sores. Avoid contact with eyes, ears mouth and genitals (private parts).                       Wash face,  Genitals (private parts) with your normal soap.             6.  Wash thoroughly, paying special attention to the area where your  surgery  will be performed.  7.  Thoroughly rinse your body with warm water from the neck down.  8.  DO NOT shower/wash with your normal soap after using and rinsing off the CHG Soap.            9.  Pat yourself dry with a clean towel.             10.  Wear clean pajamas.            11.  Place clean sheets on your bed the night of your first shower and do not  sleep with pets.  ON THE DAY OF  SURGERY : Do not apply any lotions/deodorants the morning of surgery.  Please wear clean clothes to the hospital/surgery center.     FAILURE TO FOLLOW THESE INSTRUCTIONS MAY RESULT IN THE CANCELLATION OF YOUR SURGERY  PATIENT SIGNATURE_________________________________  NURSE SIGNATURE__________________________________  ________________________________________________________________________

## 2024-04-04 ENCOUNTER — Other Ambulatory Visit: Payer: Self-pay

## 2024-04-04 ENCOUNTER — Encounter (HOSPITAL_COMMUNITY)
Admission: RE | Admit: 2024-04-04 | Discharge: 2024-04-04 | Disposition: A | Discharge status: Home / Self Care | Source: Ambulatory Visit | Attending: Urology | Admitting: Urology

## 2024-04-04 ENCOUNTER — Encounter (HOSPITAL_COMMUNITY): Payer: Self-pay

## 2024-04-04 VITALS — BP 142/81 | HR 60 | Temp 98.0°F | Ht 66.0 in | Wt 163.1 lb

## 2024-04-04 DIAGNOSIS — I10 Essential (primary) hypertension: Secondary | ICD-10-CM | POA: Insufficient documentation

## 2024-04-04 DIAGNOSIS — N200 Calculus of kidney: Secondary | ICD-10-CM | POA: Insufficient documentation

## 2024-04-04 DIAGNOSIS — I2510 Atherosclerotic heart disease of native coronary artery without angina pectoris: Secondary | ICD-10-CM | POA: Diagnosis not present

## 2024-04-04 DIAGNOSIS — Z01812 Encounter for preprocedural laboratory examination: Secondary | ICD-10-CM | POA: Diagnosis not present

## 2024-04-04 HISTORY — DX: Pneumonia, unspecified organism: J18.9

## 2024-04-04 LAB — BASIC METABOLIC PANEL WITH GFR
Anion gap: 10 (ref 5–15)
BUN: 22 mg/dL (ref 8–23)
CO2: 26 mmol/L (ref 22–32)
Calcium: 9.5 mg/dL (ref 8.9–10.3)
Chloride: 101 mmol/L (ref 98–111)
Creatinine, Ser: 0.76 mg/dL (ref 0.44–1.00)
GFR, Estimated: >60 mL/min (ref >60)
Glucose, Bld: 112 mg/dL — ABNORMAL HIGH (ref 70–99)
Potassium: 3.9 mmol/L (ref 3.5–5.1)
Sodium: 137 mmol/L (ref 135–145)

## 2024-04-04 LAB — CBC
HCT: 39.9 % (ref 36.0–46.0)
Hemoglobin: 13.3 g/dL (ref 12.0–15.0)
MCH: 32.8 pg (ref 26.0–34.0)
MCHC: 33.3 g/dL (ref 30.0–36.0)
MCV: 98.3 fL (ref 80.0–100.0)
Platelets: 278 K/uL (ref 150–400)
RBC: 4.06 MIL/uL (ref 3.87–5.11)
RDW: 13.4 % (ref 11.5–15.5)
WBC: 6.7 K/uL (ref 4.0–10.5)
nRBC: 0 % (ref 0.0–0.2)

## 2024-04-04 NOTE — Progress Notes (Addendum)
 For Anesthesia: PCP - Shayne Anes, MD  Cardiologist - Dann Candyce RAMAN, MD  Clearance:Monge, Damien BROCKS, NP : 03/31/24 Bowel Prep reminder:  Chest x-ray - CT coronary: 05/02/22 EKG - 05/14/23 Stress Test - 2017 ECHO - 04/19/16 Cardiac Cath -  Pacemaker/ICD device last checked: Pacemaker orders received: Device Rep notified:  Spinal Cord Stimulator:N/A  Sleep Study - N/A CPAP -   Fasting Blood Sugar - N/A Checks Blood Sugar _____ times a day Date and result of last Hgb A1c-  Last dose of GLP1 agonist- N/A GLP1 instructions:   Last dose of SGLT-2 inhibitors- N/A SGLT-2 instructions:   Blood Thinner Instructions: Aspirin  Instructions: To hold it 5-7 days before surgery. Last Dose:  Activity level: Can go up a flight of stairs and activities of daily living without stopping and without chest pain and/or shortness of breath   Able to exercise without chest pain and/or shortness of breath  Anesthesia review: Hx: HTN,Coronary artery calcification.  Patient denies shortness of breath, fever, cough and chest pain at PAT appointment   Patient verbalized understanding of instructions that were reviewed over the telephone.

## 2024-04-07 NOTE — Progress Notes (Signed)
 Anesthesia Chart Review   Case: [id number] Date/Time: 04/15/24 0930   Procedure: CYSTOSCOPY/URETEROSCOPY/HOLMIUM LASER/STENT PLACEMENT (Left) - CYSTOSCOPY/LEFT URETEROSCOPY/HOLMIUM LASER/STENT PLACEMENT   Anesthesia type: General   Diagnosis: Kidney stone [N20.0]   Pre-op diagnosis: LEFT RENAL CALCULUS   Location: [facility] ROOM 06 / [facility]   Surgeons: Elisabeth Valli BIRCH, MD       DISCUSSION:68 y.o. never smoker with h/o PONV, HTN, GERD, left renal calculus scheduled for above procedure 04/15/24 with Dr. Valli Elisabeth.   Pt had a telephone visit with cardiology 03/31/2024. Per notes she has stable mild intermittent dyspnea on exertion, unchanged from prior visits. CT coronary 2023 with nonobstructive CAD. Low risk stress test 2017. Echo 2017 with EF 60-65%, no valvular problems. Per note, According to the Revised Cardiac Risk Index (RCRI), her Perioperative Risk of Major Cardiac Event is (%): 0.4. Her Functional Capacity in METs is: 4.95 according to the Duke Activity Status Index (DASI). Therefore, based on ACC/AHA guidelines, patient would be at acceptable risk for the planned procedure without further cardiovascular testing.   Pt with longstanding joint pain, left weakness. Evaluated by rheumatology, seen 7/8/225. No evidence of rheumatic cause per notes. Possibly related to lumbar stenosis. Moderate to severe spinal canal stenosis L4-5, mild canal stenosis L3-4 on MRI.    H/o ACDF C5-4, C6-7.  VS: BP (!) 142/81   Pulse 60   Temp 36.7 C (Oral)   Ht 5' 6 (1.676 m)   Wt 74 kg   SpO2 100%   BMI 26.33 kg/m   PROVIDERS: Shayne Anes, MD is PCP   Cardiologist - Dann Candyce RAMAN, MD  LABS: Labs reviewed: Acceptable for surgery. (all labs ordered are listed, but only abnormal results are displayed)  Labs Reviewed  BASIC METABOLIC PANEL WITH GFR - Abnormal; Notable for the following components:      Result Value   Glucose, Bld 112 (*)    All other components within normal limits  CBC      IMAGES: VAS US  Carotid 11/21/2019 Summary:  Right Carotid: Velocities in the right ICA are consistent with a 1-39%  stenosis.   Left Carotid: Velocities in the left ICA are consistent with a 1-39%  stenosis.                 Elevated velocities without the presence of disease observed  in               the distal ICA.   EKG:   CV: CT Coronary 05/02/2022 FINDINGS: FFRct analysis was performed on the original cardiac CT angiogram dataset. Diagrammatic representation of the FFRct analysis is provided in a separate PDF document in PACS. This dictation was created using the PDF document and an interactive 3D model of the results. 3D model is not available in the EMR/PACS. Normal FFR range is >0.80.   1. Left Main: No significant stenosis   2. LAD: CTFFR 0.85 across lesion in proximal to mid LAD   3. LCX: CTFFR 0.93 across lesion in proximal LCX, 0.82 across lesion in mid LCX   4. RCA: Unable to evaluate due to artifact   IMPRESSION: 1. CTFFR suggests nonobstructive CAD in LAD and LCX. RCA was unable to be evaluated by FFR due to artifact, but appeared nonobstructive on CTA.  Echo 04/19/2016 - Left ventricle: The cavity size was normal. There was mild    concentric hypertrophy. Systolic function was normal. The    estimated ejection fraction was in the range of 60% to 65%.  Wall    motion was normal; there were no regional wall motion    abnormalities. Left ventricular diastolic function parameters    were normal.  - Atrial septum: No defect or patent foramen ovale was identified.   Myocardial Perfusion 04/19/2016 Nuclear stress EF: 65%. Normal wall motion There was no ST segment deviation noted during stress. Defect 1: There is a small defect of moderate severity present in the mid anteroseptal location, partially reversible. This may represent RV insertion point or a very small amount of localized ischemia possibly small diagonal branch. This is a overall a low risk  study. Past Medical History:  Diagnosis Date   Anxiety    Arthritis    Deviated septum    Dyspnea    with exertion   wheezing at night   Fibroid tumor    GERD (gastroesophageal reflux disease) 11/02/2015   Hepatitis    AS CHILD     Hypercholesteremia 07/21/2015   Hypertension 07/10/2015   Memory loss    Meniscus tear    Pneumonia    PONV (postoperative nausea and vomiting)    Restless leg syndrome 09/01/2015   Retinal detachment 03/06/2001   Spinal stenosis    Urinary frequency     Past Surgical History:  Procedure Laterality Date   ANTERIOR CERVICAL DECOMP/DISCECTOMY FUSION N/A 11/02/2015   Procedure: ANTERIOR CERVICAL DECOMPRESSION FUSION CERVICAL FIVE-SIX,CERVICAL SIX-SEVEN;  Surgeon: Darina Boehringer, MD;  Location: [facility];  Service: Neurosurgery;  Laterality: N/A;  right side approach   BACK SURGERY     CATARACT EXTRACTION Bilateral    fibroid tumor     KNEE SURGERY Bilateral    RETINAL DETACHMENT SURGERY Right 03/06/2001   TOTAL KNEE ARTHROPLASTY Right 11/08/2017   TOTAL KNEE ARTHROPLASTY Right 11/08/2017   Procedure: RIGHT TOTAL KNEE ARTHROPLASTY;  Surgeon: Jerri Kay HERO, MD;  Location: [facility];  Service: Orthopedics;  Laterality: Right;    MEDICATIONS:  aspirin  EC 81 MG tablet   azaTHIOprine  (IMURAN ) 50 MG tablet   Calcium -Magnesium -Zinc 333-133-5 MG TABS   carvedilol  (COREG ) 3.125 MG tablet   carvedilol  (COREG ) 6.25 MG tablet   chlorthalidone  (HYGROTON ) 25 MG tablet   Cholecalciferol  (VITAMIN D3) 125 MCG (5000 UT) TABS   docusate sodium  (COLACE) 100 MG capsule   Faricimab -svoa (VABYSMO  IZ)   gabapentin  (NEURONTIN ) 300 MG capsule   melatonin 5 MG TABS   omeprazole (PRILOSEC) 20 MG capsule   OVER THE COUNTER MEDICATION   rOPINIRole (REQUIP) 0.5 MG tablet   senna-docusate (SENOKOT S) 8.6-50 MG tablet   Study - VICTORION-1 PREVENT - inclisiran 300 mg/1.79mL or placebo SQ injection (PI-Stuckey)   vitamin B-12 (CYANOCOBALAMIN) 250 MCG tablet    Study -  VICTORION-1 PREVENT - inclisiran 300 mg/1.5mL or placebo SQ injection (PI-Stuckey)    Harlene Hoots Ward, PA-C [facility] [phone]

## 2024-04-07 NOTE — Anesthesia Preprocedure Evaluation (Addendum)
 Anesthesia Evaluation  Patient identified by MRN, date of birth, ID band Patient awake    Reviewed: Allergy & Precautions, NPO status , Patient's Chart, lab work & pertinent test results  History of Anesthesia Complications (+) PONV and history of anesthetic complications  Airway Mallampati: II  TM Distance: >3 FB Neck ROM: Full    Dental no notable dental hx.    Pulmonary sleep apnea    Pulmonary exam normal        Cardiovascular hypertension, Pt. on medications and Pt. on home beta blockers + CAD and + Peripheral Vascular Disease   Rhythm:Regular Rate:Normal     Neuro/Psych   Anxiety     negative neurological ROS     GI/Hepatic ,GERD  Medicated,,  Endo/Other  negative endocrine ROS    Renal/GU Renal disease  negative genitourinary   Musculoskeletal  (+) Arthritis , Osteoarthritis,    Abdominal Normal abdominal exam  (+)   Peds  Hematology  (+) Blood dyscrasia, anemia Lab Results      Component                Value               Date                      WBC                      6.7                 04/04/2024                HGB                      13.3                04/04/2024                HCT                      39.9                04/04/2024                MCV                      98.3                04/04/2024                PLT                      278                 04/04/2024              Anesthesia Other Findings   Reproductive/Obstetrics                              Anesthesia Physical Anesthesia Plan  ASA: 2  Anesthesia Plan: General   Post-op Pain Management: Tylenol  PO (pre-op)*   Induction: Intravenous  PONV Risk Score and Plan: 4 or greater and Ondansetron , Dexamethasone  and Treatment may vary due to age or medical condition  Airway Management Planned: Mask and LMA  Additional Equipment: None  Intra-op Plan:   Post-operative Plan: Extubation in  OR  Informed Consent: I have reviewed the patients History and Physical, chart, labs and discussed the procedure including the risks, benefits and alternatives for the proposed anesthesia with the patient or authorized representative who has indicated his/her understanding and acceptance.     Dental advisory given and Interpreter used for interview  Plan Discussed with: CRNA  Anesthesia Plan Comments: (See PAT note 04/04/2024)         Anesthesia Quick Evaluation

## 2024-04-08 DIAGNOSIS — H353211 Exudative age-related macular degeneration, right eye, with active choroidal neovascularization: Secondary | ICD-10-CM | POA: Diagnosis not present

## 2024-04-09 ENCOUNTER — Other Ambulatory Visit: Payer: Self-pay

## 2024-04-09 ENCOUNTER — Encounter: Payer: Medicare PPO | Admitting: *Deleted

## 2024-04-09 VITALS — BP 129/64 | HR 64 | Temp 97.7°F | Resp 16 | Wt 163.0 lb

## 2024-04-09 DIAGNOSIS — H15833 Staphyloma posticum, bilateral: Secondary | ICD-10-CM | POA: Insufficient documentation

## 2024-04-09 DIAGNOSIS — N200 Calculus of kidney: Secondary | ICD-10-CM | POA: Insufficient documentation

## 2024-04-09 DIAGNOSIS — Z006 Encounter for examination for normal comparison and control in clinical research program: Secondary | ICD-10-CM | POA: Insufficient documentation

## 2024-04-09 DIAGNOSIS — K5900 Constipation, unspecified: Secondary | ICD-10-CM | POA: Insufficient documentation

## 2024-04-09 DIAGNOSIS — I2510 Atherosclerotic heart disease of native coronary artery without angina pectoris: Secondary | ICD-10-CM | POA: Insufficient documentation

## 2024-04-09 DIAGNOSIS — R634 Abnormal weight loss: Secondary | ICD-10-CM | POA: Insufficient documentation

## 2024-04-09 MED ORDER — STUDY - VICTORION-1 PREVENT - INCLISIRAN 300 MG/1.5 ML OR PLACEBO SQ INJECTION (PI-CHRISTOPHER)
300.0000 mg | INJECTION | SUBCUTANEOUS | Status: DC
  Administered 2024-04-09: 300 mg via SUBCUTANEOUS
  Filled 2024-04-09: qty 1.5

## 2024-04-09 NOTE — Research (Addendum)
 DATE: 09-Apr-2024  SUBJECT ID: [id number]  Visit:5  Prior or concomitant medications reviewed [x]   Lipid lowering medications reviewed [x]   Surgical history reviewed [x]   TIME: BP: PULSE:  0825 130/59 64  0827 127/63 64  0829 129/64 64  Mean BP: 128/62  Mean Pulse: 64  Labs collected at:0847 Fasting Lipid Profile [x]  Fasting Lp(a) [x]  Liver function tests []  Exploratory biomarkers []   AE/SAE assessment completed [x]   Study drug administered [x]   Endpoint assessment completed [x]     [name] is here for Visit 5 of V1P. She reports no abd pain, but has had some on and off pain in her back. She had a scan that shows kidney stones, and will be having surgery later this month. She states that she is no longer taking Crestor and Zetia  due to severe leg pain. The medication was stopped in May 2025. No other med changes noted. Injection was given in the right lower abd at 0855. Tol well. Scheduled next appointment for Jan 14 for 0830.   Current Outpatient Medications:    aspirin  EC 81 MG tablet, Take 81 mg by mouth daily. Swallow whole., Disp: , Rfl:    azaTHIOprine  (IMURAN ) 50 MG tablet, Take 1 tablet (50 mg total) by mouth daily., Disp: 90 tablet, Rfl: 4   Calcium -Magnesium -Zinc 333-133-5 MG TABS, Take 1 tablet by mouth daily., Disp: , Rfl:    carvedilol  (COREG ) 6.25 MG tablet, Take 6.25 mg by mouth 2 (two) times daily with a meal., Disp: , Rfl:    chlorthalidone  (HYGROTON ) 25 MG tablet, Take 25 mg by mouth every other day., Disp: , Rfl:    Cholecalciferol  (VITAMIN D3) 125 MCG (5000 UT) TABS, Take 5,000 Units by mouth daily., Disp: , Rfl:    docusate sodium  (COLACE) 100 MG capsule, Take 100 mg by mouth daily., Disp: , Rfl:    Faricimab -svoa (VABYSMO  IZ), 1 Dose by Intravitreal route every 6 (six) weeks. Right eye, Disp: , Rfl:    gabapentin  (NEURONTIN ) 300 MG capsule, Take 300 mg by mouth at bedtime. Take up to 4 times per day (1200 mg) Per patient taking 2 (600mg ) at night,  Disp: , Rfl:    melatonin 5 MG TABS, Take 10 mg by mouth at bedtime., Disp: , Rfl:    omeprazole (PRILOSEC) 20 MG capsule, Take 20 mg by mouth daily., Disp: , Rfl: 8   OVER THE COUNTER MEDICATION, Take 1 tablet by mouth daily. Vision Gold, Disp: , Rfl:    rOPINIRole (REQUIP) 0.5 MG tablet, Take 0.5 mg by mouth at bedtime., Disp: , Rfl:    senna-docusate (SENOKOT S) 8.6-50 MG tablet, Take 1 tablet by mouth at bedtime as needed., Disp: 30 tablet, Rfl: 1   Study - VICTORION-1 PREVENT - inclisiran 300 mg/1.5mL or placebo SQ injection (PI-Stuckey), Inject 300 mg into the skin every 6 (six) months. For Investigational Use Only. Inject subcutaneously into the abdomen, upper arm or thigh every 6 months. Do not inject into areas of active skin disease,such as sun burns, skin rashes, inflammation, or skin infections.Please contact [facility] for any questions or concerns., Disp: , Rfl:    vitamin B-12 (CYANOCOBALAMIN) 250 MCG tablet, Take 2,500 mcg by mouth daily., Disp: , Rfl:    carvedilol  (COREG ) 3.125 MG tablet, Take 1 tablet (3.125 mg total) by mouth 2 (two) times daily. (Patient not taking: Reported on 04/02/2024), Disp: 60 tablet, Rfl: 2  Current Facility-Administered Medications:    Study - VICTORION-1 PREVENT - inclisiran 300 mg/1.5mL  or placebo SQ injection (PI-Stuckey), 300 mg, Subcutaneous, Q6 months, , 300 mg at 10/10/23 9051   Study - VICTORION-1 PREVENT - inclisiran 300 mg/1.5mL or placebo SQ injection (PI-Stuckey), 300 mg, Subcutaneous, Q6 months,

## 2024-04-11 ENCOUNTER — Ambulatory Visit: Admitting: Neurology

## 2024-04-11 ENCOUNTER — Encounter: Payer: Self-pay | Admitting: Neurology

## 2024-04-11 VITALS — BP 131/84 | HR 82 | Ht 66.0 in | Wt 153.0 lb

## 2024-04-11 DIAGNOSIS — M79605 Pain in left leg: Secondary | ICD-10-CM | POA: Diagnosis not present

## 2024-04-11 DIAGNOSIS — M79604 Pain in right leg: Secondary | ICD-10-CM | POA: Diagnosis not present

## 2024-04-11 DIAGNOSIS — R202 Paresthesia of skin: Secondary | ICD-10-CM

## 2024-04-11 DIAGNOSIS — G8929 Other chronic pain: Secondary | ICD-10-CM | POA: Diagnosis not present

## 2024-04-11 DIAGNOSIS — M5440 Lumbago with sciatica, unspecified side: Secondary | ICD-10-CM | POA: Diagnosis not present

## 2024-04-11 NOTE — Progress Notes (Signed)
 Chief Complaint  Patient presents with   New Patient (Initial Visit)    Room 15 -pt is alone, pt has since you in past lov 2016 Increasing weakness , having muscle weakness , she having dizziness ,  she had mri in may regarding double vision , having pain in feet ,swelling but has stopped with medication, she stated that it tightness. This comes and go, it last for about month, she has some urine freq. Has weight loss , she ihas some issues it finding words       ASSESSMENT AND PLAN  [name] is a 68 y.o. female   Known history of severe L4-5 stenosis Chronic low back pain, lower extremity achy pain, weakness, also intermittent upper extremity paresthesia weakness  EMG nerve conduction study for evaluation of upper extremity focal neuropathy, lumbar radiculopathy  Laboratory evaluation including CPK to rule out intrinsic muscle disease Cognitive impairment  Family history of central nervous system degenerative disorder  Laboratory evaluation including ATN profile     DIAGNOSTIC DATA (LABS, IMAGING, TESTING) - I reviewed patient records, labs, notes, testing and imaging myself where available.   MEDICAL HISTORY:  [name] is a 68 year old female, seen in request by her primary care from [facility] [name], for evaluation of lower extremity pain, weakness,    History is obtained from the patient and review of electronic medical records. I personally reviewed pertinent available imaging films in PACS.   PMHx of Restless leg syndrome Right Macular degeneration GERD HTN Chronic low back pain Right optic neuritis  Cervical decompression Lumbar decompression  Right knee replacement  I know [name] well, saw her in the past for her right optic neuritis,  She had a history of right retinal detachment in 2002, in early October 2016, she noticed blurry vision, color washing out, by July 10 2015, she realized when she close her left eye,  she saw dark clusters of gray discoloration in her right visual field,  was diagnosed with right optic neuritis,    Evaluated by [facility] ophthalmologist [name], confirmed her diagnosis of right optic neuritis , she also has a history of bilateral posterior vitreous detachment, hypertensive retinopathy of both eyes, she was treated with IV Solu-Medrol , followed by given prednisone  tapering dose, after that her vision has much improved, but not back to her baseline, she has developed significant side effect with long-term steroid treatment, under the guidance of Dr. Fairly, her steroid was gradually tapered off, started on methotrexate  initially, now maintained on low-dose of Imuran  50 mg daily  Worried about the possibility of underlying multiple sclerosis, she had  MRI of the brain with and without contrast October 2015 with patient, multi supratentorium lesions, few lesions are oval-shaped, oriented towards ventricle, no contrast enhancement. This certainly raises the possibility of relapsing remitting multiple sclerosis versus small vessel disease.  Lumbar puncture:WBC 1, RBC 1, total protein 44, glucose 57, 0 oligoclonal banding was negative.  MRI of the cervical in November third 2016 with patient: Severe spinal stenosis at C5-C6 with an AP diameter of 5.6 mm. There is subtle T2 hyperintense signal within the spinal cord below the point of maximal stenosis that could represent myelopathic changes associated with possible cord compression.   Mild spinal stenosis at C6-C7 that does not lead to any spinal cord changes or nerve root compression.  She will have anterior approach cervical decompression surgery in November 02 2015.      MRI of lumbar  February 2017: Multilevel degenerative changes, most severe at L4-5: disc bulging and facet hypertrophy with moderate-severe spinal stenosis and mild biforaminal narrowing. L3-4: disc bulging and facet hypertrophy with mild-moderate spinal stenosis and  no foraminal narrowing. Additional multi-level facet hypertrophy and disc bulging as above. Compared to MRI on 11/08/10, there has been some progressive worsening of degenerative spine disease and spinal stenosis.  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 Slight progression of multilevel lumbar spondylosis, worst at L4-L5, where there is moderate-to-severe spinal canal stenosis and moderate right neural foraminal narrowing, unchanged. 2. At L3-L4, mild spinal canal stenosis, unchanged. Far lateral disc displaces the exited left L3 nerve root in the extraforaminal zone, also unchanged.   She is also concerned about her memory loss, she is a retired [name]  MRI of the brain in April 2025 no acute abnormality, mild to moderate small vessel disease, compared to previous study in 2019, 2016, only mild progression  PHYSICAL EXAM:   Vitals:   04/11/24 0951  BP: 131/84  Pulse: 82  Weight: 153 lb (69.4 kg)  Height: 5' 6 (1.676 m)   Body mass index is 24.69 kg/m.  PHYSICAL EXAMNIATION:  Gen: NAD, conversant, well nourised, well groomed                     Cardiovascular: Regular rate rhythm, no peripheral edema, warm, nontender. Eyes: Conjunctivae clear without exudates or hemorrhage Neck: Supple, no  carotid bruits. Pulmonary: Clear to auscultation bilaterally   NEUROLOGICAL EXAM:  MENTAL STATUS: Speech/cognition: Awake, alert, oriented to history taking and casual conversation CRANIAL NERVES: CN II: Visual fields are full to confrontation. Pupils are round equal and briskly reactive to light. CN III, IV, VI: extraocular movement are normal. No ptosis. CN V: Facial sensation is intact to light touch CN VII: Face is symmetric with normal eye closure  CN VIII: Hearing is normal to causal conversation. CN IX, X: Phonation is normal. CN XI: Head turning and shoulder shrug are intact  MOTOR: Variable effort on examination, felt there was no significant upper or lower extremity proximal and distal weakness  REFLEXES: Reflexes are 2+ and symmetric at the biceps, triceps, knees, and ankles. Plantar responses are flexor.  SENSORY: Intact to light touch, pinprick and vibratory sensation are intact in fingers and toes.  COORDINATION: There is no trunk or limb dysmetria noted.  GAIT/STANCE: Push-up, cautious, Miller steady  REVIEW OF SYSTEMS:  Full 14 system review of systems performed and notable only for as above All other review of systems were negative.   ALLERGIES: Allergies  Allergen Reactions   Ezetimibe     Muscle pain   Rosuvastatin     Muscle pain   Prednisone  Anxiety    Couldn't sleep, anger, confusion    HOME MEDICATIONS: Current Outpatient Medications  Medication Sig Dispense Refill   aspirin  EC 81 MG tablet Take 81 mg by mouth daily. Swallow whole.     azaTHIOprine  (IMURAN ) 50 MG tablet Take 1 tablet (50 mg total) by mouth daily. 90 tablet 4   Calcium -Magnesium -Zinc 333-133-5 MG TABS Take 1 tablet by mouth daily.     carvedilol  (COREG ) 6.25 MG tablet Take 6.25 mg by mouth 2 (two) times daily with a meal.     chlorthalidone  (HYGROTON ) 25 MG tablet Take 25 mg by mouth every other day.     Cholecalciferol  (VITAMIN D3) 125 MCG (5000 UT) TABS Take 5,000 Units by  mouth daily.     docusate sodium  (COLACE) 100 MG capsule Take 100 mg by mouth daily.     Faricimab -svoa (VABYSMO  IZ) 1 Dose by Intravitreal route every 6 (six) weeks. Right eye     gabapentin  (NEURONTIN ) 300 MG capsule Take 300 mg by mouth at bedtime. Take up to 4 times per day (1200 mg) Per patient taking 2 (600mg ) at night     melatonin 5 MG TABS Take 10 mg by mouth at bedtime.     omeprazole (PRILOSEC) 20 MG capsule Take 20 mg by mouth daily.  8   OVER THE COUNTER MEDICATION Take 1 tablet by mouth daily. Vision Gold     rOPINIRole (REQUIP) 0.5 MG tablet Take 0.5 mg by mouth at bedtime.     senna-docusate (SENOKOT S) 8.6-50 MG tablet Take 1 tablet by mouth at bedtime as needed. 30 tablet 1   Study - VICTORION-1 PREVENT - inclisiran 300 mg/1.37mL or placebo SQ injection (PI-Stuckey) Inject 300 mg into the skin every 6 (six) months. For Investigational Use Only. Inject subcutaneously into the abdomen, upper arm or thigh every 6 months. Do not inject into areas of active skin disease,such as sun burns, skin rashes, inflammation, or skin infections.Please contact  [facility] for any questions or concerns.     vitamin B-12 (CYANOCOBALAMIN) 250 MCG tablet Take 2,500 mcg by mouth daily.     carvedilol  (COREG ) 3.125 MG tablet Take 1 tablet (3.125 mg total) by mouth 2 (two) times daily. (Patient not taking: Reported on 04/11/2024) 60 tablet 2   Current Facility-Administered Medications  Medication Dose Route Frequency Provider Last Rate Last Admin   Study - VICTORION-1 PREVENT - inclisiran 300 mg/1.5mL or placebo SQ injection (PI-Stuckey)  300 mg Subcutaneous Q6 months    300 mg at 10/10/23 9051   Study - VICTORION-1 PREVENT - inclisiran 300 mg/1.5mL or placebo SQ injection (PI-Stuckey)  300 mg Subcutaneous Q6 months    300 mg at 04/09/24 0855    PAST MEDICAL HISTORY: Past Medical History:  Diagnosis Date   Anxiety    Arthritis    Deviated septum    Dyspnea    with exertion    wheezing at night   Fibroid tumor    GERD (gastroesophageal reflux disease) 11/02/2015   Hepatitis    AS CHILD     Hypercholesteremia 07/21/2015   Hypertension 07/10/2015   Memory loss    Meniscus tear    Pneumonia    PONV (postoperative nausea and vomiting)    Restless leg syndrome 09/01/2015   Retinal detachment 03/06/2001   Spinal stenosis    Urinary frequency     PAST SURGICAL HISTORY: Past Surgical History:  Procedure Laterality Date   ANTERIOR CERVICAL DECOMP/DISCECTOMY FUSION N/A 11/02/2015   Procedure: ANTERIOR CERVICAL DECOMPRESSION FUSION CERVICAL FIVE-SIX,CERVICAL SIX-SEVEN;  Surgeon: Darina Boehringer, MD;  Location: [facility];  Service: Neurosurgery;  Laterality: N/A;  right side approach   BACK SURGERY     CATARACT EXTRACTION Bilateral    fibroid tumor     KNEE SURGERY Bilateral    RETINAL DETACHMENT SURGERY Right 03/06/2001   TOTAL KNEE ARTHROPLASTY Right 11/08/2017   TOTAL KNEE ARTHROPLASTY Right 11/08/2017   Procedure: RIGHT TOTAL KNEE ARTHROPLASTY;  Surgeon: Jerri Kay HERO, MD;  Location: [facility];  Service: Orthopedics;  Laterality: Right;    FAMILY HISTORY: Family History  Problem Relation Age of Onset   Stroke Mother    Arrhythmia Mother    Bladder Cancer Mother    Thyroid  disease Mother    Heart disease Father    Heart attack Father    Stroke Maternal Grandmother    Stroke Maternal Grandfather    Breast cancer Neg Hx     SOCIAL HISTORY: Social History   Socioeconomic History   Marital status: Single    Spouse name: Not on file   Number of children: 0   Years of education: Masters   Highest education level: Not on file  Occupational History   Occupation: caregiver    Comment: Part-time  Tobacco Use   Smoking status: Never   Smokeless tobacco: Never  Vaping Use   Vaping status: Never Used  Substance and Sexual Activity   Alcohol  use: No   Drug use: No   Sexual activity: Not on file  Other Topics Concern   Not on file  Social History  Narrative   Lives at home with her sister.   Right-handed.   1 cup coffee daily.   Social Drivers of Corporate investment banker Strain: Not on file  Food Insecurity: Not on file  Transportation Needs: Not on file  Physical Activity: Not on file  Stress: Not on file  Social Connections: Not on file  Intimate Partner Violence: Not on file      Modena Callander, M.D. Ph.D.  [facility] [address], [address] [place], [place] [address] Ph: [phone] Fax: [phone]  CC:  Shayne Anes, MD [address] [place],  [place] [address]  Shayne Anes, MD

## 2024-04-14 ENCOUNTER — Telehealth: Payer: Self-pay | Admitting: Neurology

## 2024-04-14 NOTE — Telephone Encounter (Signed)
 [facility] is going to take this patient.

## 2024-04-14 NOTE — H&P (Signed)
 CC/HPI: cc: Urolithiasis   03/03/2024: 68 year old woman comes in after CT scan was obtained showing a 2.5 cm left renal pelvis calculus with mild obstructive uropathy. Patient has had back pain on and off for the last 2 years. She also has spinal stenosis which makes it difficult to tell where the pain is coming from. She is currently undergoing a lot of evaluations including rheumatology, cardiology, and neurology. She has been experiencing weight loss and weakness. 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 She is on aspirin  daily. She has never had surgery for stones before.     ALLERGIES: ezetimibe Prednisone  Rosuvastatin    MEDICATIONS: Omeprazole 20 MG Tablet Delayed Release  Aspirin  81 81 MG Tablet Delayed Release  azaTHIOprine  50 MG Tablet  Carvedilol  12.5 MG Tablet  Centrum Minis Adults 50+ Tablet  Chlorthalidone  25 MG Tablet  Fiber  Gabapentin  300 MG Capsule  Iron  Melatonin  rOPINIRole HCl 5 MG Tablet  Vitamin D3     GU PSH: None   NON-GU PSH: Back Surgery (Unspecified) Cataract surgery Eye Surgery (Unspecified) Knee replacement Neck Surgery     GU PMH: None     PMH Notes:  1898-09-25 00:00:00 - Note: Normal Routine History And Physical Adult  Hepatitis    NON-GU PMH: Anxiety Arthritis GERD Hypercholesterolemia Hypertension    FAMILY HISTORY: Blood In Urine - Mother Heart Attack - Father, Grandmother stroke - Grandfather, Mother, Grandmother   SOCIAL HISTORY: Marital Status: Single Preferred Language: English; Ethnicity: Not Hispanic Or Latino Current Smoking Status: Patient has never smoked.   Tobacco Use Assessment Completed: Used Tobacco in last 30 days? Does not drink anymore.  Drinks 2 caffeinated drinks per day.    REVIEW OF SYSTEMS:    GU Review Female:   Patient reports frequent urination, hard to postpone urination, get up at night to urinate, leakage of urine, stream starts and stops, trouble starting your stream, and have to  strain to urinate. Patient denies burning /pain with urination and being pregnant.  Gastrointestinal (Upper):   Patient reports nausea. Patient denies vomiting and indigestion/ heartburn.  Gastrointestinal (Lower):   Patient reports diarrhea and constipation.   Constitutional:   Patient reports fever, night sweats, weight loss, and fatigue.   Skin:   Patient reports itching. Patient denies skin rash/ lesion.  Eyes:   Patient reports blurred vision and double vision.   Ears/ Nose/ Throat:   Patient denies sore throat and sinus problems.  Hematologic/Lymphatic:   Patient reports easy bruising. Patient denies swollen glands.  Cardiovascular:   Patient reports leg swelling and chest pains.   Respiratory:   Patient reports shortness of breath. Patient denies cough.  Endocrine:   Patient denies excessive thirst.  Musculoskeletal:   Patient reports back pain and joint pain.   Neurological:   Patient reports headaches and dizziness.   Psychologic:   Patient reports anxiety. Patient denies depression.   VITAL SIGNS:      03/03/2024 01:22 PM  Weight 156.8 lb / 71.12 kg  Height 66 in / 167.64 cm  BP 106/67 mmHg  Pulse 75 /min  Temperature 97.3 F / 36.2 C  BMI 25.3 kg/m   MULTI-SYSTEM PHYSICAL EXAMINATION:    Constitutional: Well-nourished. No physical deformities. Normally developed. Good grooming.  Neck: Neck symmetrical, not swollen. Normal tracheal position.  Respiratory: No labored breathing, no use of accessory muscles.   Skin: No paleness, no jaundice, no cyanosis. No lesion, no ulcer, no rash.  Neurologic / Psychiatric:  Oriented to time, oriented to place, oriented to person. No depression, no anxiety, no agitation.  Eyes: Normal conjunctivae. Normal eyelids.  Ears, Nose, Mouth, and Throat: Left ear no scars, no lesions, no masses. Right ear no scars, no lesions, no masses. Nose no scars, no lesions, no masses. Normal hearing. Normal lips.  Musculoskeletal: Normal gait and station of  head and neck.     Complexity of Data:  Records Review:   Previous Patient Records, POC Tool  Urine Test Review:   Urinalysis  X-Ray Review: C.T. Abdomen/Pelvis: Reviewed Films. Reviewed Report. Discussed With Patient. MPRESSION:   2.4 cm stone in the left renal pelvis with mild hydronephrosis.   Other smaller bilateral nonobstructing intrarenal stones.    PROCEDURES:          Visit Complexity - G2211          Urinalysis w/Scope Dipstick Dipstick Cont'd Micro  Color: Yellow Bilirubin: Neg mg/dL WBC/hpf: 0 - 5/hpf  Appearance: Slightly Cloudy Ketones: Trace mg/dL RBC/hpf: 3 - 89/yeq  Specific Gravity: 1.025 Blood: 1+ ery/uL Bacteria: Few (10-25/hpf)  pH: 6.0 Protein: 2+ mg/dL Cystals: NS (Not Seen)  Glucose: Neg mg/dL Urobilinogen: 1.0 mg/dL Casts: NS (Not Seen)    Nitrites: Neg Trichomonas: Not Present    Leukocyte Esterase: Neg leu/uL Mucous: Present      Epithelial Cells: 0 - 5/hpf      Yeast: NS (Not Seen)      Sperm: Not Present    ASSESSMENT:      ICD-10 Details  1 GU:   Renal calculus - N20.0 Chronic, Stable   PLAN:           Document Letter(s):  Created for Patient: Clinical Summary         Notes:   1. Urolithiasis:  - Discussed patient CT scan with her as well as management options of the large left renal stone. She is not a good candidate for ESWL based on the size of the stone. We did discuss PCNL versus staged ureteroscopy. Patient wishes to avoid any hospitalization due to her being the primary caregiver for her sister and 3 dogs. She would very much like to avoid PCNL if possible.  - We discussed risks and benefits of staged ureteroscopy with laser lithotripsy and stent placement. These include but are not limited to pain, bleeding, infection, need for additional procedures, stent discomfort, damage to surrounding structures, ureteral stricture. Patient understands his risks and wishes to proceed.  - Will schedule ureteroscopy 2 to 3 weeks apart from each  other

## 2024-04-15 ENCOUNTER — Ambulatory Visit (HOSPITAL_COMMUNITY)
Admission: RE | Admit: 2024-04-15 | Discharge: 2024-04-15 | Disposition: A | Discharge status: Home / Self Care | Source: Ambulatory Visit | Attending: Urology | Admitting: Urology

## 2024-04-15 ENCOUNTER — Encounter (HOSPITAL_COMMUNITY): Payer: Self-pay | Admitting: Physician Assistant

## 2024-04-15 ENCOUNTER — Encounter (HOSPITAL_COMMUNITY): Payer: Self-pay | Admitting: Urology

## 2024-04-15 ENCOUNTER — Ambulatory Visit (HOSPITAL_COMMUNITY)

## 2024-04-15 ENCOUNTER — Encounter (HOSPITAL_COMMUNITY)
Admission: RE | Disposition: A | Discharge status: Home / Self Care | Payer: Self-pay | Source: Ambulatory Visit | Attending: Urology

## 2024-04-15 ENCOUNTER — Ambulatory Visit (HOSPITAL_COMMUNITY): Admitting: Anesthesiology

## 2024-04-15 DIAGNOSIS — M4800 Spinal stenosis, site unspecified: Secondary | ICD-10-CM | POA: Diagnosis not present

## 2024-04-15 DIAGNOSIS — Z7982 Long term (current) use of aspirin: Secondary | ICD-10-CM | POA: Diagnosis not present

## 2024-04-15 DIAGNOSIS — D649 Anemia, unspecified: Secondary | ICD-10-CM | POA: Diagnosis not present

## 2024-04-15 DIAGNOSIS — N200 Calculus of kidney: Secondary | ICD-10-CM

## 2024-04-15 DIAGNOSIS — M1990 Unspecified osteoarthritis, unspecified site: Secondary | ICD-10-CM | POA: Diagnosis not present

## 2024-04-15 DIAGNOSIS — I739 Peripheral vascular disease, unspecified: Secondary | ICD-10-CM | POA: Insufficient documentation

## 2024-04-15 DIAGNOSIS — I10 Essential (primary) hypertension: Secondary | ICD-10-CM | POA: Diagnosis not present

## 2024-04-15 DIAGNOSIS — R634 Abnormal weight loss: Secondary | ICD-10-CM | POA: Insufficient documentation

## 2024-04-15 DIAGNOSIS — Z79899 Other long term (current) drug therapy: Secondary | ICD-10-CM | POA: Diagnosis not present

## 2024-04-15 DIAGNOSIS — R531 Weakness: Secondary | ICD-10-CM | POA: Diagnosis not present

## 2024-04-15 DIAGNOSIS — N201 Calculus of ureter: Secondary | ICD-10-CM | POA: Diagnosis not present

## 2024-04-15 DIAGNOSIS — I2510 Atherosclerotic heart disease of native coronary artery without angina pectoris: Secondary | ICD-10-CM | POA: Diagnosis not present

## 2024-04-15 DIAGNOSIS — G4730 Sleep apnea, unspecified: Secondary | ICD-10-CM | POA: Diagnosis not present

## 2024-04-15 DIAGNOSIS — F419 Anxiety disorder, unspecified: Secondary | ICD-10-CM | POA: Diagnosis not present

## 2024-04-15 DIAGNOSIS — N132 Hydronephrosis with renal and ureteral calculous obstruction: Secondary | ICD-10-CM | POA: Diagnosis not present

## 2024-04-15 DIAGNOSIS — K219 Gastro-esophageal reflux disease without esophagitis: Secondary | ICD-10-CM | POA: Insufficient documentation

## 2024-04-15 DIAGNOSIS — Z6824 Body mass index (BMI) 24.0-24.9, adult: Secondary | ICD-10-CM | POA: Diagnosis not present

## 2024-04-15 HISTORY — PX: CYSTOSCOPY/URETEROSCOPY/HOLMIUM LASER/STENT PLACEMENT: SHX6546

## 2024-04-15 SURGERY — CYSTOSCOPY/URETEROSCOPY/HOLMIUM LASER/STENT PLACEMENT
Anesthesia: General | Laterality: Left

## 2024-04-15 MED ORDER — TRAMADOL HCL 50 MG PO TABS: 50.0000 mg | ORAL_TABLET | Freq: Four times a day (QID) | ORAL | 0 refills | Status: DC | PRN

## 2024-04-15 MED ORDER — OXYCODONE HCL 5 MG/5ML PO SOLN: 5.0000 mg | Freq: Once | ORAL | Status: AC | PRN

## 2024-04-15 MED ORDER — OXYBUTYNIN CHLORIDE 5 MG PO TABS: 5.0000 mg | ORAL_TABLET | Freq: Three times a day (TID) | ORAL | 3 refills | Status: DC | PRN

## 2024-04-15 MED ORDER — OXYCODONE HCL 5 MG PO TABS
5.0000 mg | ORAL_TABLET | Freq: Once | ORAL | Status: AC | PRN
  Administered 2024-04-15: 5 mg via ORAL

## 2024-04-15 MED ORDER — ONDANSETRON HCL 4 MG PO TABS: 4.0000 mg | ORAL_TABLET | Freq: Three times a day (TID) | ORAL | 0 refills | Status: DC | PRN

## 2024-04-15 MED ORDER — CEFAZOLIN SODIUM-DEXTROSE 2-4 GM/100ML-% IV SOLN
2.0000 g | INTRAVENOUS | Status: DC
  Filled 2024-04-15: qty 100

## 2024-04-15 MED ORDER — DEXAMETHASONE SODIUM PHOSPHATE 10 MG/ML IJ SOLN
INTRAMUSCULAR | Status: AC
  Filled 2024-04-15: qty 1

## 2024-04-15 MED ORDER — FENTANYL CITRATE PF 50 MCG/ML IJ SOSY
PREFILLED_SYRINGE | INTRAMUSCULAR | Status: AC
Start: 2024-04-15 — End: 2024-04-15
  Filled 2024-04-15: qty 1

## 2024-04-15 MED ORDER — ORAL CARE MOUTH RINSE
15.0000 mL | Freq: Once | OROMUCOSAL | Status: AC
Start: 2024-04-15 — End: 2024-04-15
  Administered 2024-04-15: 15 mL via OROMUCOSAL

## 2024-04-15 MED ORDER — LACTATED RINGERS IV SOLN: INTRAVENOUS | Status: DC

## 2024-04-15 MED ORDER — FENTANYL CITRATE PF 50 MCG/ML IJ SOSY
PREFILLED_SYRINGE | INTRAMUSCULAR | Status: AC
  Filled 2024-04-15: qty 1

## 2024-04-15 MED ORDER — CEFAZOLIN SODIUM-DEXTROSE 2-4 GM/100ML-% IV SOLN: 2.0000 g | INTRAVENOUS | Status: DC

## 2024-04-15 MED ORDER — FENTANYL CITRATE (PF) 100 MCG/2ML IJ SOLN
INTRAMUSCULAR | Status: DC | PRN
  Administered 2024-04-15 (×4): 25 ug via INTRAVENOUS

## 2024-04-15 MED ORDER — ONDANSETRON HCL 4 MG/2ML IJ SOLN
INTRAMUSCULAR | Status: AC
  Filled 2024-04-15: qty 2

## 2024-04-15 MED ORDER — LIDOCAINE 2% (20 MG/ML) 5 ML SYRINGE
INTRAMUSCULAR | Status: DC | PRN
  Administered 2024-04-15: 80 mg via INTRAVENOUS

## 2024-04-15 MED ORDER — ONDANSETRON HCL 4 MG/2ML IJ SOLN
INTRAMUSCULAR | Status: DC | PRN
  Administered 2024-04-15: 4 mg via INTRAVENOUS

## 2024-04-15 MED ORDER — SODIUM CHLORIDE 0.9 % IR SOLN
Status: DC | PRN
  Administered 2024-04-15: 6000 mL via INTRAVESICAL

## 2024-04-15 MED ORDER — AMISULPRIDE (ANTIEMETIC) 5 MG/2ML IV SOLN: 10.0000 mg | Freq: Once | INTRAVENOUS | Status: DC | PRN

## 2024-04-15 MED ORDER — TAMSULOSIN HCL 0.4 MG PO CAPS: 0.4000 mg | ORAL_CAPSULE | Freq: Every day | ORAL | 0 refills | Status: DC

## 2024-04-15 MED ORDER — FENTANYL CITRATE PF 50 MCG/ML IJ SOSY
25.0000 ug | PREFILLED_SYRINGE | INTRAMUSCULAR | Status: DC | PRN
  Administered 2024-04-15 (×2): 50 ug via INTRAVENOUS

## 2024-04-15 MED ORDER — FENTANYL CITRATE (PF) 100 MCG/2ML IJ SOLN
INTRAMUSCULAR | Status: AC
Start: 2024-04-15 — End: 2024-04-15
  Filled 2024-04-15: qty 2

## 2024-04-15 MED ORDER — IOHEXOL 300 MG/ML  SOLN
INTRAMUSCULAR | Status: DC | PRN
  Administered 2024-04-15: 10 mL

## 2024-04-15 MED ORDER — CHLORHEXIDINE GLUCONATE 0.12 % MT SOLN: 15.0000 mL | Freq: Once | OROMUCOSAL | Status: AC

## 2024-04-15 MED ORDER — DEXAMETHASONE SODIUM PHOSPHATE 4 MG/ML IJ SOLN
INTRAMUSCULAR | Status: DC | PRN
  Administered 2024-04-15: 5 mg via INTRAVENOUS

## 2024-04-15 MED ORDER — LIDOCAINE HCL (PF) 2 % IJ SOLN
INTRAMUSCULAR | Status: AC
  Filled 2024-04-15: qty 5

## 2024-04-15 MED ORDER — PROPOFOL 10 MG/ML IV BOLUS
INTRAVENOUS | Status: DC | PRN
  Administered 2024-04-15: 125 ug/kg/min via INTRAVENOUS
  Administered 2024-04-15: 200 mg via INTRAVENOUS
  Administered 2024-04-15: 20 mg via INTRAVENOUS
  Administered 2024-04-15 (×2): 30 mg via INTRAVENOUS

## 2024-04-15 MED ORDER — OXYCODONE HCL 5 MG PO TABS
ORAL_TABLET | ORAL | Status: AC
  Filled 2024-04-15: qty 1

## 2024-04-15 MED ORDER — ACETAMINOPHEN 500 MG PO TABS
1000.0000 mg | ORAL_TABLET | Freq: Once | ORAL | Status: AC
  Administered 2024-04-15: 1000 mg via ORAL

## 2024-04-15 SURGICAL SUPPLY — 19 items
BAG URO CATCHER STRL LF (MISCELLANEOUS) ×1 IMPLANT
BASKET ZERO TIP NITINOL 2.4FR (BASKET) IMPLANT
CATH URETL OPEN 5X70 (CATHETERS) ×1 IMPLANT
CLOTH BEACON ORANGE TIMEOUT ST (SAFETY) ×1 IMPLANT
DRSG TEGADERM 2-3/8X2-3/4 SM (GAUZE/BANDAGES/DRESSINGS) IMPLANT
FIBER LASER MOSES 200 DFL (Laser) IMPLANT
GLOVE BIO SURGEON STRL SZ 6.5 (GLOVE) ×1 IMPLANT
GOWN STRL REUS W/ TWL LRG LVL3 (GOWN DISPOSABLE) ×1 IMPLANT
GUIDEWIRE STR DUAL SENSOR (WIRE) ×1 IMPLANT
KIT TURNOVER KIT A (KITS) ×1 IMPLANT
LASER FIB FLEXIVA PULSE ID 365 (Laser) IMPLANT
MANIFOLD NEPTUNE II (INSTRUMENTS) ×1 IMPLANT
PACK CYSTO (CUSTOM PROCEDURE TRAY) ×1 IMPLANT
SHEATH NAVIGATOR HD 11/13X28 (SHEATH) IMPLANT
SHEATH NAVIGATOR HD 11/13X36 (SHEATH) IMPLANT
STENT URET 6FRX26 CONTOUR (STENTS) IMPLANT
TRACTIP FLEXIVA PULS ID 200XHI (Laser) IMPLANT
TUBING CONNECTING 10 (TUBING) ×1 IMPLANT
TUBING UROLOGY SET (TUBING) ×1 IMPLANT

## 2024-04-15 NOTE — Anesthesia Postprocedure Evaluation (Signed)
 Anesthesia Post Note  Patient: [name]  Procedure(s) Performed: CYSTOSCOPY/URETEROSCOPY/HOLMIUM LASER/STENT PLACEMENT (Left)     Patient location during evaluation: PACU Anesthesia Type: General Level of consciousness: awake and alert Pain management: pain level controlled Vital Signs Assessment: post-procedure vital signs reviewed and stable Respiratory status: spontaneous breathing, nonlabored ventilation, respiratory function stable and patient connected to nasal cannula oxygen Cardiovascular status: blood pressure returned to baseline and stable Postop Assessment: no apparent nausea or vomiting Anesthetic complications: no   No notable events documented.  Last Vitals:  Vitals:   04/15/24 1215 04/15/24 1238  BP: (!) 151/75 (!) 132/103  Pulse: 64 64  Resp: 15 20  Temp:  36.4 C  SpO2: 100% 100%    Last Pain:  Vitals:   04/15/24 1238  TempSrc: Oral  PainSc: 0-No pain                 Cordella P Jcion Buddenhagen

## 2024-04-15 NOTE — Discharge Instructions (Addendum)
 DISCHARGE INSTRUCTIONS FOR KIDNEY STONE/URETERAL STENT   MEDICATIONS:  1. Resume all your other meds from home  2. AZO over the counter can help with the burning/stinging when you urinate. 3. Tramadol  is for moderate/severe pain, otherwise taking up to 1000 mg every 6 hours of plainTylenol will help treat your pain.   4. Oxybutynin  can help with bladder cramping and tamsulosin  can help with stent discomfort   ACTIVITY:  1. No strenuous activity x 1week  2. No driving while on narcotic pain medications  3. Drink plenty of water  4. Continue to walk at home - you can still get blood clots when you are at home, so keep active, but don't over do it.  5. May return to work/school tomorrow or when you feel ready   BATHING:  1. You can shower and we recommend daily showers  2. You have a string coming from your urethra: The stent string is attached to your ureteral stent. Do not pull on this.   SIGNS/SYMPTOMS TO CALL:  Please call us  if you have a fever greater than 101.5, uncontrolled nausea/vomiting, uncontrolled pain, dizziness, unable to urinate, bloody urine, chest pain, shortness of breath, leg swelling, leg pain, redness around wound, drainage from wound, or any other concerns or questions.   You can reach us  at [phone].   FOLLOW-UP:  1. You have a second surgery scheduled on 05/08/24.

## 2024-04-15 NOTE — Transfer of Care (Signed)
 Immediate Anesthesia Transfer of Care Note  Patient: [name]  Procedure(s) Performed: Procedure(s) with comments: CYSTOSCOPY/URETEROSCOPY/HOLMIUM LASER/STENT PLACEMENT (Left) - CYSTOSCOPY/LEFT URETEROSCOPY/HOLMIUM LASER/STENT PLACEMENT  Patient Location: PACU  Anesthesia Type:General  Level of Consciousness: Patient easily awoken, comfortable, cooperative, following commands, responds to stimulation.   Airway & Oxygen Therapy: Patient spontaneously breathing, ventilating well, oxygen via simple oxygen mask.  Post-op Assessment: Report given to PACU RN, vital signs reviewed and stable, moving all extremities.   Post vital signs: Reviewed and stable.  Complications: No apparent anesthesia complications Last Vitals:  Vitals Value Taken Time  BP 140/73 04/15/24 11:25  Temp    Pulse 62 04/15/24 11:26  Resp 19 04/15/24 11:26  SpO2 100 % 04/15/24 11:26  Vitals shown include unfiled device data.  Last Pain:  Vitals:   04/15/24 0758  TempSrc:   PainSc: 0-No pain         Complications: No notable events documented.

## 2024-04-15 NOTE — Anesthesia Procedure Notes (Signed)
 Procedure Name: LMA Insertion Date/Time: 04/15/2024 9:50 AM  Performed by: Joshua Vernell BROCKS, CRNAPre-anesthesia Checklist: Patient identified, Emergency Drugs available, Suction available and Patient being monitored Patient Re-evaluated:Patient Re-evaluated prior to induction Oxygen Delivery Method: Circle system utilized Preoxygenation: Pre-oxygenation with 100% oxygen Induction Type: IV induction Ventilation: Mask ventilation without difficulty LMA: LMA with gastric port inserted LMA Size: 4.0 Number of attempts: 2 Placement Confirmation: positive ETCO2 and breath sounds checked- equal and bilateral Tube secured with: Tape Dental Injury: Teeth and Oropharynx as per pre-operative assessment  Comments: First attempt with regular LMA size 4, no EtCO2. Changed to LMA with gastric port size 4, + EtCo2

## 2024-04-15 NOTE — Interval H&P Note (Signed)
 History and Physical Interval Note:  04/15/2024 8:20 AM  [name]  has presented today for surgery, with the diagnosis of LEFT RENAL CALCULUS.  The various methods of treatment have been discussed with the patient and family. After consideration of risks, benefits and other options for treatment, the patient has consented to  Procedure(s) with comments: CYSTOSCOPY/URETEROSCOPY/HOLMIUM LASER/STENT PLACEMENT (Left) - CYSTOSCOPY/LEFT URETEROSCOPY/HOLMIUM LASER/STENT PLACEMENT as a surgical intervention.  The patient's history has been reviewed, patient examined, no change in status, stable for surgery.  I have reviewed the patient's chart and labs.  Questions were answered to the patient's satisfaction.     Recia Sons D Kawehi Hostetter

## 2024-04-15 NOTE — Op Note (Signed)
 Preoperative diagnosis: left renal calculus  Postoperative diagnosis: left renal calculus  Procedure:  Cystoscopy left ureteroscopy, laser lithotripsy, basket stone extraction left 46F x 26 ureteral stent placement  left retrograde pyelography with interpretation  Surgeon: Valli Shank, MD  Anesthesia: General  Complications: None  Intraoperative findings:  Normal urethra Bilateral orthotropic ureteral orifices left retrograde pyelography demonstrated a filling defect within the left lower pole consistent with the patient's known calculus without other abnormalities. Bladder mucosa normal without masses   EBL: Minimal  Specimens: left renal calculus  Disposition of specimens: [facility] for stone analysis  Indication: [name] is a 68 y.o.   patient with a 2.5cm left ureteral stone and associated left symptoms. She was unable to proceed with PCNL due to caregiver limitations and responsibilities at home.  We discussed proceeding with staged procedure. After reviewing the management options for treatment, the patient elected to proceed with the above surgical procedure(s). We have discussed the potential benefits and risks of the procedure, side effects of the proposed treatment, the likelihood of the patient achieving the goals of the procedure, and any potential problems that might occur during the procedure or recuperation. Informed consent has been obtained.   Description of procedure:  The patient was taken to the operating room and general anesthesia was induced.  The patient was placed in the dorsal lithotomy position, prepped and draped in the usual sterile fashion, and preoperative antibiotics were administered. A preoperative time-out was performed.   Cystourethroscopy was performed.  The patient's urethra was examined and was normal.The bladder was then systematically examined in its entirety. There was no evidence for any bladder tumors,  stones, or other mucosal pathology.    Attention then turned to the left ureteral orifice and a ureteral catheter was used to intubate the ureteral orifice.  Omnipaque  contrast was injected through the ureteral catheter and a retrograde pyelogram was performed with findings as dictated above.  A 0.38 sensor guidewire was then advanced up the left ureter into the renal pelvis under fluoroscopic guidance. A second wire was advanced alongside the first wire and up to the kidney with fluoroscopic guidance.  A ureteral access sheath was advanced over the working wire up to the proximal ureter with fluoroscopic guidance.  The inner sheath and wire were removed.  Flexible ureteroscopy then took place.  The large renal calculus was seen in the lower pole partially into the renal pelvis.   The stone was then fragmented with the 242 micron holmium laser fiber.  Attempts at using a 365 m holmium laser fiber were unsuccessful due to limitations in deflection.  Ultimately to 242 m holmium laser fibers were required due to the size of the stone.  Lithotripsy continued until visibility was poor.    Some of the stone fragments were removed with the ZeroTip basket.  This was going to be a staged procedure and after visibility became poor the decision was made to end the case.  The ureteroscope was removed in unison with the access sheath taking care to examine the ureter on the way out.  No trauma or injury was noted to the ureter.  The wire was then backloaded through the cystoscope and a ureteral stent was advance over the wire using Seldinger technique.  The stent was positioned appropriately under fluoroscopic and cystoscopic guidance.  The wire was then removed with an adequate stent curl noted in the renal pelvis as well as in the bladder.  The bladder was then emptied and  the procedure ended.  The patient appeared to tolerate the procedure well and without complications.  The patient was able to be awakened and  transferred to the recovery unit in satisfactory condition.   Disposition: Patient has staged procedure scheduled due to the size of the stone.

## 2024-04-16 ENCOUNTER — Encounter (HOSPITAL_COMMUNITY): Payer: Self-pay | Admitting: Urology

## 2024-04-16 NOTE — Telephone Encounter (Signed)
 [name] said they have tried calling her twice without answer.

## 2024-04-28 ENCOUNTER — Other Ambulatory Visit: Payer: Self-pay | Admitting: Urology

## 2024-04-28 NOTE — Progress Notes (Signed)
 Surgery orders requested via Epic inbox.

## 2024-05-01 ENCOUNTER — Other Ambulatory Visit (HOSPITAL_COMMUNITY): Payer: Self-pay

## 2024-05-01 NOTE — Progress Notes (Signed)
 Second request for pre op orders, left voicemail with Nena.

## 2024-05-02 NOTE — Progress Notes (Signed)
 Please place orders for PAT appointment scheduled 05/05/24.

## 2024-05-02 NOTE — Progress Notes (Signed)
 COVID Vaccine Completed:  Date of COVID positive in last 90 days:  PCP - Oneil Neth, MD Cardiologist - Candyce Reek, MD Neurology- Modena Callander, MD LOV 04/11/24  Chest x-ray -  EKG - 05/14/23 Epic Stress Test - 04/19/16 Epic ECHO - 04/19/16 Epic Cardiac Cath -  Pacemaker/ICD device last checked: Spinal Cord Stimulator:  Bowel Prep -   Sleep Study -  CPAP -   Fasting Blood Sugar -  Checks Blood Sugar _____ times a day  Last dose of GLP1 agonist-  N/A GLP1 instructions:  Do not take after     Last dose of SGLT-2 inhibitors-  N/A SGLT-2 instructions:  Do not take after     Blood Thinner Instructions:  Last dose:   Time: Aspirin  Instructions: ASA 81 Last Dose:  Activity level:  Can go up a flight of stairs and perform activities of daily living without stopping and without symptoms of chest pain or shortness of breath.  Able to exercise without symptoms  Unable to go up a flight of stairs without symptoms of     Anesthesia review: CAD, HTN, OSA, anemia  Patient denies shortness of breath, fever, cough and chest pain at PAT appointment  Patient verbalized understanding of instructions that were given to them at the PAT appointment. Patient was also instructed that they will need to review over the PAT instructions again at home before surgery.

## 2024-05-05 ENCOUNTER — Other Ambulatory Visit: Payer: Self-pay

## 2024-05-05 ENCOUNTER — Encounter (HOSPITAL_COMMUNITY): Payer: Self-pay

## 2024-05-05 ENCOUNTER — Encounter (HOSPITAL_COMMUNITY)
Admission: RE | Admit: 2024-05-05 | Discharge: 2024-05-05 | Disposition: A | Discharge status: Home / Self Care | Source: Ambulatory Visit | Attending: Urology | Admitting: Urology

## 2024-05-05 DIAGNOSIS — I2510 Atherosclerotic heart disease of native coronary artery without angina pectoris: Secondary | ICD-10-CM | POA: Insufficient documentation

## 2024-05-05 DIAGNOSIS — Z01818 Encounter for other preprocedural examination: Secondary | ICD-10-CM | POA: Insufficient documentation

## 2024-05-05 HISTORY — DX: Personal history of urinary calculi: Z87.442

## 2024-05-05 LAB — BASIC METABOLIC PANEL WITH GFR
Anion gap: 11 (ref 5–15)
BUN: 19 mg/dL (ref 8–23)
CO2: 27 mmol/L (ref 22–32)
Calcium: 9.8 mg/dL (ref 8.9–10.3)
Chloride: 102 mmol/L (ref 98–111)
Creatinine, Ser: 0.91 mg/dL (ref 0.44–1.00)
GFR, Estimated: >60 mL/min (ref >60)
Glucose, Bld: 109 mg/dL — ABNORMAL HIGH (ref 70–99)
Potassium: 4.3 mmol/L (ref 3.5–5.1)
Sodium: 140 mmol/L (ref 135–145)

## 2024-05-05 LAB — CBC
HCT: 40 % (ref 36.0–46.0)
Hemoglobin: 13 g/dL (ref 12.0–15.0)
MCH: 32.2 pg (ref 26.0–34.0)
MCHC: 32.5 g/dL (ref 30.0–36.0)
MCV: 99 fL (ref 80.0–100.0)
Platelets: 286 K/uL (ref 150–400)
RBC: 4.04 MIL/uL (ref 3.87–5.11)
RDW: 13.2 % (ref 11.5–15.5)
WBC: 6.9 K/uL (ref 4.0–10.5)
nRBC: 0 % (ref 0.0–0.2)

## 2024-05-05 NOTE — Progress Notes (Signed)
 COVID Vaccine Completed:   Date of COVID positive in last 90 days:   PCP - Oneil Neth, MD Cardiologist - Candyce Reek, MD Neurology- Modena Callander, MD LOV 04/11/24   Chest x-ray - n/a EKG - 05/14/23 Epic Stress Test - 04/19/16 Epic ECHO - 04/19/16 Epic Cardiac Cath - n/a Pacemaker/ICD device last checked: n/a Spinal Cord Stimulator: n/a   Bowel Prep - no   Sleep Study - yes CPAP - no   Fasting Blood Sugar - n/a Checks Blood Sugar _____ times a day   Last dose of GLP1 agonist-  N/A GLP1 instructions:  Do not take after     Last dose of SGLT-2 inhibitors-  N/A SGLT-2 instructions:  Do not take after       Blood Thinner Instructions:  Last dose:                      Time: Aspirin  Instructions: ASA 81, on hold for surgery. Holding 5 days Last Dose:   Activity level:   Can and perform activities of daily living without stopping and without symptoms of chest pain or shortness of breath. Gets winded doing stairs. Patient gets worn out easily               Anesthesia review: CAD, HTN, OSA, anemia   Patient denies shortness of breath, fever, cough and chest pain at PAT appointment   Patient verbalized understanding of instructions that were given to them at the PAT appointment. Patient was also instructed that they will need to review over the PAT instructions again at home before surgery

## 2024-05-05 NOTE — Patient Instructions (Signed)
 SURGICAL WAITING ROOM VISITATION  Patients having surgery or a procedure may have no more than 2 support people in the waiting area - these visitors may rotate.    Children under the age of 84 must have an adult with them who is not the patient.  Visitors with respiratory illnesses are discouraged from visiting and should remain at home.  If the patient needs to stay at the hospital during part of their recovery, the visitor guidelines for inpatient rooms apply. Pre-op nurse will coordinate an appropriate time for 1 support person to accompany patient in pre-op.  This support person may not rotate.    Please refer to the [facility] website for the visitor guidelines for Inpatients (after your surgery is over and you are in a regular room).    Your procedure is scheduled on: 05/08/24   Report to [facility] Main Entrance    Report to admitting at 6:45 AM   Call this number if you have problems the morning of surgery [phone]   Do not eat food or drink liquids :After Midnight.          If you have questions, please contact your surgeon's office.   FOLLOW BOWEL PREP AND ANY ADDITIONAL PRE OP INSTRUCTIONS YOU RECEIVED FROM YOUR SURGEON'S OFFICE!!!     Oral Hygiene is also important to reduce your risk of infection.                                    Remember - BRUSH YOUR TEETH THE MORNING OF SURGERY WITH YOUR REGULAR TOOTHPASTE  DENTURES WILL BE REMOVED PRIOR TO SURGERY PLEASE DO NOT APPLY Poly grip OR ADHESIVES!!!   Stop all vitamins and herbal supplements 7 days before surgery.   Take these medicines the morning of surgery with A SIP OF WATER : Gabapentin , Carvedilol , Omeprazole                               You may not have any metal on your body including hair pins, jewelry, and body piercing             Do not wear make-up, lotions, powders, perfumes, or deodorant  Do not wear nail polish including gel and S&S, artificial/acrylic nails, or any other type of  covering on natural nails including finger and toenails. If you have artificial nails, gel coating, etc. that needs to be removed by a nail salon please have this removed prior to surgery or surgery may need to be canceled/ delayed if the surgeon/ anesthesia feels like they are unable to be safely monitored.   Do not shave  48 hours prior to surgery.    Do not bring valuables to the hospital. [facility] IS NOT             RESPONSIBLE   FOR VALUABLES.   Contacts, glasses, dentures or bridgework may not be worn into surgery.  DO NOT BRING YOUR HOME MEDICATIONS TO THE HOSPITAL. PHARMACY WILL DISPENSE MEDICATIONS LISTED ON YOUR MEDICATION LIST TO YOU DURING YOUR ADMISSION IN THE HOSPITAL!    Patients discharged on the day of surgery will not be allowed to drive home.  Someone NEEDS to stay with you for the first 24 hours after anesthesia.   Special Instructions: Bring a copy of your healthcare power of attorney and living will documents the day of  surgery if you haven't scanned them before.              Please read over the following fact sheets you were given: IF YOU HAVE QUESTIONS ABOUT YOUR PRE-OP INSTRUCTIONS PLEASE CALL [phone].   If you received a COVID test during your pre-op visit  it is requested that you wear a mask when out in public, stay away from anyone that may not be feeling well and notify your surgeon if you develop symptoms. If you test positive for Covid or have been in contact with anyone that has tested positive in the last 10 days please notify you surgeon.    [facility] - Preparing for Surgery Before surgery, you can play an important role.  Because skin is not sterile, your skin needs to be as free of germs as possible.  You can reduce the number of germs on your skin by washing with CHG (chlorahexidine gluconate) soap before surgery.  CHG is an antiseptic cleaner which kills germs and bonds with the skin to continue killing germs even after washing. Please  DO NOT use if you have an allergy to CHG or antibacterial soaps.  If your skin becomes reddened/irritated stop using the CHG and inform your nurse when you arrive at Short Stay. Do not shave (including legs and underarms) for at least 48 hours prior to the first CHG shower.  You may shave your face/neck.  Please follow these instructions carefully:  1.  Shower with CHG Soap the night before surgery and the  morning of surgery.  2.  If you choose to wash your hair, wash your hair first as usual with your normal  shampoo.  3.  After you shampoo, rinse your hair and body thoroughly to remove the shampoo.                             4.  Use CHG as you would any other liquid soap.  You can apply chg directly to the skin and wash.  Gently with a scrungie or clean washcloth.  5.  Apply the CHG Soap to your body ONLY FROM THE NECK DOWN.   Do   not use on face/ open                           Wound or open sores. Avoid contact with eyes, ears mouth and   genitals (private parts).                       Wash face,  Genitals (private parts) with your normal soap.             6.  Wash thoroughly, paying special attention to the area where your    surgery  will be performed.  7.  Thoroughly rinse your body with warm water  from the neck down.  8.  DO NOT shower/wash with your normal soap after using and rinsing off the CHG Soap.                9.  Pat yourself dry with a clean towel.            10.  Wear clean pajamas.            11.  Place clean sheets on your bed the night of your first shower and do not  sleep with pets. Day of  Surgery : Do not apply any lotions/deodorants the morning of surgery.  Please wear clean clothes to the hospital/surgery center.  FAILURE TO FOLLOW THESE INSTRUCTIONS MAY RESULT IN THE CANCELLATION OF YOUR SURGERY  PATIENT SIGNATURE_________________________________  NURSE  SIGNATURE__________________________________  ________________________________________________________________________

## 2024-05-06 NOTE — Anesthesia Preprocedure Evaluation (Addendum)
 Anesthesia Evaluation  Patient identified by MRN, date of birth, ID band Patient awake    Reviewed: Allergy & Precautions, NPO status , Patient's Chart, lab work & pertinent test results, reviewed documented beta blocker date and time   History of Anesthesia Complications (+) PONV and history of anesthetic complications  Airway Mallampati: II  TM Distance: >3 FB     Dental no notable dental hx.    Pulmonary shortness of breath and with exertion, sleep apnea , pneumonia   breath sounds clear to auscultation       Cardiovascular hypertension, + angina  + CAD (nonobstructive) and + Peripheral Vascular Disease  (-) Past MI and (-) Cardiac Stents  Rhythm:Regular Rate:Normal     Neuro/Psych neg Seizures  Anxiety      Neuromuscular disease    GI/Hepatic ,GERD  Medicated and Controlled,,(+) Hepatitis -  Endo/Other    Renal/GU Renal disease     Musculoskeletal  (+) Arthritis ,    Abdominal   Peds  Hematology  (+) Blood dyscrasia, anemia   Anesthesia Other Findings   Reproductive/Obstetrics                              Anesthesia Physical Anesthesia Plan  ASA: 2  Anesthesia Plan: General   Post-op Pain Management:    Induction: Intravenous  PONV Risk Score and Plan: 4 or greater and Ondansetron  and TIVA  Airway Management Planned: LMA  Additional Equipment:   Intra-op Plan:   Post-operative Plan: Extubation in OR  Informed Consent: I have reviewed the patients History and Physical, chart, labs and discussed the procedure including the risks, benefits and alternatives for the proposed anesthesia with the patient or authorized representative who has indicated his/her understanding and acceptance.     Dental advisory given  Plan Discussed with: CRNA  Anesthesia Plan Comments: (See PAT note from 7/11. Went to OR on 7/22 for same surgery. No complications noted. Now scheduled for 2nd  stage procedure.)         Anesthesia Quick Evaluation

## 2024-05-07 ENCOUNTER — Encounter (HOSPITAL_COMMUNITY): Payer: Self-pay | Admitting: Urology

## 2024-05-07 NOTE — H&P (Signed)
 CC/HPI: cc: Urolithiasis   03/03/2024: 68 year old woman comes in after CT scan was obtained showing a 2.5 cm left renal pelvis calculus with mild obstructive uropathy. Patient has had back pain on and off for the last 2 years. She also has spinal stenosis which makes it difficult to tell where the pain is coming from. She is currently undergoing a lot of evaluations including rheumatology, cardiology, and neurology. She has been experiencing weight loss and weakness. 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 She is on aspirin  daily. She has never had surgery for stones before.     ALLERGIES: ezetimibe Prednisone  Rosuvastatin    MEDICATIONS: Omeprazole 20 MG Tablet Delayed Release  Aspirin  81 81 MG Tablet Delayed Release  azaTHIOprine  50 MG Tablet  Carvedilol  12.5 MG Tablet  Centrum Minis Adults 50+ Tablet  Chlorthalidone  25 MG Tablet  Fiber  Gabapentin  300 MG Capsule  Iron  Melatonin  rOPINIRole HCl 5 MG Tablet  Vitamin D3     GU PSH: None   NON-GU PSH: Back Surgery (Unspecified) Cataract surgery Eye Surgery (Unspecified) Knee replacement Neck Surgery     GU PMH: None     PMH Notes:  1898-09-25 00:00:00 - Note: Normal Routine History And Physical Adult  Hepatitis    NON-GU PMH: Anxiety Arthritis GERD Hypercholesterolemia Hypertension    FAMILY HISTORY: Blood In Urine - Mother Heart Attack - Father, Grandmother stroke - Grandfather, Mother, Grandmother   SOCIAL HISTORY: Marital Status: Single Preferred Language: English; Ethnicity: Not Hispanic Or Latino Current Smoking Status: Patient has never smoked.   Tobacco Use Assessment Completed: Used Tobacco in last 30 days? Does not drink anymore.  Drinks 2 caffeinated drinks per day.    REVIEW OF SYSTEMS:    GU Review Female:   Patient reports frequent urination, hard to postpone urination, get up at night to urinate, leakage of urine, stream starts and stops, trouble starting your stream, and have to  strain to urinate. Patient denies burning /pain with urination and being pregnant.  Gastrointestinal (Upper):   Patient reports nausea. Patient denies vomiting and indigestion/ heartburn.  Gastrointestinal (Lower):   Patient reports diarrhea and constipation.   Constitutional:   Patient reports fever, night sweats, weight loss, and fatigue.   Skin:   Patient reports itching. Patient denies skin rash/ lesion.  Eyes:   Patient reports blurred vision and double vision.   Ears/ Nose/ Throat:   Patient denies sore throat and sinus problems.  Hematologic/Lymphatic:   Patient reports easy bruising. Patient denies swollen glands.  Cardiovascular:   Patient reports leg swelling and chest pains.   Respiratory:   Patient reports shortness of breath. Patient denies cough.  Endocrine:   Patient denies excessive thirst.  Musculoskeletal:   Patient reports back pain and joint pain.   Neurological:   Patient reports headaches and dizziness.   Psychologic:   Patient reports anxiety. Patient denies depression.   VITAL SIGNS:      03/03/2024 01:22 PM  Weight 156.8 lb / 71.12 kg  Height 66 in / 167.64 cm  BP 106/67 mmHg  Pulse 75 /min  Temperature 97.3 F / 36.2 C  BMI 25.3 kg/m   MULTI-SYSTEM PHYSICAL EXAMINATION:    Constitutional: Well-nourished. No physical deformities. Normally developed. Good grooming.  Neck: Neck symmetrical, not swollen. Normal tracheal position.  Respiratory: No labored breathing, no use of accessory muscles.   Skin: No paleness, no jaundice, no cyanosis. No lesion, no ulcer, no rash.  Neurologic / Psychiatric:  Oriented to time, oriented to place, oriented to person. No depression, no anxiety, no agitation.  Eyes: Normal conjunctivae. Normal eyelids.  Ears, Nose, Mouth, and Throat: Left ear no scars, no lesions, no masses. Right ear no scars, no lesions, no masses. Nose no scars, no lesions, no masses. Normal hearing. Normal lips.  Musculoskeletal: Normal gait and station of  head and neck.     Complexity of Data:  Records Review:   Previous Patient Records, POC Tool  Urine Test Review:   Urinalysis  X-Ray Review: C.T. Abdomen/Pelvis: Reviewed Films. Reviewed Report. Discussed With Patient. MPRESSION:   2.4 cm stone in the left renal pelvis with mild hydronephrosis.   Other smaller bilateral nonobstructing intrarenal stones.    PROCEDURES:          Visit Complexity - G2211          Urinalysis w/Scope Dipstick Dipstick Cont'd Micro  Color: Yellow Bilirubin: Neg mg/dL WBC/hpf: 0 - 5/hpf  Appearance: Slightly Cloudy Ketones: Trace mg/dL RBC/hpf: 3 - 89/yeq  Specific Gravity: 1.025 Blood: 1+ ery/uL Bacteria: Few (10-25/hpf)  pH: 6.0 Protein: 2+ mg/dL Cystals: NS (Not Seen)  Glucose: Neg mg/dL Urobilinogen: 1.0 mg/dL Casts: NS (Not Seen)    Nitrites: Neg Trichomonas: Not Present    Leukocyte Esterase: Neg leu/uL Mucous: Present      Epithelial Cells: 0 - 5/hpf      Yeast: NS (Not Seen)      Sperm: Not Present    ASSESSMENT:      ICD-10 Details  1 GU:   Renal calculus - N20.0 Chronic, Stable   PLAN:           Document Letter(s):  Created for Patient: Clinical Summary         Notes:   1. Urolithiasis:  - Discussed patient CT scan with her as well as management options of the large left renal stone. She is not a good candidate for ESWL based on the size of the stone. We did discuss PCNL versus staged ureteroscopy. Patient wishes to avoid any hospitalization due to her being the primary caregiver for her sister and 3 dogs. She would very much like to avoid PCNL if possible.  - We discussed risks and benefits of staged ureteroscopy with laser lithotripsy and stent placement. These include but are not limited to pain, bleeding, infection, need for additional procedures, stent discomfort, damage to surrounding structures, ureteral stricture. Patient understands his risks and wishes to proceed.  - Will schedule ureteroscopy 2 to 3 weeks apart from each  other

## 2024-05-08 ENCOUNTER — Encounter (HOSPITAL_COMMUNITY): Payer: Self-pay | Admitting: Urology

## 2024-05-08 ENCOUNTER — Ambulatory Visit (HOSPITAL_BASED_OUTPATIENT_CLINIC_OR_DEPARTMENT_OTHER): Payer: Self-pay | Admitting: Anesthesiology

## 2024-05-08 ENCOUNTER — Other Ambulatory Visit: Payer: Self-pay

## 2024-05-08 ENCOUNTER — Encounter (HOSPITAL_COMMUNITY)
Admission: RE | Disposition: A | Discharge status: Home / Self Care | Payer: Self-pay | Source: Home / Self Care | Attending: Urology

## 2024-05-08 ENCOUNTER — Ambulatory Visit (HOSPITAL_COMMUNITY): Payer: Self-pay | Admitting: Medical

## 2024-05-08 ENCOUNTER — Ambulatory Visit (HOSPITAL_COMMUNITY)

## 2024-05-08 ENCOUNTER — Ambulatory Visit (HOSPITAL_COMMUNITY)
Admission: RE | Admit: 2024-05-08 | Discharge: 2024-05-08 | Disposition: A | Discharge status: Home / Self Care | Attending: Urology | Admitting: Urology

## 2024-05-08 DIAGNOSIS — D649 Anemia, unspecified: Secondary | ICD-10-CM | POA: Diagnosis not present

## 2024-05-08 DIAGNOSIS — I739 Peripheral vascular disease, unspecified: Secondary | ICD-10-CM | POA: Diagnosis not present

## 2024-05-08 DIAGNOSIS — I2510 Atherosclerotic heart disease of native coronary artery without angina pectoris: Secondary | ICD-10-CM | POA: Insufficient documentation

## 2024-05-08 DIAGNOSIS — G4730 Sleep apnea, unspecified: Secondary | ICD-10-CM | POA: Diagnosis not present

## 2024-05-08 DIAGNOSIS — I10 Essential (primary) hypertension: Secondary | ICD-10-CM | POA: Diagnosis not present

## 2024-05-08 DIAGNOSIS — N201 Calculus of ureter: Secondary | ICD-10-CM | POA: Diagnosis not present

## 2024-05-08 DIAGNOSIS — K219 Gastro-esophageal reflux disease without esophagitis: Secondary | ICD-10-CM | POA: Insufficient documentation

## 2024-05-08 DIAGNOSIS — F419 Anxiety disorder, unspecified: Secondary | ICD-10-CM | POA: Diagnosis not present

## 2024-05-08 DIAGNOSIS — N202 Calculus of kidney with calculus of ureter: Secondary | ICD-10-CM | POA: Insufficient documentation

## 2024-05-08 DIAGNOSIS — Z8249 Family history of ischemic heart disease and other diseases of the circulatory system: Secondary | ICD-10-CM | POA: Diagnosis not present

## 2024-05-08 DIAGNOSIS — M1990 Unspecified osteoarthritis, unspecified site: Secondary | ICD-10-CM | POA: Insufficient documentation

## 2024-05-08 DIAGNOSIS — N200 Calculus of kidney: Secondary | ICD-10-CM | POA: Diagnosis not present

## 2024-05-08 DIAGNOSIS — G709 Myoneural disorder, unspecified: Secondary | ICD-10-CM | POA: Diagnosis not present

## 2024-05-08 HISTORY — PX: CYSTOSCOPY/URETEROSCOPY/HOLMIUM LASER/STENT PLACEMENT: SHX6546

## 2024-05-08 SURGERY — CYSTOSCOPY/URETEROSCOPY/HOLMIUM LASER/STENT PLACEMENT
Anesthesia: General | Laterality: Left

## 2024-05-08 MED ORDER — FENTANYL CITRATE (PF) 100 MCG/2ML IJ SOLN
INTRAMUSCULAR | Status: DC | PRN
  Administered 2024-05-08 (×2): 50 ug via INTRAVENOUS

## 2024-05-08 MED ORDER — IOHEXOL 300 MG/ML  SOLN
INTRAMUSCULAR | Status: DC | PRN
  Administered 2024-05-08: 50 mL

## 2024-05-08 MED ORDER — FENTANYL CITRATE (PF) 100 MCG/2ML IJ SOLN
INTRAMUSCULAR | Status: AC
  Filled 2024-05-08: qty 2

## 2024-05-08 MED ORDER — PROPOFOL 10 MG/ML IV BOLUS
INTRAVENOUS | Status: DC | PRN
Start: 2024-05-08 — End: 2024-05-08
  Administered 2024-05-08: 120 mg via INTRAVENOUS
  Administered 2024-05-08: 125 ug/kg/min via INTRAVENOUS

## 2024-05-08 MED ORDER — LACTATED RINGERS IV SOLN: INTRAVENOUS | Status: DC

## 2024-05-08 MED ORDER — EPHEDRINE SULFATE (PRESSORS) 50 MG/ML IJ SOLN
INTRAMUSCULAR | Status: DC | PRN
  Administered 2024-05-08: 10 mg via INTRAVENOUS

## 2024-05-08 MED ORDER — FENTANYL CITRATE PF 50 MCG/ML IJ SOSY
PREFILLED_SYRINGE | INTRAMUSCULAR | Status: AC
Start: 2024-05-08 — End: 2024-05-08
  Filled 2024-05-08: qty 2

## 2024-05-08 MED ORDER — TRAMADOL HCL 50 MG PO TABS: 50.0000 mg | ORAL_TABLET | Freq: Four times a day (QID) | ORAL | 0 refills | Status: DC | PRN

## 2024-05-08 MED ORDER — EPHEDRINE 5 MG/ML INJ
INTRAVENOUS | Status: AC
  Filled 2024-05-08: qty 5

## 2024-05-08 MED ORDER — ONDANSETRON HCL 4 MG/2ML IJ SOLN: 4.0000 mg | Freq: Once | INTRAMUSCULAR | Status: DC | PRN

## 2024-05-08 MED ORDER — OXYCODONE HCL 5 MG/5ML PO SOLN: 5.0000 mg | Freq: Once | ORAL | Status: AC | PRN

## 2024-05-08 MED ORDER — OXYCODONE HCL 5 MG PO TABS
5.0000 mg | ORAL_TABLET | Freq: Once | ORAL | Status: AC | PRN
  Administered 2024-05-08: 5 mg via ORAL

## 2024-05-08 MED ORDER — PROPOFOL 10 MG/ML IV BOLUS
INTRAVENOUS | Status: AC
  Filled 2024-05-08: qty 20

## 2024-05-08 MED ORDER — ONDANSETRON HCL 4 MG/2ML IJ SOLN
INTRAMUSCULAR | Status: DC | PRN
  Administered 2024-05-08: 4 mg via INTRAVENOUS

## 2024-05-08 MED ORDER — SODIUM CHLORIDE 0.9 % IR SOLN
Status: DC | PRN
  Administered 2024-05-08: 3000 mL

## 2024-05-08 MED ORDER — STERILE WATER FOR IRRIGATION IR SOLN
Status: DC | PRN
  Administered 2024-05-08: 1000 mL

## 2024-05-08 MED ORDER — FENTANYL CITRATE PF 50 MCG/ML IJ SOSY
PREFILLED_SYRINGE | INTRAMUSCULAR | Status: AC
  Filled 2024-05-08: qty 1

## 2024-05-08 MED ORDER — ACETAMINOPHEN 10 MG/ML IV SOLN: 1000.0000 mg | Freq: Once | INTRAVENOUS | Status: DC | PRN

## 2024-05-08 MED ORDER — ORAL CARE MOUTH RINSE: 15.0000 mL | Freq: Once | OROMUCOSAL | Status: AC

## 2024-05-08 MED ORDER — CEPHALEXIN 250 MG PO CAPS: 250.0000 mg | ORAL_CAPSULE | Freq: Every day | ORAL | 0 refills | Status: DC

## 2024-05-08 MED ORDER — OXYCODONE HCL 5 MG PO TABS
ORAL_TABLET | ORAL | Status: AC
  Filled 2024-05-08: qty 1

## 2024-05-08 MED ORDER — CEFAZOLIN SODIUM-DEXTROSE 2-4 GM/100ML-% IV SOLN
2.0000 g | INTRAVENOUS | Status: AC
  Administered 2024-05-08: 2 g via INTRAVENOUS
  Filled 2024-05-08: qty 100

## 2024-05-08 MED ORDER — ROCURONIUM BROMIDE 10 MG/ML (PF) SYRINGE
PREFILLED_SYRINGE | INTRAVENOUS | Status: AC
  Filled 2024-05-08: qty 10

## 2024-05-08 MED ORDER — LIDOCAINE HCL (CARDIAC) PF 100 MG/5ML IV SOSY
PREFILLED_SYRINGE | INTRAVENOUS | Status: DC | PRN
  Administered 2024-05-08: 80 mg via INTRAVENOUS

## 2024-05-08 MED ORDER — FENTANYL CITRATE PF 50 MCG/ML IJ SOSY
25.0000 ug | PREFILLED_SYRINGE | INTRAMUSCULAR | Status: DC | PRN
  Administered 2024-05-08 (×3): 50 ug via INTRAVENOUS

## 2024-05-08 MED ORDER — CHLORHEXIDINE GLUCONATE 0.12 % MT SOLN
15.0000 mL | Freq: Once | OROMUCOSAL | Status: AC
  Administered 2024-05-08: 15 mL via OROMUCOSAL

## 2024-05-08 SURGICAL SUPPLY — 19 items
BAG URO CATCHER STRL LF (MISCELLANEOUS) ×1 IMPLANT
BASKET ZERO TIP NITINOL 2.4FR (BASKET) IMPLANT
CATH URETERAL DUAL LUMEN 10F (MISCELLANEOUS) IMPLANT
CATH URETL OPEN 5X70 (CATHETERS) ×1 IMPLANT
CLOTH BEACON ORANGE TIMEOUT ST (SAFETY) ×1 IMPLANT
DRSG TEGADERM 2-3/8X2-3/4 SM (GAUZE/BANDAGES/DRESSINGS) IMPLANT
FIBER LASER MOSES 200 DFL (Laser) IMPLANT
GLOVE BIO SURGEON STRL SZ 6.5 (GLOVE) ×1 IMPLANT
GOWN STRL REUS W/ TWL LRG LVL3 (GOWN DISPOSABLE) ×1 IMPLANT
GUIDEWIRE STR DUAL SENSOR (WIRE) ×1 IMPLANT
KIT TURNOVER KIT A (KITS) ×1 IMPLANT
MANIFOLD NEPTUNE II (INSTRUMENTS) ×1 IMPLANT
PACK CYSTO (CUSTOM PROCEDURE TRAY) ×1 IMPLANT
SHEATH NAVIGATOR HD 11/13X28 (SHEATH) IMPLANT
SHEATH NAVIGATOR HD 11/13X36 (SHEATH) IMPLANT
STENT URET 6FRX26 CONTOUR (STENTS) IMPLANT
TRACTIP FLEXIVA PULS ID 200XHI (Laser) IMPLANT
TUBING CONNECTING 10 (TUBING) ×1 IMPLANT
TUBING UROLOGY SET (TUBING) ×1 IMPLANT

## 2024-05-08 NOTE — Op Note (Signed)
 Preoperative diagnosis: left renal calculus   Postoperative diagnosis: left renal calculus   Procedure:   Cystoscopy left ureteroscopy, laser lithotripsy, basket stone extraction left 87F x 26 ureteral stent exchange - no string left retrograde pyelography with interpretation   Surgeon: Valli Shank, MD   Anesthesia: General   Complications: None   Intraoperative findings:  Normal urethra Bilateral orthotropic ureteral orifices left retrograde pyelography demonstrated extravasation from the proximal ureter.  The open-ended ureteral catheter was then repositioned over the second wire and retrograde pyelogram demonstrated filling of the renal pelvis and calyces Bladder mucosa normal without masses    EBL: Minimal   Specimens: left renal calculus   Disposition of specimens: [facility] for stone analysis   Indication: [name] is a 68 y.o.   patient with a 2.5cm left ureteral stone and associated left symptoms.  She is here for staged procedure and removal of residual stone fragments.  She was unable to proceed with PCNL due to caregiver limitations and responsibilities at home.  We discussed proceeding with staged procedure. After reviewing the management options for treatment, the patient elected to proceed with the above surgical procedure(s). We have discussed the potential benefits and risks of the procedure, side effects of the proposed treatment, the likelihood of the patient achieving the goals of the procedure, and any potential problems that might occur during the procedure or recuperation. Informed consent has been obtained.     Description of procedure:   The patient was taken to the operating room and general anesthesia was induced.  The patient was placed in the dorsal lithotomy position, prepped and draped in the usual sterile fashion, and preoperative antibiotics were administered. A preoperative time-out was performed.    Cystourethroscopy  was performed.  The patient's urethra was examined and was normal.The bladder was then systematically examined in its entirety. There was no evidence for any bladder tumors, stones, or other mucosal pathology.     Attention then turned to the left ureteral orifice and graspers were used to bring the existing ureteral stent to the urethral meatus.  A 0.38 sensor was then advanced through the ureteral stent and up to the kidney.  The stent was discarded.  A second wire was placed alongside the first wire and advanced to the kidney with fluoroscopic guidance.  Next a ureteral access sheath was advanced to the mid ureter until some gentle resistance was met.  The inner sheath and wire removed.    Flexible ureteroscopy then took place and stone fragments were seen in the proximal ureter.  They were noted to be small and a ZeroTip basket was then used to try and remove some of the stone fragments so that the kidney could be examined.  Resistance was met and some of the stone fragments appeared to be extruded outside of the ureter.  The sensor wire was then replaced through the access sheath and the access sheath was removed.  An open-ended ureteral catheter was advanced over the wire and a retrograde pyelogram was obtained.  This was showed extravasation outside of the ureter however the the safety wire appeared to be in appropriate position.  The open ureteral catheter was removed and a dual-lumen catheter was advanced over the safety wire up to the kidney.  A retrograde pyelogram at this point confirmed location within the kidney.  A second sensor wire was advanced through the dual-lumen catheter and the dual-lumen catheter was removed.  The safety wire was maintained in place.  The  ureteral access sheath again was placed over the second wire to the proximal ureter.  It was able to easily traverse the area where stones were extruded.  Flexible ureteroscopy took place and residual stone fragments were seen in the  lower pole.    The stone was then fragmented with the 200 micron holmium laser fiber.  ZeroTip basket was then used to remove the residual stone fragments.  Basketing continued to lower no larger stone fragments seen greater than 1 to 2 mm.  The ureteroscope was removed in unison with the access sheath taking care to examine the ureter on the way out.  The area of extruded stones was no longer visible however there was trauma noted to the ureter at that area.   The wire was then backloaded through the cystoscope and a ureteral stent was advance over the wire using Seldinger technique.  The stent was positioned appropriately under fluoroscopic and cystoscopic guidance.  The wire was then removed with an adequate stent curl noted in the renal pelvis as well as in the bladder.   The bladder was then emptied and the procedure ended.  The patient appeared to tolerate the procedure well and without complications.  The patient was able to be awakened and transferred to the recovery unit in satisfactory condition.    Disposition: The stent will remain in place for 6 weeks.  She will have stent removal in the office with cystoscopy and then a renal ultrasound to 6 weeks later to evaluate for ureteral stricture.

## 2024-05-08 NOTE — Anesthesia Procedure Notes (Addendum)
 Procedure Name: LMA Insertion Date/Time: 05/08/2024 9:35 AM  Performed by: Vincenzo Show, CRNAPre-anesthesia Checklist: Patient identified, Emergency Drugs available, Suction available, Patient being monitored and Timeout performed Patient Re-evaluated:Patient Re-evaluated prior to induction Oxygen Delivery Method: Circle system utilized Preoxygenation: Pre-oxygenation with 100% oxygen Induction Type: IV induction Ventilation: Mask ventilation without difficulty LMA: LMA inserted and LMA with gastric port inserted LMA Size: 4.0 Tube size: 4.0 mm Number of attempts: 2 Placement Confirmation: positive ETCO2 and breath sounds checked- equal and bilateral Tube secured with: Tape Dental Injury: Teeth and Oropharynx as per pre-operative assessment  Comments: First attempt did not seat well.  Second by [employer].

## 2024-05-08 NOTE — Interval H&P Note (Signed)
 History and Physical Interval Note:  05/08/2024 8:41 AM  [name]  has presented today for surgery, with the diagnosis of LEFT RENAL CALCULUS.  The various methods of treatment have been discussed with the patient and family. After consideration of risks, benefits and other options for treatment, the patient has consented to  Procedure(s) with comments: CYSTOSCOPY/URETEROSCOPY/HOLMIUM LASER/STENT PLACEMENT (Left) - CYSTOSCOPY/LEFT URETEROSCOPY/HOLMIUM LASER/STENT EXCHANGE as a surgical intervention.  The patient's history has been reviewed, patient examined, no change in status, stable for surgery.  I have reviewed the patient's chart and labs.  Questions were answered to the patient's satisfaction.     Matt Delpizzo D Seraphina Mitchner

## 2024-05-08 NOTE — Anesthesia Postprocedure Evaluation (Signed)
 Anesthesia Post Note  Patient: [name]  Procedure(s) Performed: CYSTOSCOPY/URETEROSCOPY/HOLMIUM LASER/STENT PLACEMENT (Left)     Patient location during evaluation: PACU Anesthesia Type: General Level of consciousness: awake and alert Pain management: pain level controlled Vital Signs Assessment: post-procedure vital signs reviewed and stable Respiratory status: spontaneous breathing, nonlabored ventilation, respiratory function stable and patient connected to nasal cannula oxygen Cardiovascular status: blood pressure returned to baseline and stable Postop Assessment: no apparent nausea or vomiting Anesthetic complications: no   No notable events documented.  Last Vitals:  Vitals:   05/08/24 1245 05/08/24 1315  BP: (!) 154/74 (!) 147/71  Pulse: 65 65  Resp: 14 12  Temp:    SpO2: 96% 97%    Last Pain:  Vitals:   05/08/24 1315  TempSrc:   PainSc: 3                  Lynwood MARLA Cornea

## 2024-05-08 NOTE — Transfer of Care (Signed)
 Immediate Anesthesia Transfer of Care Note  Patient: [name]  Procedure(s) Performed: Procedure(s) with comments: CYSTOSCOPY/URETEROSCOPY/HOLMIUM LASER/STENT PLACEMENT (Left) - CYSTOSCOPY/LEFT URETEROSCOPY/HOLMIUM LASER/STENT EXCHANGE  Patient Location: PACU  Anesthesia Type:General  Level of Consciousness:  sedated, patient cooperative and responds to stimulation  Airway & Oxygen Therapy:Patient Spontanous Breathing and Patient connected to face mask oxgen  Post-op Assessment:  Report given to PACU RN and Post -op Vital signs reviewed and stable  Post vital signs:  Reviewed and stable  Last Vitals:  Vitals:   05/08/24 0805  BP: 134/77  Pulse: 72  Resp: 16  Temp: 36.8 C  SpO2: 97%    Complications: No apparent anesthesia complications

## 2024-05-08 NOTE — Discharge Instructions (Addendum)
 DISCHARGE INSTRUCTIONS FOR KIDNEY STONE/URETERAL STENT   MEDICATIONS:  1. Resume all your other meds from home  2. AZO over the counter can help with the burning/stinging when you urinate. 3. Tramadol  is for moderate/severe pain, otherwise taking up to 1000 mg every 6 hours of plainTylenol will help treat your pain.     ACTIVITY:  1. No strenuous activity x 1week  2. No driving while on narcotic pain medications  3. Drink plenty of water   4. Continue to walk at home - you can still get blood clots when you are at home, so keep active, but don't over do it.  5. May return to work/school tomorrow or when you feel ready   BATHING:  1. You can shower and we recommend daily showers   SIGNS/SYMPTOMS TO CALL:  Please call us  if you have a fever greater than 101.5, uncontrolled nausea/vomiting, uncontrolled pain, dizziness, unable to urinate, bloody urine, chest pain, shortness of breath, leg swelling, leg pain, redness around wound, drainage from wound, or any other concerns or questions.   You can reach us  at [phone].   FOLLOW-UP:  1. Your stent will stay in place for 6 weeks to allow the kidney and ureter to heal.

## 2024-05-09 ENCOUNTER — Encounter (HOSPITAL_COMMUNITY): Payer: Self-pay | Admitting: Urology

## 2024-05-14 ENCOUNTER — Ambulatory Visit: Attending: Cardiology | Admitting: Cardiovascular Disease

## 2024-05-14 VITALS — BP 132/84 | HR 58 | Ht 66.0 in | Wt 153.0 lb

## 2024-05-14 DIAGNOSIS — I10 Essential (primary) hypertension: Secondary | ICD-10-CM | POA: Diagnosis not present

## 2024-05-14 DIAGNOSIS — I2510 Atherosclerotic heart disease of native coronary artery without angina pectoris: Secondary | ICD-10-CM | POA: Diagnosis not present

## 2024-05-14 DIAGNOSIS — M79605 Pain in left leg: Secondary | ICD-10-CM | POA: Diagnosis not present

## 2024-05-14 DIAGNOSIS — M79604 Pain in right leg: Secondary | ICD-10-CM

## 2024-05-14 NOTE — Patient Instructions (Addendum)
 Medication Instructions:  The current medical regimen is effective;  continue present plan and medications.  *If you need a refill on your cardiac medications before your next appointment, please call your pharmacy*  Lab Work: NONE  If you have labs (blood work) drawn today and your tests are completely normal, you will receive your results only by: MyChart Message (if you have MyChart) OR A paper copy in the mail If you have any lab test that is abnormal or we need to change your treatment, we will call you to review the results.  Testing/Procedures: ABI Your physician has requested that you have an ankle brachial index (ABI). During this test an ultrasound and blood pressure cuff are used to evaluate the arteries that supply the arms and legs with blood. Allow thirty minutes for this exam. There are no restrictions or special instructions.  Please note: We ask at that you not bring children with you during ultrasound (echo/ vascular) testing. Due to room size and safety concerns, children are not allowed in the ultrasound rooms during exams. Our front office staff cannot provide observation of children in our lobby area while testing is being conducted. An adult accompanying a patient to their appointment will only be allowed in the ultrasound room at the discretion of the ultrasound technician under special circumstances. We apologize for any inconvenience.   Lower extremity arterial Duplex Your physician has requested that you have a lower or upper extremity arterial duplex. This test is an ultrasound of the arteries in the legs or arms. It looks at arterial blood flow in the legs and arms. Allow one hour for Lower and Upper Arterial scans. There are no restrictions or special instructions.  Please note: We ask at that you not bring children with you during ultrasound (echo/ vascular) testing. Due to room size and safety concerns, children are not allowed in the ultrasound rooms during exams.  Our front office staff cannot provide observation of children in our lobby area while testing is being conducted. An adult accompanying a patient to their appointment will only be allowed in the ultrasound room at the discretion of the ultrasound technician under special circumstances. We apologize for any inconvenience.   Follow-Up: At [facility], you and your health needs are our priority.  As part of our continuing mission to provide you with exceptional heart care, our providers are all part of one team.  This team includes your primary Cardiologist (physician) and Advanced Practice Providers or APPs (Physician Assistants and Nurse Practitioners) who all work together to provide you with the care you need, when you need it.  Your next appointment:   12 months  Provider:   Lonni Cash, MD    We recommend signing up for the patient portal called MyChart.  Sign up information is provided on this After Visit Summary.  MyChart is used to connect with patients for Virtual Visits (Telemedicine).  Patients are able to view lab/test results, encounter notes, upcoming appointments, etc.  Non-urgent messages can be sent to your provider as well.   To learn more about what you can do with MyChart, go to [email or web address].      Other:   Nutrition Referral

## 2024-05-14 NOTE — Progress Notes (Signed)
 Chief Complaint  Patient presents with   Follow-up    CAD   History of Present Illness: 68 yo female with history of HTN, HLD, anxiety and CAD who is here today for follow up. She has been followed in our office by [name]. Coronary CTA in August 2023 with moderate disease in the LAD and Circumflex that was not flow limiting by CT FFR. Echo in 2017 with normal LV systolic function. She has chronic LE edema but does not tolerate compression stockings.   She is here today for follow up. The patient denies any chest pain, dyspnea, palpitations, lower extremity edema, orthopnea, PND, dizziness, near syncope or syncope. She has bilateral leg pain with ambulation.   Primary Care Physician: Cheryl Anes, MD   Past Medical History:  Diagnosis Date   Anxiety    Arthritis    Deviated septum    Dyspnea    with exertion   wheezing at night   Fibroid tumor    GERD (gastroesophageal reflux disease) 11/02/2015   Hepatitis    AS CHILD     History of kidney stones    Hypercholesteremia 07/21/2015   Hypertension 07/10/2015   Memory loss    Meniscus tear    Pneumonia    PONV (postoperative nausea and vomiting)    Restless leg syndrome 09/01/2015   Retinal detachment 03/06/2001   Spinal stenosis    Urinary frequency     Past Surgical History:  Procedure Laterality Date   ANTERIOR CERVICAL DECOMP/DISCECTOMY FUSION N/A 11/02/2015   Procedure: ANTERIOR CERVICAL DECOMPRESSION FUSION CERVICAL FIVE-SIX,CERVICAL SIX-SEVEN;  Surgeon: Cheryl Boehringer, MD;  Location: [facility];  Service: Neurosurgery;  Laterality: N/A;  right side approach   BACK SURGERY     mild   CATARACT EXTRACTION Bilateral    CYSTOSCOPY/URETEROSCOPY/HOLMIUM LASER/STENT PLACEMENT Left 04/15/2024   Procedure: CYSTOSCOPY/URETEROSCOPY/HOLMIUM LASER/STENT PLACEMENT;  Surgeon: Cheryl Valli BIRCH, MD;  Location: [facility];  Service: Urology;  Laterality: Left;  CYSTOSCOPY/LEFT URETEROSCOPY/HOLMIUM LASER/STENT PLACEMENT    CYSTOSCOPY/URETEROSCOPY/HOLMIUM LASER/STENT PLACEMENT Left 05/08/2024   Procedure: CYSTOSCOPY/URETEROSCOPY/HOLMIUM LASER/STENT PLACEMENT;  Surgeon: Cheryl Valli BIRCH, MD;  Location: [facility];  Service: Urology;  Laterality: Left;  CYSTOSCOPY/LEFT URETEROSCOPY/HOLMIUM LASER/STENT EXCHANGE   fibroid tumor     KNEE SURGERY Bilateral    RETINAL DETACHMENT SURGERY Right 03/06/2001   TOTAL KNEE ARTHROPLASTY Right 11/08/2017   TOTAL KNEE ARTHROPLASTY Right 11/08/2017   Procedure: RIGHT TOTAL KNEE ARTHROPLASTY;  Surgeon: Cheryl Kay HERO, MD;  Location: [facility] OR;  Service: Orthopedics;  Laterality: Right;    Current Outpatient Medications  Medication Sig Dispense Refill   aspirin  EC 81 MG tablet Take 81 mg by mouth in the morning. Swallow whole.     azaTHIOprine  (IMURAN ) 50 MG tablet Take 1 tablet (50 mg total) by mouth daily. 90 tablet 4   Calcium -Magnesium -Zinc 333-133-5 MG TABS Take 1 tablet by mouth at bedtime.     carvedilol  (COREG ) 12.5 MG tablet Take 6.25 mg by mouth 2 (two) times daily with a meal.     chlorthalidone  (HYGROTON ) 25 MG tablet Take 25 mg by mouth every Monday, Wednesday, and Friday. In the morning.     Cholecalciferol  (VITAMIN D3) 125 MCG (5000 UT) TABS Take 5,000 Units by mouth in the morning.     Cyanocobalamin (VITAMIN B-12 PO) Take 2,500 mcg by mouth at bedtime.     docusate sodium  (COLACE) 100 MG capsule Take 100 mg by mouth at bedtime.     Faricimab -svoa (VABYSMO  IZ) 1 Dose by  Intravitreal route every 6 (six) weeks. Right eye     gabapentin  (NEURONTIN ) 300 MG capsule Take 300-600 mg by mouth See admin instructions. Take 1 capsule (300 mg) by mouth every morning & take 2 capsules (600 mg) by mouth every night.     melatonin 5 MG TABS Take 10 mg by mouth at bedtime.     omeprazole (PRILOSEC) 20 MG capsule Take 20 mg by mouth daily before breakfast.  8   OVER THE COUNTER MEDICATION Take 1 tablet by mouth at bedtime. Vision Gold     polyethylene glycol powder (GLYCOLAX /MIRALAX ) 17  GM/SCOOP powder Take 17 g by mouth daily as needed (constipation.).     rOPINIRole (REQUIP) 0.5 MG tablet Take 0.5 mg by mouth at bedtime.     Study - VICTORION-1 PREVENT - inclisiran 300 mg/1.48mL or placebo SQ injection ([name]) Inject 300 mg into the skin every 6 (six) months. For Investigational Use Only. Inject subcutaneously into the abdomen, upper arm or thigh every 6 months. Do not inject into areas of active skin disease,such as sun burns, skin rashes, inflammation, or skin infections.Please contact [facility] for any questions or concerns.     cephALEXin  (KEFLEX ) 250 MG capsule Take 1 capsule (250 mg total) by mouth daily. (Patient not taking: Reported on 05/14/2024) 7 capsule 0   traMADol  (ULTRAM ) 50 MG tablet Take 1 tablet (50 mg total) by mouth every 6 (six) hours as needed. (Patient not taking: Reported on 05/14/2024) 20 tablet 0   Current Facility-Administered Medications  Medication Dose Route Frequency Provider Last Rate Last Admin   Study - VICTORION-1 PREVENT - inclisiran 300 mg/1.5mL or placebo SQ injection ([name])  300 mg Subcutaneous Q6 months    300 mg at 10/10/23 9051   Study - VICTORION-1 PREVENT - inclisiran 300 mg/1.5mL or placebo SQ injection ([name])  300 mg Subcutaneous Q6 months    300 mg at 04/09/24 9144    Allergies  Allergen Reactions   Ezetimibe     Muscle pain   Rosuvastatin     Muscle pain   Prednisone  Anxiety    Couldn't sleep, anger, confusion    Social History   Socioeconomic History   Marital status: Single    Spouse name: Not on file   Number of children: 0   Years of education: Masters   Highest education level: Not on file  Occupational History   Occupation: caregiver    Comment: Part-time  Tobacco Use   Smoking status: Never   Smokeless tobacco: Never  Vaping Use   Vaping status: Never Used  Substance and Sexual Activity   Alcohol  use: No   Drug use: No   Sexual activity: Not on file  Other Topics  Concern   Not on file  Social History Narrative   Lives at home with her sister.   Right-handed.   1 cup coffee daily.   Social Drivers of Corporate investment banker Strain: Not on file  Food Insecurity: Not on file  Transportation Needs: Not on file  Physical Activity: Not on file  Stress: Not on file  Social Connections: Not on file  Intimate Partner Violence: Not on file    Family History  Problem Relation Age of Onset   Stroke Mother    Arrhythmia Mother    Bladder Cancer Mother    Thyroid  disease Mother    Heart disease Father    Heart attack Father    Stroke Maternal Grandmother    Stroke Maternal  Grandfather    Breast cancer Neg Hx    Review of Systems:  As stated in the HPI and otherwise negative.   BP 132/84   Pulse (!) 58   Ht 5' 6 (1.676 m)   Wt 153 lb (69.4 kg)   SpO2 97%   BMI 24.69 kg/m   Physical Examination: General: Well developed, well nourished, NAD  HEENT: OP clear, mucus membranes moist  SKIN: warm, dry. No rashes. Neuro: No focal deficits  Musculoskeletal: Muscle strength 5/5 all ext  Psychiatric: Mood and affect normal  Neck: No JVD, no carotid bruits, no thyromegaly, no lymphadenopathy.  Lungs:Clear bilaterally, no wheezes, rhonci, crackles Cardiovascular: Regular rate and rhythm. No murmurs, gallops or rubs. Abdomen:Soft. Bowel sounds present. Non-tender.  Extremities: No lower extremity edema. Pulses are 2 + in the bilateral DP/PT.  EKG:  EKG is ordered today. The ekg ordered today demonstrates  EKG Interpretation Date/Time:  Wednesday May 14 2024 11:00:38 EDT Ventricular Rate:  58 PR Interval:  148 QRS Duration:  76 QT Interval:  432 QTC Calculation: 424 R Axis:   90  Text Interpretation: Sinus bradycardia Biatrial enlargement Confirmed by Verlin Bruckner [phone]) on 05/14/2024 11:03:53 AM   Recent Labs: 05/05/2024: BUN 19; Creatinine, Ser 0.91; Hemoglobin 13.0; Platelets 286; Potassium 4.3; Sodium 140   Lipid  Panel No results found for: CHOL, TRIG, HDL, CHOLHDL, VLDL, LDLCALC, LDLDIRECT   Wt Readings from Last 3 Encounters:  05/14/24 153 lb (69.4 kg)  05/08/24 151 lb (68.5 kg)  05/05/24 151 lb (68.5 kg)    Assessment and Plan:   1. CAD without angina: Moderate disease in the LAD and Circumflex by coronary CTA in 2023. No chest pain. Continue ASA and beta blocker. She is in a trial and cannot take a statin due to the study.   2. HTN: BP is controlled. Continue Coreg  and chlorthalidone   3. Leg pain: Will arrange LE arterial dopplers/ABI to exclude PAD  Labs/ tests ordered today include:  Orders Placed This Encounter  Procedures   Amb ref to Medical Nutrition Therapy-MNT   EKG 12-Lead   VAS US  LOWER EXTREMITY ARTERIAL DUPLEX   VAS US  ABI WITH/WO TBI   Disposition:   F/U with me in one year   Signed, Bruckner Verlin, MD, [facility] 05/14/2024 11:55 AM    [facility] [address] [place], [place], [place]  [address] Phone: [phone]; Fax: [phone]

## 2024-05-19 ENCOUNTER — Other Ambulatory Visit (INDEPENDENT_AMBULATORY_CARE_PROVIDER_SITE_OTHER): Payer: Self-pay

## 2024-05-19 DIAGNOSIS — R7301 Impaired fasting glucose: Secondary | ICD-10-CM | POA: Diagnosis not present

## 2024-05-19 DIAGNOSIS — G8929 Other chronic pain: Secondary | ICD-10-CM | POA: Diagnosis not present

## 2024-05-19 DIAGNOSIS — M79604 Pain in right leg: Secondary | ICD-10-CM | POA: Diagnosis not present

## 2024-05-19 DIAGNOSIS — M79605 Pain in left leg: Secondary | ICD-10-CM | POA: Diagnosis not present

## 2024-05-19 DIAGNOSIS — D649 Anemia, unspecified: Secondary | ICD-10-CM | POA: Diagnosis not present

## 2024-05-19 DIAGNOSIS — R202 Paresthesia of skin: Secondary | ICD-10-CM | POA: Diagnosis not present

## 2024-05-19 DIAGNOSIS — I10 Essential (primary) hypertension: Secondary | ICD-10-CM | POA: Diagnosis not present

## 2024-05-19 DIAGNOSIS — M5440 Lumbago with sciatica, unspecified side: Secondary | ICD-10-CM | POA: Diagnosis not present

## 2024-05-19 DIAGNOSIS — E785 Hyperlipidemia, unspecified: Secondary | ICD-10-CM | POA: Diagnosis not present

## 2024-05-19 DIAGNOSIS — Z0289 Encounter for other administrative examinations: Secondary | ICD-10-CM

## 2024-05-19 DIAGNOSIS — M8580 Other specified disorders of bone density and structure, unspecified site: Secondary | ICD-10-CM | POA: Diagnosis not present

## 2024-05-19 DIAGNOSIS — Z0189 Encounter for other specified special examinations: Secondary | ICD-10-CM | POA: Diagnosis not present

## 2024-05-19 NOTE — Addendum Note (Signed)
 Addended by: ONEITA HOIST E on: 05/19/2024 09:00 AM   Modules accepted: Orders

## 2024-05-19 NOTE — Research (Addendum)
 [name]

## 2024-05-19 NOTE — Progress Notes (Unsigned)
 SABRA

## 2024-05-20 DIAGNOSIS — H3321 Serous retinal detachment, right eye: Secondary | ICD-10-CM | POA: Diagnosis not present

## 2024-05-20 DIAGNOSIS — H353211 Exudative age-related macular degeneration, right eye, with active choroidal neovascularization: Secondary | ICD-10-CM | POA: Diagnosis not present

## 2024-05-20 DIAGNOSIS — H35063 Retinal vasculitis, bilateral: Secondary | ICD-10-CM | POA: Diagnosis not present

## 2024-05-20 DIAGNOSIS — H35033 Hypertensive retinopathy, bilateral: Secondary | ICD-10-CM | POA: Diagnosis not present

## 2024-05-21 DIAGNOSIS — M48062 Spinal stenosis, lumbar region with neurogenic claudication: Secondary | ICD-10-CM | POA: Diagnosis not present

## 2024-05-21 DIAGNOSIS — M5416 Radiculopathy, lumbar region: Secondary | ICD-10-CM | POA: Diagnosis not present

## 2024-05-22 ENCOUNTER — Ambulatory Visit: Payer: Self-pay | Admitting: Neurology

## 2024-05-22 DIAGNOSIS — N200 Calculus of kidney: Secondary | ICD-10-CM | POA: Diagnosis not present

## 2024-05-22 DIAGNOSIS — R8271 Bacteriuria: Secondary | ICD-10-CM | POA: Diagnosis not present

## 2024-05-22 LAB — AUTOIMMUNE NEUROLOGY AB

## 2024-05-22 LAB — ATN PROFILE
A -- Beta-amyloid 42/40 Ratio: 0.121 (ref >0.102)
Beta-amyloid 40: 180.99 pg/mL
Beta-amyloid 42: 21.95 pg/mL
N -- NfL, Plasma: 3.87 pg/mL (ref 0.00–6.04)
T -- p-tau181: 0.77 pg/mL (ref 0.00–0.97)

## 2024-05-22 LAB — CK: Total CK: 49 U/L (ref 32–182)

## 2024-05-22 LAB — ANA W/REFLEX IF POSITIVE: Anti Nuclear Antibody (ANA): NEGATIVE

## 2024-05-27 DIAGNOSIS — Q079 Congenital malformation of nervous system, unspecified: Secondary | ICD-10-CM | POA: Diagnosis not present

## 2024-05-27 DIAGNOSIS — I739 Peripheral vascular disease, unspecified: Secondary | ICD-10-CM | POA: Diagnosis not present

## 2024-05-27 DIAGNOSIS — E785 Hyperlipidemia, unspecified: Secondary | ICD-10-CM | POA: Diagnosis not present

## 2024-05-27 DIAGNOSIS — R82998 Other abnormal findings in urine: Secondary | ICD-10-CM | POA: Diagnosis not present

## 2024-05-27 DIAGNOSIS — I6529 Occlusion and stenosis of unspecified carotid artery: Secondary | ICD-10-CM | POA: Diagnosis not present

## 2024-05-27 DIAGNOSIS — Z23 Encounter for immunization: Secondary | ICD-10-CM | POA: Diagnosis not present

## 2024-05-27 DIAGNOSIS — I10 Essential (primary) hypertension: Secondary | ICD-10-CM | POA: Diagnosis not present

## 2024-05-27 DIAGNOSIS — Z79899 Other long term (current) drug therapy: Secondary | ICD-10-CM | POA: Diagnosis not present

## 2024-05-27 DIAGNOSIS — E669 Obesity, unspecified: Secondary | ICD-10-CM | POA: Diagnosis not present

## 2024-05-27 DIAGNOSIS — Z1331 Encounter for screening for depression: Secondary | ICD-10-CM | POA: Diagnosis not present

## 2024-05-27 DIAGNOSIS — Z0000 Encounter for general adult medical examination without abnormal findings: Secondary | ICD-10-CM | POA: Diagnosis not present

## 2024-05-27 DIAGNOSIS — N200 Calculus of kidney: Secondary | ICD-10-CM | POA: Diagnosis not present

## 2024-05-27 DIAGNOSIS — H469 Unspecified optic neuritis: Secondary | ICD-10-CM | POA: Diagnosis not present

## 2024-06-02 ENCOUNTER — Other Ambulatory Visit: Payer: Self-pay | Admitting: Physician Assistant

## 2024-06-04 ENCOUNTER — Encounter (HOSPITAL_COMMUNITY)

## 2024-06-06 ENCOUNTER — Ambulatory Visit: Admitting: Neurology

## 2024-06-06 VITALS — BP 117/68 | HR 87 | Ht 66.0 in | Wt 153.0 lb

## 2024-06-06 DIAGNOSIS — M79605 Pain in left leg: Secondary | ICD-10-CM | POA: Diagnosis not present

## 2024-06-06 DIAGNOSIS — M5440 Lumbago with sciatica, unspecified side: Secondary | ICD-10-CM

## 2024-06-06 DIAGNOSIS — R202 Paresthesia of skin: Secondary | ICD-10-CM

## 2024-06-06 DIAGNOSIS — G8929 Other chronic pain: Secondary | ICD-10-CM

## 2024-06-06 DIAGNOSIS — R413 Other amnesia: Secondary | ICD-10-CM | POA: Diagnosis not present

## 2024-06-06 DIAGNOSIS — M79604 Pain in right leg: Secondary | ICD-10-CM

## 2024-06-06 DIAGNOSIS — G5603 Carpal tunnel syndrome, bilateral upper limbs: Secondary | ICD-10-CM

## 2024-06-06 NOTE — Procedures (Signed)
 Full Name: [name] Gender: Female MRN #: [id number] Date of Birth: 21-Dec-1955    Visit Date: 06/06/2024 09:00 Age: 68 Years Examining Physician: Onita Duos Referring Physician: Onita Duos Height: 5 feet 6 inch History: 68 years old female presenting with mild gait abnormality chronic low back pain, bilateral hands paresthesia, subjective weakness  Summary of the test:  Nerve conduction study: Left sural, superficial peroneal sensory responses was within low normal range  Left peroneal to EDB, tibial motor responses were normal.  Bilateral ulnar sensory and motor responses were normal.  Left radial sensory response were robust.  Bilateral median motor responses showed mildly prolonged peak latency with moderately decreased snap amplitude, right worse than left.  Bilateral median motor responses showed mildly prolonged distal latency, with well-preserved CMAP amplitude  Electromyography: Selected needle examination of left lower extremity muscles, left lumbosacral paraspinal; left upper extremity muscles, left cervical paraspinal and bilateral abductor pollicis brevis were performed.  There is mildly decreased recruitment pattern at bilateral abductor pollicis brevis, with well-preserved motor unit potential, there was no spontaneous activity.  Conclusion: This is an abnormal study.  There is electrodiagnostic evidence of bilateral distal median neuropathy across the wrist, consistent with moderate bilateral carpal tunnel syndromes, demyelinating in nature, no evidence of axonal loss.  There is no evidence of significant large fiber peripheral neuropathy or active lumbosacral radiculopathy.    ------------------------------- Duos Onita. M.D. Ph.D.   [facility] [address] [place], [place] [address] Tel: [phone] Fax: [phone]  Verbal informed consent was obtained from the patient, patient was informed of potential risk of  procedure, including bruising, bleeding, hematoma formation, infection, muscle weakness, muscle pain, numbness, among others.        MNC    Nerve / Sites Muscle Latency Ref. Amplitude Ref. Rel Amp Segments Distance Velocity Ref. Area    ms ms mV mV %  cm m/s m/s mVms  R Median - APB     Wrist APB 4.5 <=4.4 7.3 >=4.0 100 Wrist - APB 7   28.8     Upper arm APB 8.2  7.3  100 Upper arm - Wrist 24 64 >=49 29.7  L Median - APB     Wrist APB 4.6 <=4.4 7.0 >=4.0 100 Wrist - APB 7   31.3     Upper arm APB 8.2  7.5  107 Upper arm - Wrist 24 67 >=49 32.2  R Ulnar - ADM     Wrist ADM 3.0 <=3.3 8.4 >=6.0 100 Wrist - ADM 7   29.5     B.Elbow ADM 5.1  7.9  93.3 B.Elbow - Wrist 13 62 >=49 27.1     A.Elbow ADM 7.0  7.1  89.5 A.Elbow - B.Elbow 12 63 >=49 25.5  L Ulnar - ADM     Wrist ADM 3.3 <=3.3 8.9 >=6.0 100 Wrist - ADM 7   30.4     B.Elbow ADM 4.8  8.5  96.2 B.Elbow - Wrist 10 69 >=49 31.3     A.Elbow ADM 7.3  7.7  90.5 A.Elbow - B.Elbow 16 63 >=49 28.6  L Peroneal - EDB     Ankle EDB 5.1 <=6.5 2.7 >=2.0 100 Ankle - EDB 9   6.3     Fib head EDB 12.3  2.0  73.3 Fib head - Ankle 32 45 >=44 5.4     Pop fossa EDB 14.8  2.0  98.5 Pop fossa - Fib head 11 44 >=  44 5.6         Pop fossa - Ankle      L Tibial - AH     Ankle AH 4.2 <=5.8 6.2 >=4.0 100 Ankle - AH 9   13.7     Pop fossa AH 13.8  5.0  80.5 Pop fossa - Ankle 43 45 >=41 12.3                 SNC    Nerve / Sites Rec. Site Peak Lat Ref.  Amp Ref. Segments Distance    ms ms V V  cm  L Radial - Anatomical snuff box (Forearm)     Forearm Wrist 2.3 <=2.9 26 >=15 Forearm - Wrist 10  L Sural - Ankle (Calf)     Calf Ankle 3.9 <=4.4 13 >=6 Calf - Ankle 14  L Superficial peroneal - Ankle     Lat leg Ankle 3.5 <=4.4 4 >=6 Lat leg - Ankle 14  L Median - Orthodromic (Dig II, Mid palm)     Dig II Wrist 3.6 <=3.4 5 >=10 Dig II - Wrist 13  R Median - Orthodromic (Dig II, Mid palm)     Dig II Wrist 4.8 <=3.4 3 >=10 Dig II - Wrist 13  L Ulnar -  Orthodromic, (Dig V, Mid palm)     Dig V Wrist 2.8 <=3.1 5 >=5 Dig V - Wrist 11  R Ulnar - Orthodromic, (Dig V, Mid palm)     Dig V Wrist 2.8 <=3.1 8 >=5 Dig V - Wrist 60                   F  Wave    Nerve F Lat Ref.   ms ms  R Ulnar - ADM 26.1 <=32.0  L Ulnar - ADM 25.4 <=32.0  L Tibial - AH 50.1 <=56.0           EMG Summary Table    Spontaneous MUAP Recruitment  Muscle IA Fib PSW Fasc Other Amp Dur. Poly Pattern  R. Abductor pollicis brevis Normal None None None _______ Normal Normal Normal Reduced  L. Abductor pollicis brevis Normal None None None _______ Normal Normal Normal Reduced  L. First dorsal interosseous Normal None None None _______ Normal Normal Normal Normal  L. Biceps brachii Normal None None None _______ Normal Normal Normal Normal  L. Deltoid Normal None None None _______ Normal Normal Normal Normal  L. Triceps brachii Normal None None None _______ Normal Normal Normal Normal  L. Cervical paraspinals Normal None None None _______ Normal Normal Normal Normal  L. Tibialis anterior Normal None None None _______ Normal Normal Normal Normal  L. Tibialis posterior Normal None None None _______ Normal Normal Normal Normal  L. Peroneus longus Normal None None None _______ Normal Normal Normal Normal  L. Gastrocnemius (Medial head) Normal None None None _______ Normal Normal Normal Normal  L. Vastus lateralis Normal None None None _______ Normal Normal Normal Normal  L. Lumbar paraspinals (low) Normal None None None _______ Normal Normal Normal Normal  L. Lumbar paraspinals (mid) Normal None None None _______ Normal Normal Normal Normal

## 2024-06-06 NOTE — Progress Notes (Signed)
 Chief Complaint  Patient presents with   NCS    Rm 3 alone EMG Pt is well       ASSESSMENT AND PLAN  [name] is a 68 y.o. female   Known history of severe L4-5 stenosis  Most recent MRI of lumbar May 2024, multilevel degenerative changes, most noticeable at L4-5 moderate to severe spinal canal stenosis, moderate right foraminal narrowing Chronic low back pain, lower extremity achy pain, weakness, also intermittent upper extremity paresthesia weakness  EMG nerve conduction study confirmed moderate bilateral carpal tunnel syndromes, demyelinating no evidence of axonal loss, no evidence of significant large fiber peripheral neuropathy or active lumbosacral radiculopathy,  I have suggested conservative treatment, back stretching moderate exercise, wrist splint,  Normal CPK   Cognitive impairment  Family history of central nervous system degenerative disorder  Laboratory evaluation including ATN profile was negative, no treatable etiology found,     DIAGNOSTIC DATA (LABS, IMAGING, TESTING) - I reviewed patient records, labs, notes, testing and imaging myself where available.   MEDICAL HISTORY:  [name] is a 68 year old female, seen in request by her primary care from [facility] Dr. Shayne, Oneil, for evaluation of lower extremity pain, weakness,    History is obtained from the patient and review of electronic medical records. I personally reviewed pertinent available imaging films in PACS.   PMHx of Restless leg syndrome Right Macular degeneration GERD HTN Chronic low back pain Right optic neuritis  Cervical decompression Lumbar decompression  Right knee replacement  I know [name] well, saw her in the past for her right optic neuritis,  She had a history of right retinal detachment in 2002, in early October 2016, she noticed blurry vision, color washing out, by July 10 2015, she realized when she close her left eye, she saw dark  clusters of gray discoloration in her right visual field,  was diagnosed with right optic neuritis,    Evaluated by [facility] ophthalmologist Dr. Paticia Fairly, confirmed her diagnosis of right optic neuritis , she also has a history of bilateral posterior vitreous detachment, hypertensive retinopathy of both eyes, she was treated with IV Solu-Medrol , followed by given prednisone  tapering dose, after that her vision has much improved, but not back to her baseline, she has developed significant side effect with long-term steroid treatment, under the guidance of Dr. Fairly, her steroid was gradually tapered off, started on methotrexate  initially, now maintained on low-dose of Imuran  50 mg daily  Worried about the possibility of underlying multiple sclerosis, she had  MRI of the brain with and without contrast October 2015 with patient, multi supratentorium lesions, few lesions are oval-shaped, oriented towards ventricle, no contrast enhancement. This certainly raises the possibility of relapsing remitting multiple sclerosis versus small vessel disease.  Lumbar puncture:WBC 1, RBC 1, total protein 44, glucose 57, 0 oligoclonal banding was negative.  MRI of the cervical in November third 2016 with patient: Severe spinal stenosis at C5-C6 with an AP diameter of 5.6 mm. There is subtle T2 hyperintense signal within the spinal cord below the point of maximal stenosis that could represent myelopathic changes associated with possible cord compression.   Mild spinal stenosis at C6-C7 that does not lead to any spinal cord changes or nerve root compression.  She will have anterior approach cervical decompression surgery in November 02 2015.      MRI of lumbar February 2017: Multilevel degenerative changes, most severe at L4-5: disc bulging and facet hypertrophy with moderate-severe spinal stenosis  and mild biforaminal narrowing. L3-4: disc bulging and facet hypertrophy with mild-moderate spinal stenosis and no foraminal  narrowing. Additional multi-level facet hypertrophy and disc bulging as above. Compared to MRI on 11/08/10, there has been some progressive worsening of degenerative spine disease and spinal stenosis.  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 Slight progression of multilevel lumbar spondylosis, worst at L4-L5, where there is moderate-to-severe spinal canal stenosis and moderate right neural foraminal narrowing, unchanged. 2. At L3-L4, mild spinal canal stenosis, unchanged. Far lateral disc displaces the exited left L3 nerve root in the extraforaminal zone, also unchanged.  She is also concerned about her memory loss, she is a retired [employer] loss adjuster  MRI of the brain in April 2025 no acute abnormality, mild to moderate small vessel disease, compared to previous study in 2019, 2016, only mild progression  UPDATE Jun 06 2024: She is the main caregiver of her sister, remain independent, and active in her daily activity,  For evaluation of her cognitive impairment, laboratory evaluation showed negative ATN profile, no treatable etiology found, MRI of the brain 2 showed mild to moderate small vessel disease,  She complains of bilateral hands pain, paresthesia difficult to make a tight grip, EMG nerve conduction study  today showed moderate bilateral carpal tunnel syndromes, no evidence of significant large fiber peripheral neuropathy or left lumbar radiculopathy.  PHYSICAL EXAM:   Vitals:   06/06/24 0811  BP: 117/68  Pulse: 87  Weight: 153 lb (69.4 kg)  Height: 5' 6 (1.676 m)   Body mass index is 24.69 kg/m.  PHYSICAL EXAMNIATION:  Gen: NAD, conversant, well nourised, well groomed       NEUROLOGICAL EXAM:  MENTAL STATUS: Speech/cognition: Awake, alert, oriented to history taking and casual conversation CRANIAL NERVES: CN II: Visual fields are full to confrontation. Pupils are round equal and briskly reactive to light. CN III, IV, VI: extraocular movement are normal. No ptosis. CN V: Facial sensation is intact to light touch CN VII: Face is symmetric with normal eye closure  CN VIII: Hearing is normal to causal conversation. CN IX, X: Phonation is normal. CN XI: Head turning and shoulder shrug are intact  MOTOR: Variable effort on examination, felt there was no significant upper or lower extremity proximal and distal weakness  REFLEXES: Reflexes are 1 and symmetric at the biceps, triceps, knees, and trace at ankles. Plantar responses are flexor.  SENSORY: Intact to light touch, pinprick and vibratory sensation are intact in fingers and toes.  COORDINATION: There is no trunk or limb dysmetria noted.  GAIT/STANCE: Push-up, cautious, fairly steady  REVIEW OF SYSTEMS:  Full 14 system review of systems performed and notable only for as above All other review of systems were negative.   ALLERGIES: Allergies  Allergen Reactions   Ezetimibe     Muscle pain  Rosuvastatin     Muscle pain   Prednisone  Anxiety    Couldn't sleep, anger, confusion    HOME MEDICATIONS: Current Outpatient Medications  Medication Sig Dispense Refill   aspirin  EC 81 MG tablet Take 81 mg by mouth in the morning. Swallow whole.     azaTHIOprine  (IMURAN ) 50 MG tablet Take 1 tablet (50 mg total) by  mouth daily. 90 tablet 4   Calcium -Magnesium -Zinc 333-133-5 MG TABS Take 1 tablet by mouth at bedtime.     carvedilol  (COREG ) 12.5 MG tablet Take 6.25 mg by mouth 2 (two) times daily with a meal.     cephALEXin  (KEFLEX ) 250 MG capsule Take 1 capsule (250 mg total) by mouth daily. (Patient not taking: Reported on 05/14/2024) 7 capsule 0   chlorthalidone  (HYGROTON ) 25 MG tablet Take 25 mg by mouth every Monday, Wednesday, and Friday. In the morning.     Cholecalciferol  (VITAMIN D3) 125 MCG (5000 UT) TABS Take 5,000 Units by mouth in the morning.     Cyanocobalamin (VITAMIN B-12 PO) Take 2,500 mcg by mouth at bedtime.     docusate sodium  (COLACE) 100 MG capsule Take 100 mg by mouth at bedtime.     Faricimab -svoa (VABYSMO  IZ) 1 Dose by Intravitreal route every 6 (six) weeks. Right eye     gabapentin  (NEURONTIN ) 300 MG capsule Take 300-600 mg by mouth See admin instructions. Take 1 capsule (300 mg) by mouth every morning & take 2 capsules (600 mg) by mouth every night.     melatonin 5 MG TABS Take 10 mg by mouth at bedtime.     omeprazole (PRILOSEC) 20 MG capsule Take 20 mg by mouth daily before breakfast.  8   OVER THE COUNTER MEDICATION Take 1 tablet by mouth at bedtime. Vision Gold     polyethylene glycol powder (GLYCOLAX /MIRALAX ) 17 GM/SCOOP powder Take 17 g by mouth daily as needed (constipation.).     rOPINIRole (REQUIP) 0.5 MG tablet Take 0.5 mg by mouth at bedtime.     Study - VICTORION-1 PREVENT - inclisiran 300 mg/1.80mL or placebo SQ injection (PI-Stuckey) Inject 300 mg into the skin every 6 (six) months. For Investigational Use Only. Inject subcutaneously into the abdomen, upper arm or thigh every 6 months. Do not inject into areas of active skin disease,such as sun burns, skin rashes, inflammation, or skin infections.Please contact [facility] for any questions or concerns.     traMADol  (ULTRAM ) 50 MG tablet Take 1 tablet (50 mg total) by mouth every 6 (six) hours as needed.  (Patient not taking: Reported on 05/14/2024) 20 tablet 0   Current Facility-Administered Medications  Medication Dose Route Frequency Provider Last Rate Last Admin   Study - VICTORION-1 PREVENT - inclisiran 300 mg/1.5mL or placebo SQ injection (PI-Stuckey)  300 mg Subcutaneous Q6 months    300 mg at 10/10/23 9051   Study - VICTORION-1 PREVENT - inclisiran 300 mg/1.5mL or placebo SQ injection (PI-Stuckey)  300 mg Subcutaneous Q6 months    300 mg at 04/09/24 0855    PAST MEDICAL HISTORY: Past Medical History:  Diagnosis Date   Anxiety    Arthritis    Deviated septum    Dyspnea    with exertion   wheezing at night   Fibroid tumor    GERD (gastroesophageal reflux disease) 11/02/2015   Hepatitis    AS CHILD     History of kidney stones    Hypercholesteremia 07/21/2015   Hypertension 07/10/2015   Memory loss  Meniscus tear    Pneumonia    PONV (postoperative nausea and vomiting)    Restless leg syndrome 09/01/2015   Retinal detachment 03/06/2001   Spinal stenosis    Urinary frequency     PAST SURGICAL HISTORY: Past Surgical History:  Procedure Laterality Date   ANTERIOR CERVICAL DECOMP/DISCECTOMY FUSION N/A 11/02/2015   Procedure: ANTERIOR CERVICAL DECOMPRESSION FUSION CERVICAL FIVE-SIX,CERVICAL SIX-SEVEN;  Surgeon: Darina Boehringer, MD;  Location: [facility];  Service: Neurosurgery;  Laterality: N/A;  right side approach   BACK SURGERY     mild   CATARACT EXTRACTION Bilateral    CYSTOSCOPY/URETEROSCOPY/HOLMIUM LASER/STENT PLACEMENT Left 04/15/2024   Procedure: CYSTOSCOPY/URETEROSCOPY/HOLMIUM LASER/STENT PLACEMENT;  Surgeon: Elisabeth Valli BIRCH, MD;  Location: [facility];  Service: Urology;  Laterality: Left;  CYSTOSCOPY/LEFT URETEROSCOPY/HOLMIUM LASER/STENT PLACEMENT   CYSTOSCOPY/URETEROSCOPY/HOLMIUM LASER/STENT PLACEMENT Left 05/08/2024   Procedure: CYSTOSCOPY/URETEROSCOPY/HOLMIUM LASER/STENT PLACEMENT;  Surgeon: Elisabeth Valli BIRCH, MD;  Location: [facility];  Service: Urology;   Laterality: Left;  CYSTOSCOPY/LEFT URETEROSCOPY/HOLMIUM LASER/STENT EXCHANGE   fibroid tumor     KNEE SURGERY Bilateral    RETINAL DETACHMENT SURGERY Right 03/06/2001   TOTAL KNEE ARTHROPLASTY Right 11/08/2017   TOTAL KNEE ARTHROPLASTY Right 11/08/2017   Procedure: RIGHT TOTAL KNEE ARTHROPLASTY;  Surgeon: Jerri Kay HERO, MD;  Location: [facility] OR;  Service: Orthopedics;  Laterality: Right;    FAMILY HISTORY: Family History  Problem Relation Age of Onset   Stroke Mother    Arrhythmia Mother    Bladder Cancer Mother    Thyroid  disease Mother    Heart disease Father    Heart attack Father    Stroke Maternal Grandmother    Stroke Maternal Grandfather    Breast cancer Neg Hx     SOCIAL HISTORY: Social History   Socioeconomic History   Marital status: Single    Spouse name: Not on file   Number of children: 0   Years of education: Masters   Highest education level: Not on file  Occupational History   Occupation: caregiver    Comment: Part-time  Tobacco Use   Smoking status: Never   Smokeless tobacco: Never  Vaping Use   Vaping status: Never Used  Substance and Sexual Activity   Alcohol  use: No   Drug use: No   Sexual activity: Not on file  Other Topics Concern   Not on file  Social History Narrative   Lives at home with her sister.   Right-handed.   1 cup coffee daily.   Social Drivers of Corporate investment banker Strain: Not on file  Food Insecurity: Not on file  Transportation Needs: Not on file  Physical Activity: Not on file  Stress: Not on file  Social Connections: Not on file  Intimate Partner Violence: Not on file      Modena Callander, M.D. Ph.D.  [facility] [address], [address] [place], [place] [address] Ph: [phone] Fax: [phone]  CC:  Callander Modena, MD [address] [place],  [place] [address]  Shayne Anes, MD

## 2024-06-10 DIAGNOSIS — M48062 Spinal stenosis, lumbar region with neurogenic claudication: Secondary | ICD-10-CM | POA: Diagnosis not present

## 2024-06-13 ENCOUNTER — Encounter (HOSPITAL_COMMUNITY): Payer: Self-pay

## 2024-06-13 ENCOUNTER — Ambulatory Visit (HOSPITAL_BASED_OUTPATIENT_CLINIC_OR_DEPARTMENT_OTHER)
Admission: RE | Admit: 2024-06-13 | Discharge: 2024-06-13 | Disposition: A | Discharge status: Home / Self Care | Source: Ambulatory Visit | Attending: Cardiovascular Disease | Admitting: Cardiovascular Disease

## 2024-06-13 ENCOUNTER — Ambulatory Visit (HOSPITAL_COMMUNITY)
Admission: RE | Admit: 2024-06-13 | Discharge: 2024-06-13 | Disposition: A | Discharge status: Home / Self Care | Source: Ambulatory Visit | Attending: Cardiovascular Disease | Admitting: Cardiovascular Disease

## 2024-06-13 DIAGNOSIS — M79604 Pain in right leg: Secondary | ICD-10-CM | POA: Insufficient documentation

## 2024-06-13 DIAGNOSIS — M79605 Pain in left leg: Secondary | ICD-10-CM | POA: Diagnosis not present

## 2024-06-13 LAB — VAS US ABI WITH/WO TBI
Left ABI: 1.18
Right ABI: 1.2

## 2024-06-15 ENCOUNTER — Ambulatory Visit: Payer: Self-pay | Admitting: Cardiovascular Disease

## 2024-06-19 DIAGNOSIS — N202 Calculus of kidney with calculus of ureter: Secondary | ICD-10-CM | POA: Diagnosis not present

## 2024-07-01 DIAGNOSIS — H353212 Exudative age-related macular degeneration, right eye, with inactive choroidal neovascularization: Secondary | ICD-10-CM | POA: Diagnosis not present

## 2024-07-04 DIAGNOSIS — H524 Presbyopia: Secondary | ICD-10-CM | POA: Diagnosis not present

## 2024-07-04 DIAGNOSIS — H52223 Regular astigmatism, bilateral: Secondary | ICD-10-CM | POA: Diagnosis not present

## 2024-07-04 DIAGNOSIS — H5213 Myopia, bilateral: Secondary | ICD-10-CM | POA: Diagnosis not present

## 2024-07-31 DIAGNOSIS — R399 Unspecified symptoms and signs involving the genitourinary system: Secondary | ICD-10-CM | POA: Diagnosis not present

## 2024-07-31 DIAGNOSIS — N200 Calculus of kidney: Secondary | ICD-10-CM | POA: Diagnosis not present

## 2024-08-12 ENCOUNTER — Encounter: Payer: Self-pay | Admitting: Gastroenterology

## 2024-08-15 DIAGNOSIS — N209 Urinary calculus, unspecified: Secondary | ICD-10-CM | POA: Diagnosis not present

## 2024-08-15 DIAGNOSIS — K5730 Diverticulosis of large intestine without perforation or abscess without bleeding: Secondary | ICD-10-CM | POA: Diagnosis not present

## 2024-08-15 DIAGNOSIS — N132 Hydronephrosis with renal and ureteral calculous obstruction: Secondary | ICD-10-CM | POA: Diagnosis not present

## 2024-08-18 DIAGNOSIS — N390 Urinary tract infection, site not specified: Secondary | ICD-10-CM | POA: Diagnosis not present

## 2024-08-18 DIAGNOSIS — N209 Urinary calculus, unspecified: Secondary | ICD-10-CM | POA: Diagnosis not present

## 2024-08-19 ENCOUNTER — Other Ambulatory Visit: Payer: Self-pay | Admitting: Urology

## 2024-08-19 ENCOUNTER — Other Ambulatory Visit: Payer: Self-pay

## 2024-08-19 ENCOUNTER — Encounter (HOSPITAL_COMMUNITY): Payer: Self-pay | Admitting: Urology

## 2024-08-19 DIAGNOSIS — H353211 Exudative age-related macular degeneration, right eye, with active choroidal neovascularization: Secondary | ICD-10-CM | POA: Diagnosis not present

## 2024-08-19 NOTE — Progress Notes (Signed)
 Sent message, via epic in basket, requesting orders in epic from Careers adviser.

## 2024-08-19 NOTE — Progress Notes (Signed)
 For Anesthesia: PCP - Oneil Neth, MD last office visit note with Amalia Almarie Folks  10/29/2018 in [facility] Cardiologist - Verlin Lonni BIRCH, MD last office visit 05/14/24 in [facility] Neurologist- Onita Duos, MD last office visit 06/06/24 in [facility]  Bowel Prep reminder:  N/A  Chest x-ray - greater than 1 year EKG - 05/14/24 in [facility] Stress Test - 04/19/2016 in [facility] ECHO - 04/19/2016 in [facility] Cardiac Cath -  N/A Pacemaker/ICD device last checked:  N/A Pacemaker orders received: Device Rep notified:  Spinal Cord Stimulator:  N/A  Sleep Study - N/A CPAP -  N/A  Fasting Blood Sugar - N/A Checks Blood Sugar __N/A___ times a day Date and result of last Hgb A1c- N/A  Last dose of GLP1 agonist- N/A GLP1 instructions: Hold 7 days prior to schedule (Hold 24 hours-daily)   Last dose of SGLT-2 inhibitors- N/A SGLT-2 instructions: Hold 72 hours prior to surgery  Blood Thinner Instructions: N/A Last Dose:N/A Time last taken:N/A  Aspirin  Instructions: Yes Last Dose: 08/16/24 Time last taken: 715 AM  Activity level:  Able to exercise without chest pain and/or shortness of breath  Anesthesia review: N/A  Patient denies shortness of breath, fever, cough and chest pain at PAT appointment   Patient verbalized understanding of instructions that were reviewed over the telephone.

## 2024-08-20 ENCOUNTER — Ambulatory Visit (HOSPITAL_COMMUNITY)

## 2024-08-20 ENCOUNTER — Encounter (HOSPITAL_COMMUNITY): Payer: Self-pay | Admitting: Urology

## 2024-08-20 ENCOUNTER — Ambulatory Visit (HOSPITAL_COMMUNITY)
Admission: RE | Admit: 2024-08-20 | Discharge: 2024-08-20 | Disposition: A | Discharge status: Home / Self Care | Attending: Urology | Admitting: Urology

## 2024-08-20 ENCOUNTER — Encounter (HOSPITAL_COMMUNITY)
Admission: RE | Disposition: A | Discharge status: Home / Self Care | Payer: Self-pay | Source: Home / Self Care | Attending: Urology

## 2024-08-20 ENCOUNTER — Ambulatory Visit (HOSPITAL_COMMUNITY): Admitting: Registered Nurse

## 2024-08-20 DIAGNOSIS — I739 Peripheral vascular disease, unspecified: Secondary | ICD-10-CM | POA: Insufficient documentation

## 2024-08-20 DIAGNOSIS — I25119 Atherosclerotic heart disease of native coronary artery with unspecified angina pectoris: Secondary | ICD-10-CM | POA: Diagnosis not present

## 2024-08-20 DIAGNOSIS — K759 Inflammatory liver disease, unspecified: Secondary | ICD-10-CM

## 2024-08-20 DIAGNOSIS — N201 Calculus of ureter: Secondary | ICD-10-CM | POA: Diagnosis not present

## 2024-08-20 DIAGNOSIS — N132 Hydronephrosis with renal and ureteral calculous obstruction: Secondary | ICD-10-CM | POA: Diagnosis not present

## 2024-08-20 DIAGNOSIS — I10 Essential (primary) hypertension: Secondary | ICD-10-CM | POA: Diagnosis not present

## 2024-08-20 DIAGNOSIS — N3592 Unspecified urethral stricture, female: Secondary | ICD-10-CM | POA: Diagnosis not present

## 2024-08-20 HISTORY — PX: CYSTOSCOPY W/ RETROGRADES: SHX1426

## 2024-08-20 HISTORY — DX: Carpal tunnel syndrome, unspecified upper limb: G56.00

## 2024-08-20 HISTORY — DX: Unspecified macular degeneration: H35.30

## 2024-08-20 HISTORY — DX: Unspecified optic neuritis: H46.9

## 2024-08-20 HISTORY — DX: Dizziness and giddiness: R42

## 2024-08-20 HISTORY — DX: Anesthesia of skin: R20.0

## 2024-08-20 HISTORY — PX: CYSTOSCOPY/URETEROSCOPY/HOLMIUM LASER/STENT PLACEMENT: SHX6546

## 2024-08-20 LAB — COMPREHENSIVE METABOLIC PANEL WITH GFR
ALT: 13 U/L (ref 0–44)
AST: 25 U/L (ref 15–41)
Albumin: 4.1 g/dL (ref 3.5–5.0)
Alkaline Phosphatase: 77 U/L (ref 38–126)
Anion gap: 11 (ref 5–15)
BUN: 25 mg/dL — ABNORMAL HIGH (ref 8–23)
CO2: 27 mmol/L (ref 22–32)
Calcium: 9.8 mg/dL (ref 8.9–10.3)
Chloride: 102 mmol/L (ref 98–111)
Creatinine, Ser: 1.18 mg/dL — ABNORMAL HIGH (ref 0.44–1.00)
GFR, Estimated: 49 mL/min — ABNORMAL LOW (ref >60)
Glucose, Bld: 92 mg/dL (ref 70–99)
Potassium: 3.2 mmol/L — ABNORMAL LOW (ref 3.5–5.1)
Sodium: 140 mmol/L (ref 135–145)
Total Bilirubin: 0.5 mg/dL (ref 0.0–1.2)
Total Protein: 6.9 g/dL (ref 6.5–8.1)

## 2024-08-20 SURGERY — CYSTOSCOPY/URETEROSCOPY/HOLMIUM LASER/STENT PLACEMENT
Anesthesia: General | Laterality: Left

## 2024-08-20 MED ORDER — FENTANYL CITRATE (PF) 100 MCG/2ML IJ SOLN
INTRAMUSCULAR | Status: AC
  Filled 2024-08-20: qty 2

## 2024-08-20 MED ORDER — GENTAMICIN SULFATE 40 MG/ML IJ SOLN
5.0000 mg/kg | Freq: Once | INTRAVENOUS | Status: AC
  Administered 2024-08-20: 350 mg via INTRAVENOUS
  Filled 2024-08-20: qty 8.75

## 2024-08-20 MED ORDER — PROPOFOL 10 MG/ML IV BOLUS
INTRAVENOUS | Status: AC
  Filled 2024-08-20: qty 20

## 2024-08-20 MED ORDER — CHLORHEXIDINE GLUCONATE 0.12 % MT SOLN
15.0000 mL | Freq: Once | OROMUCOSAL | Status: AC
  Administered 2024-08-20: 15 mL via OROMUCOSAL

## 2024-08-20 MED ORDER — IOHEXOL 300 MG/ML  SOLN
INTRAMUSCULAR | Status: DC | PRN
Start: 2024-08-20 — End: 2024-08-20
  Administered 2024-08-20: 26 mL

## 2024-08-20 MED ORDER — DEXAMETHASONE SOD PHOSPHATE PF 10 MG/ML IJ SOLN
INTRAMUSCULAR | Status: DC | PRN
  Administered 2024-08-20: 10 mg via INTRAVENOUS

## 2024-08-20 MED ORDER — OXYCODONE-ACETAMINOPHEN 5-325 MG PO TABS: 1.0000 | ORAL_TABLET | Freq: Four times a day (QID) | ORAL | 0 refills | Status: DC | PRN

## 2024-08-20 MED ORDER — PROPOFOL 1000 MG/100ML IV EMUL
INTRAVENOUS | Status: AC
  Filled 2024-08-20: qty 100

## 2024-08-20 MED ORDER — LIDOCAINE 2% (20 MG/ML) 5 ML SYRINGE
INTRAMUSCULAR | Status: DC | PRN
  Administered 2024-08-20: 100 mg via INTRAVENOUS

## 2024-08-20 MED ORDER — ORAL CARE MOUTH RINSE: 15.0000 mL | Freq: Once | OROMUCOSAL | Status: AC

## 2024-08-20 MED ORDER — PROPOFOL 10 MG/ML IV BOLUS
INTRAVENOUS | Status: DC | PRN
  Administered 2024-08-20: 150 mg via INTRAVENOUS

## 2024-08-20 MED ORDER — ONDANSETRON HCL 4 MG/2ML IJ SOLN
INTRAMUSCULAR | Status: DC | PRN
  Administered 2024-08-20: 4 mg via INTRAVENOUS

## 2024-08-20 MED ORDER — LIDOCAINE HCL (PF) 2 % IJ SOLN
INTRAMUSCULAR | Status: AC
  Filled 2024-08-20: qty 5

## 2024-08-20 MED ORDER — SODIUM CHLORIDE 0.9 % IR SOLN
Status: DC | PRN
  Administered 2024-08-20: 3000 mL

## 2024-08-20 MED ORDER — PROPOFOL 500 MG/50ML IV EMUL
INTRAVENOUS | Status: DC | PRN
Start: 2024-08-20 — End: 2024-08-20
  Administered 2024-08-20: 125 ug/kg/min via INTRAVENOUS

## 2024-08-20 MED ORDER — SENNOSIDES-DOCUSATE SODIUM 8.6-50 MG PO TABS: 1.0000 | ORAL_TABLET | Freq: Two times a day (BID) | ORAL | 0 refills | Status: DC

## 2024-08-20 MED ORDER — FENTANYL CITRATE (PF) 100 MCG/2ML IJ SOLN
INTRAMUSCULAR | Status: DC | PRN
  Administered 2024-08-20: 25 ug via INTRAVENOUS
  Administered 2024-08-20: 50 ug via INTRAVENOUS
  Administered 2024-08-20: 25 ug via INTRAVENOUS

## 2024-08-20 MED ORDER — LACTATED RINGERS IV SOLN: INTRAVENOUS | Status: DC

## 2024-08-20 MED ORDER — ONDANSETRON HCL 4 MG/2ML IJ SOLN
INTRAMUSCULAR | Status: AC
  Filled 2024-08-20: qty 2

## 2024-08-20 SURGICAL SUPPLY — 18 items
BAG URO CATCHER STRL LF (MISCELLANEOUS) ×1 IMPLANT
BASKET LASER NITINOL 1.9FR (BASKET) IMPLANT
CATH URETL OPEN END 6FR 70 (CATHETERS) ×1 IMPLANT
CLOTH BEACON ORANGE TIMEOUT ST (SAFETY) ×1 IMPLANT
GLOVE SURG LX STRL 7.5 STRW (GLOVE) ×1 IMPLANT
GOWN STRL REUS W/ TWL XL LVL3 (GOWN DISPOSABLE) ×1 IMPLANT
GUIDEWIRE ANG ZIPWIRE 038X150 (WIRE) ×1 IMPLANT
GUIDEWIRE STR DUAL SENSOR (WIRE) ×1 IMPLANT
KIT TURNOVER KIT A (KITS) ×1 IMPLANT
MANIFOLD NEPTUNE II (INSTRUMENTS) ×1 IMPLANT
NS IRRIG 1000ML POUR BTL (IV SOLUTION) IMPLANT
PACK CYSTO (CUSTOM PROCEDURE TRAY) ×1 IMPLANT
SHEATH NAVIGATOR HD 11/13X28 (SHEATH) IMPLANT
SHEATH NAVIGATOR HD 11/13X36 (SHEATH) IMPLANT
TRACTIP FLEXIVA PULS ID 200XHI (Laser) IMPLANT
TUBE PU 8FR 16IN ENFIT (TUBING) ×1 IMPLANT
TUBING CONNECTING 10 (TUBING) ×1 IMPLANT
TUBING UROLOGY SET (TUBING) ×1 IMPLANT

## 2024-08-20 NOTE — Anesthesia Procedure Notes (Signed)
 Procedure Name: LMA Insertion Date/Time: 08/20/2024 2:16 PM  Performed by: Therisa Doyal CROME, CRNAPatient Re-evaluated:Patient Re-evaluated prior to induction Oxygen Delivery Method: Circle system utilized Preoxygenation: Pre-oxygenation with 100% oxygen Induction Type: IV induction LMA: LMA inserted LMA Size: 4.0 Number of attempts: 1 Placement Confirmation: positive ETCO2 and breath sounds checked- equal and bilateral Tube secured with: Tape Dental Injury: Teeth and Oropharynx as per pre-operative assessment

## 2024-08-20 NOTE — Discharge Instructions (Signed)
1 - You may have urinary urgency (bladder spasms) and bloody urine on / off for up to a week. This is normal.  2 - Call MD or go to ER for fever >102, severe pain / nausea / vomiting not relieved by medications, or acute change in medical status

## 2024-08-20 NOTE — Anesthesia Postprocedure Evaluation (Signed)
 Anesthesia Post Note  Patient: [name]  Procedure(s) Performed: CYSTOSCOPY/DIAGNOSTIC URETEROSCOPY/HOLMIUM LASER (Left) CYSTOSCOPY, WITH RETROGRADE PYELOGRAM (Left)     Patient location during evaluation: PACU Anesthesia Type: General Level of consciousness: awake and alert and oriented Pain management: pain level controlled Vital Signs Assessment: post-procedure vital signs reviewed and stable Respiratory status: spontaneous breathing, nonlabored ventilation and respiratory function stable Cardiovascular status: blood pressure returned to baseline and stable Postop Assessment: no apparent nausea or vomiting Anesthetic complications: no   No notable events documented.  Last Vitals:  Vitals:   08/20/24 1051 08/20/24 1526  BP: (!) 153/80   Pulse: 75   Resp: 16   Temp: 37.1 C 36.4 C  SpO2: 97%     Last Pain:  Vitals:   08/20/24 1526  TempSrc: Oral  PainSc:                  Hollye Pritt A.

## 2024-08-20 NOTE — Anesthesia Preprocedure Evaluation (Addendum)
 Anesthesia Evaluation  Patient identified by MRN, date of birth, ID band Patient awake    Reviewed: Allergy & Precautions, NPO status , Patient's Chart, lab work & pertinent test results, reviewed documented beta blocker date and time   History of Anesthesia Complications (+) PONV and history of anesthetic complications  Airway Mallampati: I  TM Distance: >3 FB     Dental  (+) Teeth Intact, Dental Advisory Given, Caps   Pulmonary shortness of breath and with exertion, sleep apnea and Continuous Positive Airway Pressure Ventilation , pneumonia, resolved   Pulmonary exam normal breath sounds clear to auscultation       Cardiovascular hypertension, Pt. on medications + angina with exertion + CAD and + Peripheral Vascular Disease  Normal cardiovascular exam Rhythm:Regular Rate:Normal     Neuro/Psych   Anxiety      Neuromuscular disease    GI/Hepatic ,GERD  Medicated,,(+) Hepatitis -, A  Endo/Other    Renal/GU Renal diseaseLeft ureteral calculus Lab Results      Component                Value               Date                      NA                       140                 05/05/2024                CL                       102                 05/05/2024                K                        4.3                 05/05/2024                CO2                      27                  05/05/2024                BUN                      19                  05/05/2024                CREATININE               0.91                05/05/2024                GFRNONAA                 >60                 05/05/2024  CALCIUM                   9.8                 05/05/2024                PHOS                     3.2                 09/25/2017                ALBUMIN                  3.8                 10/29/2017                GLUCOSE                  109 (H)             05/05/2024             negative genitourinary    Musculoskeletal  (+) Arthritis , Osteoarthritis,    Abdominal   Peds  Hematology  (+) Blood dyscrasia, anemia Lab Results      Component                Value               Date                      WBC                      6.9                 05/05/2024                HGB                      13.0                05/05/2024                HCT                      40.0                05/05/2024                MCV                      99.0                05/05/2024                PLT                      286                 05/05/2024              Anesthesia Other Findings   Reproductive/Obstetrics                              Anesthesia Physical Anesthesia Plan  ASA: 2  Anesthesia  Plan: General   Post-op Pain Management: Dilaudid  IV, Ofirmev  IV (intra-op)* and Precedex   Induction: Intravenous  PONV Risk Score and Plan: 4 or greater and Treatment may vary due to age or medical condition, Propofol  infusion, Ondansetron , Dexamethasone  and TIVA  Airway Management Planned: LMA  Additional Equipment: None  Intra-op Plan:   Post-operative Plan: Extubation in OR  Informed Consent: I have reviewed the patients History and Physical, chart, labs and discussed the procedure including the risks, benefits and alternatives for the proposed anesthesia with the patient or authorized representative who has indicated his/her understanding and acceptance.     Dental advisory given  Plan Discussed with: CRNA and Anesthesiologist  Anesthesia Plan Comments:          Anesthesia Quick Evaluation

## 2024-08-20 NOTE — Op Note (Signed)
 [name], [name] MEDICAL RECORD NO: [id number] ACCOUNT NO: [id number] DATE OF BIRTH: 1956-08-02 FACILITY: [facility] LOCATION: [facility] PHYSICIAN: Ricardo Likens, MD  Operative Report   SURGEON:  Ricardo Likens, MD.  PREOPERATIVE DIAGNOSIS:  Left ureteral stone.  POSTOPERATIVE DIAGNOSIS:  High grade left mid ureteral stricture.  PROCEDURES PERFORMED: 1.  Cystoscopy with left retrograde pyelogram, interpretation. 2.  Left ureteroscopy with laser lithotripsy.  ESTIMATED BLOOD LOSS:  Nil.  COMPLICATIONS:  None.  SPECIMENS:  None.  FINDINGS: 1.  Very high grade left mid ureteral stricture, essentially blind ending. 2.  Unsuccessful attempt at ureteral incision with stricture.  INDICATIONS:  The patient is a pleasant unfortunately 68 year old lady with a history of a large left UPJ stone, status post staged ureteroscopy several more months ago.  It was noted at some point to have a small ureteral perforation with some  extraluminal stone per records, managed with prolonged stenting.  She has represented recently with some new left hydronephrosis due to what appears to be a left mid ureteral stone.  Options discussed for management including ____ attempting  ureteroscopy.  She presents for this today.  Informed consent was obtained and placed in the medical record.  DESCRIPTION OF PROCEDURE:  The patient being verified and procedure being cysto and left ureteroscopic stone manipulation was confirmed.  Procedure timeout was performed.  Intravenous antibiotics administered.  General anesthesia was introduced.  The  patient was placed into a low lithotomy position.  A sterile field was created.  Prepping and draping the patient's vagina, introitus, and proximal thighs using iodine.  Cystourethroscopy performed using a 21-French rigid cystoscope with offset lens.   Inspection of the urinary bladder was unremarkable.  No diverticula or calcifications noted.  The left ureteral orifice was  cannulated with a 6-French end-hole catheter and a left retrograde pyelogram was obtained.  Left retrograde pyelogram demonstrated single left ureter but no retrograde filling beyond the level of the mid ureter, consistent with possible significant stone impaction versus stricture.  A 0.038 ZIPwire was advanced and multiple angulations were  unable to traverse this area radiographically.  It was therefore left in place at this area.  An 8-French feeding tube was placed in the urinary bladder for pressure release.  A semi-rigid ureteroscopy was performed in the distal left ureter alongside as  a separate sensory working wire.  In the area of the mid ureter, there appeared to be a very very high grade stricture at the area of prior non-traversing contrast.  There did appear to be a punctate area in the middle of stone material suggesting that  likely this stricture was intimately associated with an impacted stone and given the visualization of this, I did feel that an attempt at laser lithotripsy was warranted in an effort to try to fracture the stone enough to regain ureteral continuity  and/or incise the stricture in this short segment to regain ureteral continuity.  As such, holmium laser energy was applied to the area using a setting of 0.2 joules and 20 hertz with a 240-nanometer fiber and very careful laser energy was applied  directly to the center point of the ureter in the area of what appeared to be stricture and impacted stone.  I was able to incise this area and traverse an additional approximately 4 to 5 millimeters.  However, we did not visualize reliably ureteral  mucosa at this point and this appeared to be completely extraluminal visualization.  Additional retrograde pyelogram was then performed to see if any  contrast passed proximally to this and there was not.  I felt this likely represented an incredibly high  grade and somewhat longer segment of stricture associated with stone and this  was not going to be amenable to endoscopic management whatsoever.  The safest means of management would be to abandon the procedure today and then discuss options with the  patient including likely recommended ureteroureterostomy in an elective setting with the goal of excising the stricture and associated stone.  As such, the access sheath was removed under direct continuous vision.  No significant mucosal abnormalities  were found.  The bladder was drained via cystoscope.  The procedure was terminated.  The patient tolerated the procedure well.  No immediate periprocedural complications.  The patient was taken to the postanesthesia care unit in stable condition.  Plan  for discharge home.   NIK D: 08/20/2024 2:53:24 pm T: 08/20/2024 8:48:00 pm  JOB: [id number]/ [id number]

## 2024-08-20 NOTE — Brief Op Note (Signed)
 08/20/2024  2:46 PM  PATIENT:  [name]  68 y.o. female  PRE-OPERATIVE DIAGNOSIS:  LEFT URETERAL STONE  POST-OPERATIVE DIAGNOSIS:  LEFT URETERAL STONE + HIIGH GRADE STRICTURE  PROCEDURE:  Procedure(s): CYSTOSCOPY/DIAGNOSTIC URETEROSCOPY/HOLMIUM LASER (Left) CYSTOSCOPY, WITH RETROGRADE PYELOGRAM (Left)  SURGEON:  Surgeons and Role:    * Manny, Ricardo KATHEE Raddle., MD - Primary  PHYSICIAN ASSISTANT:   ASSISTANTS: none   ANESTHESIA:   general  EBL:  minimal   BLOOD ADMINISTERED:none  DRAINS: none   LOCAL MEDICATIONS USED:  NONE  SPECIMEN:  No Specimen  DISPOSITION OF SPECIMEN:  N/A  COUNTS:  YES  TOURNIQUET:  * No tourniquets in log *  DICTATION: .Other Dictation: Dictation Number [id number]  PLAN OF CARE: Discharge to home after PACU  PATIENT DISPOSITION:  PACU - hemodynamically stable.   Delay start of Pharmacological VTE agent (>24hrs) due to surgical blood loss or risk of bleeding: not applicable

## 2024-08-20 NOTE — Transfer of Care (Signed)
 Immediate Anesthesia Transfer of Care Note  Patient: [name]  Procedure(s) Performed: CYSTOSCOPY/DIAGNOSTIC URETEROSCOPY/HOLMIUM LASER (Left) CYSTOSCOPY, WITH RETROGRADE PYELOGRAM (Left)  Patient Location: PACU  Anesthesia Type:General  Level of Consciousness: awake, alert , and oriented  Airway & Oxygen Therapy: Patient Spontanous Breathing and Patient connected to face mask oxygen  Post-op Assessment: Report given to RN and Post -op Vital signs reviewed and stable  Post vital signs: Reviewed and stable  Last Vitals:  Vitals Value Taken Time  BP 149/78 08/20/24 15:16  Temp    Pulse 70 08/20/24 15:21  Resp 25 08/20/24 15:21  SpO2 100 % 08/20/24 15:21  Vitals shown include unfiled device data.  Last Pain:  Vitals:   08/20/24 1149  TempSrc:   PainSc: 0-No pain      Patients Stated Pain Goal: 3 (08/19/24 1308)  Complications: No notable events documented.

## 2024-08-20 NOTE — H&P (Signed)
 [name] is an 68 y.o. female.    Chief Complaint: Pre-OP LEFT Ureteroscopic Stone Manipulation  HPI:   1 - LEFT Ureteral Stone - 6mm left proximal stone and some lower pole likely stone dust on CT 07/2024 on eval flank pain. S/p 2 stage ureteroscopy for very large UPJ stone around 04/2024 (declined PCNL).  Today [name] is seen to proceed with LEFT ureteroscopy with goal of left side stone free for proximal stone. Normally follows with Dr. Elisabeth who is off. Most recent UA without infectious parameters (11/24).    Past Medical History:  Diagnosis Date   Anxiety    Arthritis    Carpal tunnel syndrome    Right hand   Deviated septum    Dyspnea    with exertion   wheezing at night   Fibroid tumor    GERD (gastroesophageal reflux disease) 11/02/2015   Hepatitis    AS CHILD  , 4th grade   History of kidney stones    Hypercholesteremia 07/21/2015   Hypertension 07/10/2015   Macular degeneration    Right   Memory loss    Meniscus tear    Numbness in feet    Optic neuritis    Right   Pneumonia    Walking   PONV (postoperative nausea and vomiting)    Restless leg syndrome 09/01/2015   Retinal detachment 03/06/2001   Spinal stenosis    Urinary frequency    Vertigo     Past Surgical History:  Procedure Laterality Date   ANTERIOR CERVICAL DECOMP/DISCECTOMY FUSION N/A 11/02/2015   Procedure: ANTERIOR CERVICAL DECOMPRESSION FUSION CERVICAL FIVE-SIX,CERVICAL SIX-SEVEN;  Surgeon: Darina Boehringer, MD;  Location: [facility];  Service: Neurosurgery;  Laterality: N/A;  right side approach   BACK SURGERY     mild   CATARACT EXTRACTION Bilateral    CYSTOSCOPY/URETEROSCOPY/HOLMIUM LASER/STENT PLACEMENT Left 04/15/2024   Procedure: CYSTOSCOPY/URETEROSCOPY/HOLMIUM LASER/STENT PLACEMENT;  Surgeon: Elisabeth Valli BIRCH, MD;  Location: [facility];  Service: Urology;  Laterality: Left;  CYSTOSCOPY/LEFT URETEROSCOPY/HOLMIUM LASER/STENT PLACEMENT   CYSTOSCOPY/URETEROSCOPY/HOLMIUM LASER/STENT  PLACEMENT Left 05/08/2024   Procedure: CYSTOSCOPY/URETEROSCOPY/HOLMIUM LASER/STENT PLACEMENT;  Surgeon: Elisabeth Valli BIRCH, MD;  Location: [facility];  Service: Urology;  Laterality: Left;  CYSTOSCOPY/LEFT URETEROSCOPY/HOLMIUM LASER/STENT EXCHANGE   fibroid tumor     KNEE SURGERY Bilateral    RETINAL DETACHMENT SURGERY Right 03/06/2001   TOTAL KNEE ARTHROPLASTY Right 11/08/2017   Procedure: RIGHT TOTAL KNEE ARTHROPLASTY;  Surgeon: Jerri Kay HERO, MD;  Location: [facility];  Service: Orthopedics;  Laterality: Right;    Family History  Problem Relation Age of Onset   Stroke Mother    Arrhythmia Mother    Bladder Cancer Mother    Thyroid  disease Mother    Heart disease Father    Heart attack Father    Stroke Maternal Grandmother    Stroke Maternal Grandfather    Breast cancer Neg Hx    Social History:  reports that she has never smoked. She has never used smokeless tobacco. She reports that she does not drink alcohol  and does not use drugs.  Allergies:  Allergies  Allergen Reactions   Ezetimibe Other (See Comments)    Myalgia   Prednisone  Anxiety and Other (See Comments)    Sleep disturbances, Agitation, Confusion   Rosuvastatin Other (See Comments)    Myalgia     No medications prior to admission.    No results found for this or any previous visit (from the past 48 hours). No results found.  Review of Systems  Constitutional:  Negative for chills and fever.  Genitourinary:  Positive for flank pain.  All other systems reviewed and are negative.   Height 5' 6 (1.676 m), weight 68 kg. Physical Exam Vitals reviewed.  HENT:     Head: Normocephalic.  Eyes:     Pupils: Pupils are equal, round, and reactive to light.  Cardiovascular:     Pulses: Normal pulses.  Abdominal:     General: Abdomen is flat.  Genitourinary:    Comments: Mild left CVAT at present Musculoskeletal:        General: Normal range of motion.     Cervical back: Normal range of motion.  Neurological:      Mental Status: She is alert.  Psychiatric:        Mood and Affect: Mood normal.      Assessment/Plan  Proceed as planned with LEFT ureteroscopic stone manipulation. Risks, benefits, alternatives, expected peri-op course discussed in detail including possibility of post-op stent pending intra-op findings.   Ricardo KATHEE Alvaro Mickey., MD 08/20/2024, 8:55 AM

## 2024-08-21 ENCOUNTER — Encounter (HOSPITAL_COMMUNITY): Payer: Self-pay | Admitting: Urology

## 2024-08-28 DIAGNOSIS — N131 Hydronephrosis with ureteral stricture, not elsewhere classified: Secondary | ICD-10-CM | POA: Diagnosis not present

## 2024-09-08 ENCOUNTER — Other Ambulatory Visit: Payer: Self-pay | Admitting: Urology

## 2024-09-30 NOTE — Progress Notes (Unsigned)
 "  Chief Complaint: Primary GI MD: Unassigned  HPI: 69 year old female history of hypertension, hyperlipidemia, anxiety, CAD, presents for evaluation of colon cancer screening and unintentional weight loss  Referred by PCP for 20 pound unintentional weight loss   Discussed the use of AI scribe software for clinical note transcription with the patient, who gave verbal consent to proceed.  History of Present Illness      PREVIOUS GI WORKUP     Past Medical History:  Diagnosis Date   Anxiety    Arthritis    Carpal tunnel syndrome    Right hand   Deviated septum    Dyspnea    with exertion   wheezing at night   Fibroid tumor    GERD (gastroesophageal reflux disease) 11/02/2015   Hepatitis    AS CHILD  , 4th grade   History of kidney stones    Hypercholesteremia 07/21/2015   Hypertension 07/10/2015   Macular degeneration    Right   Memory loss    Meniscus tear    Numbness in feet    Optic neuritis    Right   Pneumonia    Walking   PONV (postoperative nausea and vomiting)    Restless leg syndrome 09/01/2015   Retinal detachment 03/06/2001   Spinal stenosis    Urinary frequency    Vertigo     Past Surgical History:  Procedure Laterality Date   ANTERIOR CERVICAL DECOMP/DISCECTOMY FUSION N/A 11/02/2015   Procedure: ANTERIOR CERVICAL DECOMPRESSION FUSION CERVICAL FIVE-SIX,CERVICAL SIX-SEVEN;  Surgeon: Darina Boehringer, MD;  Location: [facility];  Service: Neurosurgery;  Laterality: N/A;  right side approach   BACK SURGERY     mild   CATARACT EXTRACTION Bilateral    CYSTOSCOPY W/ RETROGRADES Left 08/20/2024   Procedure: CYSTOSCOPY, WITH RETROGRADE PYELOGRAM;  Surgeon: Alvaro Ricardo KATHEE Mickey., MD;  Location: [facility];  Service: Urology;  Laterality: Left;   CYSTOSCOPY/URETEROSCOPY/HOLMIUM LASER/STENT PLACEMENT Left 04/15/2024   Procedure: CYSTOSCOPY/URETEROSCOPY/HOLMIUM LASER/STENT PLACEMENT;  Surgeon: Elisabeth Valli BIRCH, MD;  Location: [facility];  Service: Urology;   Laterality: Left;  CYSTOSCOPY/LEFT URETEROSCOPY/HOLMIUM LASER/STENT PLACEMENT   CYSTOSCOPY/URETEROSCOPY/HOLMIUM LASER/STENT PLACEMENT Left 05/08/2024   Procedure: CYSTOSCOPY/URETEROSCOPY/HOLMIUM LASER/STENT PLACEMENT;  Surgeon: Elisabeth Valli BIRCH, MD;  Location: [facility];  Service: Urology;  Laterality: Left;  CYSTOSCOPY/LEFT URETEROSCOPY/HOLMIUM LASER/STENT EXCHANGE   CYSTOSCOPY/URETEROSCOPY/HOLMIUM LASER/STENT PLACEMENT Left 08/20/2024   Procedure: CYSTOSCOPY/DIAGNOSTIC URETEROSCOPY/HOLMIUM LASER;  Surgeon: Alvaro Ricardo KATHEE Mickey., MD;  Location: [facility];  Service: Urology;  Laterality: Left;   fibroid tumor     KNEE SURGERY Bilateral    RETINAL DETACHMENT SURGERY Right 03/06/2001   TOTAL KNEE ARTHROPLASTY Right 11/08/2017   Procedure: RIGHT TOTAL KNEE ARTHROPLASTY;  Surgeon: Jerri Kay HERO, MD;  Location: [facility];  Service: Orthopedics;  Laterality: Right;    Current Outpatient Medications  Medication Sig Dispense Refill   aspirin  EC 81 MG tablet Take 81 mg by mouth in the morning. Swallow whole.     azaTHIOprine  (IMURAN ) 50 MG tablet Take 1 tablet (50 mg total) by mouth daily. 90 tablet 4   carvedilol  (COREG ) 12.5 MG tablet Take 6.25 mg by mouth 2 (two) times daily with a meal.     chlorthalidone  (HYGROTON ) 25 MG tablet Take 25 mg by mouth every Monday, Wednesday, and Friday. In the morning.     cholecalciferol  (VITAMIN D3) 25 MCG (1000 UNIT) tablet Take 1,000 Units by mouth in the morning.     cyanocobalamin (VITAMIN B12) 1000 MCG tablet Take 1,000 mcg by mouth at bedtime.  docusate sodium  (COLACE) 100 MG capsule Take 100 mg by mouth at bedtime.     Faricimab -svoa (VABYSMO  IZ) 1 Dose by Intravitreal route every 6 (six) weeks. Right eye     gabapentin  (NEURONTIN ) 300 MG capsule Take 300-600 mg by mouth See admin instructions. Take 1 capsule (300 mg) by mouth every morning & take 2 capsules (600 mg) by mouth every night.     melatonin 5 MG TABS Take 10 mg by mouth at bedtime.     Multiple  Vitamins-Minerals (CENTRUM SILVER WOMEN 50+) TABS Take 1 tablet by mouth daily.     omeprazole (PRILOSEC) 20 MG capsule Take 20 mg by mouth daily as needed (Heartburn).  8   OVER THE COUNTER MEDICATION Take 1 tablet by mouth at bedtime. Opti Gold     oxyCODONE -acetaminophen  (PERCOCET) 5-325 MG tablet Take 1 tablet by mouth every 6 (six) hours as needed for moderate pain (pain score 4-6) or severe pain (pain score 7-10) (post-operatively). (Patient not taking: Reported on 09/30/2024) 15 tablet 0   polyethylene glycol powder (GLYCOLAX /MIRALAX ) 17 GM/SCOOP powder Take 17 g by mouth daily as needed (constipation.).     psyllium (REGULOID) 0.52 g capsule Take 0.52 g by mouth daily.     rOPINIRole (REQUIP) 0.5 MG tablet Take 0.5 mg by mouth at bedtime.     senna-docusate (SENOKOT-S) 8.6-50 MG tablet Take 1 tablet by mouth 2 (two) times daily. While taking strong pain meds to prevent constipation (Patient not taking: Reported on 09/30/2024) 10 tablet 0   Study - VICTORION-1 PREVENT - inclisiran 300 mg/1.5mL or placebo SQ injection (PI-Stuckey) Inject 300 mg into the skin every 6 (six) months. For Investigational Use Only. Inject subcutaneously into the abdomen, upper arm or thigh every 6 months. Do not inject into areas of active skin disease,such as sun burns, skin rashes, inflammation, or skin infections.Please contact [facility] for any questions or concerns.     Current Facility-Administered Medications  Medication Dose Route Frequency Provider Last Rate Last Admin   Study - VICTORION-1 PREVENT - inclisiran 300 mg/1.5mL or placebo SQ injection (PI-Stuckey)  300 mg Subcutaneous Q6 months    300 mg at 10/10/23 9051   Study - VICTORION-1 PREVENT - inclisiran 300 mg/1.5mL or placebo SQ injection (PI-Stuckey)  300 mg Subcutaneous Q6 months    300 mg at 04/09/24 0855    Allergies as of 10/01/2024 - Review Complete 09/30/2024  Allergen Reaction Noted   Ezetimibe Other (See Comments) 10/10/2023    Prednisone  Anxiety and Other (See Comments) 04/02/2024   Rosuvastatin Other (See Comments) 10/10/2023    Family History  Problem Relation Age of Onset   Stroke Mother    Arrhythmia Mother    Bladder Cancer Mother    Thyroid  disease Mother    Heart disease Father    Heart attack Father    Stroke Maternal Grandmother    Stroke Maternal Grandfather    Breast cancer Neg Hx     Social History   Socioeconomic History   Marital status: Single    Spouse name: Not on file   Number of children: 0   Years of education: Masters   Highest education level: Not on file  Occupational History   Occupation: caregiver    Comment: Part-time  Tobacco Use   Smoking status: Never   Smokeless tobacco: Never  Vaping Use   Vaping status: Never Used  Substance and Sexual Activity   Alcohol  use: No   Drug use: No  Sexual activity: Not on file  Other Topics Concern   Not on file  Social History Narrative   Lives at home with her sister.   Right-handed.   1 cup coffee daily.   Social Drivers of Health   Tobacco Use: Low Risk (08/20/2024)   Patient History    Smoking Tobacco Use: Never    Smokeless Tobacco Use: Never    Passive Exposure: Not on file  Financial Resource Strain: Not on file  Food Insecurity: Not on file  Transportation Needs: Not on file  Physical Activity: Not on file  Stress: Not on file  Social Connections: Not on file  Intimate Partner Violence: Not on file  Depression (PHQ2-9): Not on file  Alcohol  Screen: Not on file  Housing: Not on file  Utilities: Not on file  Health Literacy: Not on file    Review of Systems:    Constitutional: No weight loss, fever, chills, weakness or fatigue HEENT: Eyes: No change in vision               Ears, Nose, Throat:  No change in hearing or congestion Skin: No rash or itching Cardiovascular: No chest pain, chest pressure or palpitations   Respiratory: No SOB or cough Gastrointestinal: See HPI and otherwise  negative Genitourinary: No dysuria or change in urinary frequency Neurological: No headache, dizziness or syncope Musculoskeletal: No new muscle or joint pain Hematologic: No bleeding or bruising Psychiatric: No history of depression or anxiety    Physical Exam:  Vital signs: There were no vitals taken for this visit.  Constitutional: NAD, alert and cooperative Head:  Normocephalic and atraumatic. Eyes:   PEERL, EOMI. No icterus. Conjunctiva pink. Respiratory: Respirations even and unlabored. Lungs clear to auscultation bilaterally.   No wheezes, crackles, or rhonchi.  Cardiovascular:  Regular rate and rhythm. No peripheral edema, cyanosis or pallor.  Gastrointestinal:  Soft, nondistended, nontender. No rebound or guarding. Normal bowel sounds. No appreciable masses or hepatomegaly. Rectal:  Declines Msk:  Symmetrical without gross deformities. Without edema, no deformity or joint abnormality.  Neurologic:  Alert and  oriented x4;  grossly normal neurologically.  Skin:   Dry and intact without significant lesions or rashes. Psychiatric: Oriented to person, place and time. Demonstrates good judgement and reason without abnormal affect or behaviors.  Physical Exam    RELEVANT LABS AND IMAGING: CBC    Component Value Date/Time   WBC 6.9 05/05/2024 0943   RBC 4.04 05/05/2024 0943   HGB 13.0 05/05/2024 0943   HGB 12.9 04/22/2021 1500   HCT 40.0 05/05/2024 0943   HCT 37.9 04/22/2021 1500   PLT 286 05/05/2024 0943   PLT 242 04/22/2021 1500   MCV 99.0 05/05/2024 0943   MCV 95 04/22/2021 1500   MCH 32.2 05/05/2024 0943   MCHC 32.5 05/05/2024 0943   RDW 13.2 05/05/2024 0943   RDW 12.8 04/22/2021 1500   LYMPHSABS 1.9 10/29/2017 1013   MONOABS 0.4 10/29/2017 1013   EOSABS 0.2 10/29/2017 1013   BASOSABS 0.0 10/29/2017 1013    CMP     Component Value Date/Time   NA 140 08/20/2024 1225   NA 141 05/14/2023 1004   K 3.2 (L) 08/20/2024 1225   CL 102 08/20/2024 1225   CO2 27  08/20/2024 1225   GLUCOSE 92 08/20/2024 1225   BUN 25 (H) 08/20/2024 1225   BUN 19 05/14/2023 1004   CREATININE 1.18 (H) 08/20/2024 1225   CALCIUM  9.8 08/20/2024 1225   PROT 6.9 08/20/2024 1225  ALBUMIN 4.1 08/20/2024 1225   AST 25 08/20/2024 1225   ALT 13 08/20/2024 1225   ALKPHOS 77 08/20/2024 1225   BILITOT 0.5 08/20/2024 1225   GFRNONAA 49 (L) 08/20/2024 1225   GFRAA >60 11/09/2017 0700     Assessment/Plan:   Unintentional weight loss Colon cancer screening CTAP with contrast 02/2024 with 2.4 cm stone in left renal pelvis with mild hydronephrosis, otherwise unrevealing imaging study.  Negative Cologuard 03/2023  CAD Follows with Dr. Verlin.  On aspirin  and beta-blocker.  Moderate disease in LAD and circumflex by coronary CTA 2023    Nestor Blower, PA-C [facility] 09/30/2024, 10:04 PM  Cc: Shayne Anes, MD "

## 2024-10-01 ENCOUNTER — Encounter: Payer: Self-pay | Admitting: Gastroenterology

## 2024-10-01 ENCOUNTER — Ambulatory Visit: Admitting: Gastroenterology

## 2024-10-01 VITALS — BP 140/84 | HR 69 | Ht 66.0 in | Wt 153.2 lb

## 2024-10-01 DIAGNOSIS — K5900 Constipation, unspecified: Secondary | ICD-10-CM

## 2024-10-01 DIAGNOSIS — R634 Abnormal weight loss: Secondary | ICD-10-CM

## 2024-10-01 DIAGNOSIS — N200 Calculus of kidney: Secondary | ICD-10-CM

## 2024-10-01 DIAGNOSIS — R194 Change in bowel habit: Secondary | ICD-10-CM

## 2024-10-01 MED ORDER — NA SULFATE-K SULFATE-MG SULF 17.5-3.13-1.6 GM/177ML PO SOLN: 1.0000 | Freq: Once | ORAL | 0 refills | Status: AC

## 2024-10-01 NOTE — Patient Instructions (Addendum)
 Start taking Miralax  1 capful (17 grams) 1x / day for 1 week.   If this is not effective, increase to 1 dose 2x / day for 1 week.   If this is still not effective, increase to two capfuls (34 grams) 2x / day.   Can adjust dose as needed based on response. Can take 1/2 cap daily, skip days, or increase per day.    You have been scheduled for a colonoscopy. Please follow written instructions given to you at your visit today.   If you use inhalers (even only as needed), please bring them with you on the day of your procedure.  DO NOT TAKE 7 DAYS PRIOR TO TEST- Trulicity (dulaglutide) Ozempic, Wegovy (semaglutide) Mounjaro, Zepbound (tirzepatide) Bydureon Bcise (exanatide extended release)  DO NOT TAKE 1 DAY PRIOR TO YOUR TEST Rybelsus (semaglutide) Adlyxin (lixisenatide) Victoza (liraglutide) Byetta (exanatide) ___________________________________________________________________________  _______________________________________________________  If your blood pressure at your visit was 140/90 or greater, please contact your primary care physician to follow up on this.  _______________________________________________________  If you are age 53 or older, your body mass index should be between 23-30. Your Body mass index is 24.74 kg/m. If this is out of the aforementioned range listed, please consider follow up with your Primary Care Provider.  If you are age 29 or younger, your body mass index should be between 19-25. Your Body mass index is 24.74 kg/m. If this is out of the aformentioned range listed, please consider follow up with your Primary Care Provider.   ________________________________________________________  The [facility] providers would like to encourage you to use MYCHART to communicate with providers for non-urgent requests or questions.  Due to long hold times on the telephone, sending your provider a message by [facility] may be a faster and more efficient way to get a  response.  Please allow 48 business hours for a response.  Please remember that this is for non-urgent requests.  _______________________________________________________  [facility] is using a team-based approach to care.  Your team is made up of your doctor and two to three APPS. Our APPS (Nurse Practitioners and Physician Assistants) work with your physician to ensure care continuity for you. They are fully qualified to address your health concerns and develop a treatment plan. They communicate directly with your gastroenterologist to care for you. Seeing the Advanced Practice Practitioners on your physician's team can help you by facilitating care more promptly, often allowing for earlier appointments, access to diagnostic testing, procedures, and other specialty referrals.

## 2024-10-02 ENCOUNTER — Other Ambulatory Visit: Payer: Self-pay

## 2024-10-02 DIAGNOSIS — R634 Abnormal weight loss: Secondary | ICD-10-CM

## 2024-10-02 NOTE — Patient Instructions (Addendum)
 SURGICAL WAITING ROOM VISITATION Patients having surgery or a procedure may have no more than 2 support people in the waiting area - these visitors may rotate in the visitor waiting room.   Due to an increase in RSV and influenza rates and associated hospitalizations, children ages 37 and under may not visit patients in [facility] hospitals. If the patient needs to stay at the hospital during part of their recovery, the visitor guidelines for inpatient rooms apply.  PRE-OP VISITATION  Pre-op nurse will coordinate an appropriate time for 1 support person to accompany the patient in pre-op.  This support person may not rotate.  This visitor will be contacted when the time is appropriate for the visitor to come back in the pre-op area.  Please refer to the [facility] website for the visitor guidelines for Inpatients (after your surgery is over and you are in a regular room).  You are not required to quarantine at this time prior to your surgery. However, you must do this: Hand Hygiene often Do NOT share personal items Notify your provider if you are in close contact with someone who has COVID or you develop fever 100.4 or greater, new onset of sneezing, cough, sore throat, shortness of breath or body aches.  If you test positive for Covid or have been in contact with anyone that has tested positive in the last 10 days please notify you surgeon.    Your procedure is scheduled on: 10/10/24   Report to [facility] Main Entrance: [employer] entrance where the [employer] is available.   Report to admitting at: 6:15 AM  Call this number if you have any questions or problems the morning of surgery [phone]  FOLLOW ANY ADDITIONAL PRE OP INSTRUCTIONS YOU RECEIVED FROM YOUR SURGEON'S OFFICE!!! : BOWEL PREP.  Clear liquids starting the day before surgery until : 5:30 AM DAY OF SURGERY. Take plenty clear liquids the day before surgery.  Clear Liquid Diet Water  Black Coffee (sugar ok,  NO MILK/CREAM OR CREAMERS)  Tea (sugar ok, NO MILK/CREAM OR CREAMERS) regular and decaf                             Plain Jell-O  with no fruit (NO RED)                                           Fruit ices (not with fruit pulp, NO RED)                                     Popsicles (NO RED)                                                                  Juice: NO CITRUS JUICES: only apple, WHITE grape, WHITE cranberry Sports drinks like Gatorade or Powerade (NO RED)   Oral Hygiene is also important to reduce your risk of infection.        Remember - BRUSH YOUR TEETH THE MORNING OF SURGERY WITH YOUR REGULAR TOOTHPASTE  Do  NOT smoke after Midnight the night before surgery.  STOP TAKING all Vitamins, Herbs and supplements 1 week before your surgery.   Take ONLY these medicines the morning of surgery with A SIP OF WATER : gabapentin ,carvedilol ,azathioprine .Omeprazole as needed.Eye drops as usual.  If You have been diagnosed with Sleep Apnea - Bring CPAP mask and tubing day of surgery. We will provide you with a CPAP machine on the day of your surgery.                   You may not have any metal on your body including hair pins, jewelry, and body piercing  Do not wear make-up, lotions, powders, perfumes / cologne, or deodorant  Do not wear nail polish including gel and S&S, artificial / acrylic nails, or any other type of covering on natural nails including finger and toenails. If you have artificial nails, gel coating, etc., that needs to be removed by a nail salon, Please have this removed prior to surgery. Not doing so may mean that your surgery could be cancelled or delayed if the Surgeon or anesthesia staff feels like they are unable to monitor you safely.   Do not shave 48 hours prior to surgery to avoid nicks in your skin which may contribute to postoperative infections.   Contacts, Hearing Aids, dentures or bridgework may not be worn into surgery. DENTURES WILL BE REMOVED PRIOR TO  SURGERY PLEASE DO NOT APPLY Poly grip OR ADHESIVES!!!  You may bring a small overnight bag with you on the day of surgery, only pack items that are not valuable. [facility] IS NOT RESPONSIBLE   FOR VALUABLES THAT ARE LOST OR STOLEN.   Patients discharged on the day of surgery will not be allowed to drive home.  Someone NEEDS to stay with you for the first 24 hours after anesthesia.  Do not bring your home medications to the hospital. The Pharmacy will dispense medications listed on your medication list to you during your admission in the Hospital.  Special Instructions: Bring a copy of your healthcare power of attorney and living will documents the day of surgery, if you wish to have them scanned into your [facility]- EPIC  Please read over the following fact sheets you were given: IF YOU HAVE QUESTIONS ABOUT YOUR PRE-OP INSTRUCTIONS, PLEASE CALL [phone]   [facility] - Preparing for Surgery      Before surgery, you can play an important role.  Because skin is not sterile, your skin needs to be as free of germs as possible.  You can reduce the number of germs on your skin by washing with CHG (chlorahexidine gluconate) soap before surgery.  CHG is an antiseptic cleaner which kills germs and bonds with the skin to continue killing germs even after washing. Please DO NOT use if you have an allergy to CHG or antibacterial soaps.  If your skin becomes reddened/irritated stop using the CHG and inform your nurse when you arrive at Short Stay. Do not shave (including legs and underarms) for at least 48 hours prior to the first CHG shower.  You may shave your face/neck.  Please follow these instructions carefully:  1.  Shower with CHG Soap the night before surgery ONLY (DO NOT USE THE SOAP THE MORNING OF SURGERY).  2.  If you choose to wash your hair, wash your hair first as usual with your normal  shampoo.  3.  After you shampoo, rinse your hair and body thoroughly to remove the  shampoo.                             4.  Use CHG as you would any other liquid soap.  You can apply chg directly to the skin and wash.  Gently with a scrungie or clean washcloth.  5.  Apply the CHG Soap to your body ONLY FROM THE NECK DOWN.   Do not use on face/ open                           Wound or open sores. Avoid contact with eyes, ears mouth and genitals (private parts).                       Wash face,  Genitals (private parts) with your normal soap.             6.  Wash thoroughly, paying special attention to the area where your  surgery  will be performed.  7.  Thoroughly rinse your body with warm water  from the neck down.  8.  DO NOT shower/wash with your normal soap after using and rinsing off the CHG Soap.                9.  Pat yourself dry with a clean towel.            10.  Wear clean pajamas.            11.  Place clean sheets on your bed the night of your first shower and do not  sleep with pets.  Day of Surgery : Do not apply any CHG, lotions/deodorants the morning of surgery.  Please wear clean clothes to the hospital/surgery center.   FAILURE TO FOLLOW THESE INSTRUCTIONS MAY RESULT IN THE CANCELLATION OF YOUR SURGERY  PATIENT SIGNATURE_________________________________  NURSE SIGNATURE__________________________________  ________________________________________________________________________ WHAT IS A BLOOD TRANSFUSION? Blood Transfusion Information  A transfusion is the replacement of blood or some of its parts. Blood is made up of multiple cells which provide different functions. Red blood cells carry oxygen and are used for blood loss replacement. White blood cells fight against infection. Platelets control bleeding. Plasma helps clot blood. Other blood products are available for specialized needs, such as hemophilia or other clotting disorders. BEFORE THE TRANSFUSION  Who gives blood for transfusions?  Healthy volunteers who are fully evaluated to make sure  their blood is safe. This is blood bank blood. Transfusion therapy is the safest it has ever been in the practice of medicine. Before blood is taken from a donor, a complete history is taken to make sure that person has no history of diseases nor engages in risky social behavior (examples are intravenous drug use or sexual activity with multiple partners). The donor's travel history is screened to minimize risk of transmitting infections, such as malaria. The donated blood is tested for signs of infectious diseases, such as HIV and hepatitis. The blood is then tested to be sure it is compatible with you in order to minimize the chance of a transfusion reaction. If you or a relative donates blood, this is often done in anticipation of surgery and is not appropriate for emergency situations. It takes many days to process the donated blood. RISKS AND COMPLICATIONS Although transfusion therapy is very safe and saves many lives, the main dangers of transfusion include:  Getting an infectious disease. Developing a transfusion  reaction. This is an allergic reaction to something in the blood you were given. Every precaution is taken to prevent this. The decision to have a blood transfusion has been considered carefully by your caregiver before blood is given. Blood is not given unless the benefits outweigh the risks. AFTER THE TRANSFUSION Right after receiving a blood transfusion, you will usually feel much better and more energetic. This is especially true if your red blood cells have gotten low (anemic). The transfusion raises the level of the red blood cells which carry oxygen, and this usually causes an energy increase. The nurse administering the transfusion will monitor you carefully for complications. HOME CARE INSTRUCTIONS  No special instructions are needed after a transfusion. You may find your energy is better. Speak with your caregiver about any limitations on activity for underlying diseases you may  have. SEEK MEDICAL CARE IF:  Your condition is not improving after your transfusion. You develop redness or irritation at the intravenous (IV) site. SEEK IMMEDIATE MEDICAL CARE IF:  Any of the following symptoms occur over the next 12 hours: Shaking chills. You have a temperature by mouth above 102 F (38.9 C), not controlled by medicine. Chest, back, or muscle pain. People around you feel you are not acting correctly or are confused. Shortness of breath or difficulty breathing. Dizziness and fainting. You get a rash or develop hives. You have a decrease in urine output. Your urine turns a dark color or changes to pink, red, or brown. Any of the following symptoms occur over the next 10 days: You have a temperature by mouth above 102 F (38.9 C), not controlled by medicine. Shortness of breath. Weakness after normal activity. The white part of the eye turns yellow (jaundice). You have a decrease in the amount of urine or are urinating less often. Your urine turns a dark color or changes to pink, red, or brown. Document Released: 09/08/2000 Document Revised: 12/04/2011 Document Reviewed: 04/27/2008 [facility] Patient Information 2014 [place], [place].  _______________________________________________________________________

## 2024-10-02 NOTE — Progress Notes (Signed)
 Agree with assessment plan as outlined.  [name] - given dramatic weight loss without other clear etiology, I would recommend adding EGD to the colonoscopy if you can have the nurse reach out to the patient to see if she is willing (if no EGD has been done yet)

## 2024-10-02 NOTE — Progress Notes (Signed)
 Added egd to colon appt. Left message for pt that due to wt loss and egd has also been recommended. Asked pt to call back if she does not want to add the EGD. Amb ref also added for EGD.

## 2024-10-03 ENCOUNTER — Other Ambulatory Visit: Payer: Self-pay

## 2024-10-03 ENCOUNTER — Encounter (HOSPITAL_COMMUNITY)
Admission: RE | Admit: 2024-10-03 | Discharge: 2024-10-03 | Disposition: A | Source: Ambulatory Visit | Attending: Urology

## 2024-10-03 ENCOUNTER — Encounter (HOSPITAL_COMMUNITY): Payer: Self-pay

## 2024-10-03 DIAGNOSIS — Z01812 Encounter for preprocedural laboratory examination: Secondary | ICD-10-CM | POA: Insufficient documentation

## 2024-10-03 HISTORY — DX: Atherosclerotic heart disease of native coronary artery without angina pectoris: I25.10

## 2024-10-03 HISTORY — DX: Anemia, unspecified: D64.9

## 2024-10-03 LAB — BASIC METABOLIC PANEL WITH GFR
Anion gap: 9 (ref 5–15)
BUN: 27 mg/dL — ABNORMAL HIGH (ref 8–23)
CO2: 28 mmol/L (ref 22–32)
Calcium: 10.2 mg/dL (ref 8.9–10.3)
Chloride: 103 mmol/L (ref 98–111)
Creatinine, Ser: 1.23 mg/dL — ABNORMAL HIGH (ref 0.44–1.00)
GFR, Estimated: 47 mL/min — ABNORMAL LOW
Glucose, Bld: 102 mg/dL — ABNORMAL HIGH (ref 70–99)
Potassium: 4.3 mmol/L (ref 3.5–5.1)
Sodium: 140 mmol/L (ref 135–145)

## 2024-10-03 LAB — CBC
HCT: 37.1 % (ref 36.0–46.0)
Hemoglobin: 12.4 g/dL (ref 12.0–15.0)
MCH: 33 pg (ref 26.0–34.0)
MCHC: 33.4 g/dL (ref 30.0–36.0)
MCV: 98.7 fL (ref 80.0–100.0)
Platelets: 268 K/uL (ref 150–400)
RBC: 3.76 MIL/uL — ABNORMAL LOW (ref 3.87–5.11)
RDW: 12.7 % (ref 11.5–15.5)
WBC: 8.7 K/uL (ref 4.0–10.5)
nRBC: 0 % (ref 0.0–0.2)

## 2024-10-03 NOTE — Progress Notes (Signed)
 For Anesthesia: PCP - Shayne Anes, MD  Cardiologist - Verlin Lonni BIRCH, MD LOV: 05/14/24  Bowel Prep reminder:Reviewed  Chest x-ray -  EKG - 05/14/24 Stress Test -  ECHO - 2017 Cardiac Cath -  Pacemaker/ICD device last checked: Pacemaker orders received: Device Rep notified:  Spinal Cord Stimulator:N/A  Sleep Study - N/A CPAP -   Fasting Blood Sugar - N/A Checks Blood Sugar _____ times a day Date and result of last Hgb A1c-  Last dose of GLP1 agonist- N/A GLP1 instructions: Hold 7 days prior to schedule (Hold 24 hours-daily)   Last dose of SGLT-2 inhibitors- N/A SGLT-2 instructions: Hold 72 hours prior to surgery  Blood Thinner Instructions:N/A Last Dose: Time last taken:  Aspirin  Instructions: To hold 7 days before surgery Last Dose: Time last taken:  Activity level: Can go up a flight of stairs and activities of daily living without stopping and without chest pain and/or shortness of breath   Able to exercise without chest pain and/or shortness of breath  Anesthesia review: Hx: HTN,CAD  Patient denies shortness of breath, fever, cough and chest pain at PAT appointment   Patient verbalized understanding of instructions that were reviewed over the telephone.

## 2024-10-03 NOTE — Progress Notes (Signed)
 Spoke to patient to advise of Dr Hassan recommendation to add endoscopy on to previously scheduled colonoscopy due to profound weight loss. Patient is in agreement with adding endoscopy on to colonoscopy.

## 2024-10-03 NOTE — Telephone Encounter (Signed)
 Type of request: Is this prior authorization request for initial or ongoing therapy? Continuation  Medication: G7222 Vabysmo  (faricimab ) for OD   Date of scheduled injection: 11/11/2024   Patient Diagnosis/ICD10 Code:  Y64.6788  Ordering Provider: Paticia Fairly  Place of service preference: [facility]

## 2024-10-08 ENCOUNTER — Encounter: Admitting: *Deleted

## 2024-10-08 VITALS — BP 150/73 | HR 89 | Temp 98.1°F | Resp 16 | Wt 149.0 lb

## 2024-10-08 DIAGNOSIS — Z006 Encounter for examination for normal comparison and control in clinical research program: Secondary | ICD-10-CM

## 2024-10-08 MED ORDER — STUDY - VICTORION-1 PREVENT - INCLISIRAN 300 MG/1.5 ML OR PLACEBO SQ INJECTION (PI-CHRISTOPHER)
300.0000 mg | INJECTION | SUBCUTANEOUS | Status: AC
  Administered 2024-10-08: 300 mg via SUBCUTANEOUS
  Filled 2024-10-08: qty 1.5

## 2024-10-08 NOTE — Research (Addendum)
 DATE: 08-Oct-2024  SUBJECT ID: [id number]   Visit: 6       Prior or concomitant medications reviewed [x]   Lipid lowering medications reviewed [x]   none  Surgical history reviewed [x]   TIME: BP: PULSE:  0810 149/68 65  0816 152/78 63  0821 150/73 89  Mean BP: 150/73  Mean Pulse: 72  Labs collected at: 0825 Fasting Lipid Profile [x]  Fasting Lp(a) [x]  Liver function tests []  Exploratory biomarkers []   AE/SAE assessment completed [x]   Study drug administered [x]   Endpoint assessment completed [x]   [name] is here for V-1 Prevent study visit 6. Patient denies abdominal pain, nausea/vomiting. Denies any ED or urgent care visits since last seen in research clinic. Denies signs/symptoms of pancreatitis. Denies other pain.    Past and current medical history reviewed. Surgical history reviewed. Medications reviewed with patient during visit. She states she is having surgery on 10-Oct-2024 with Dr Alvaro. She is also scheduled for a colonoscopy 28-November-2024. Lifestyle instructions given.  Current Medications[1]     [1]  Current Outpatient Medications:    aspirin  EC 81 MG tablet, Take 81 mg by mouth in the morning. Swallow whole., Disp: , Rfl:    azaTHIOprine  (IMURAN ) 50 MG tablet, Take 1 tablet (50 mg total) by mouth daily., Disp: 90 tablet, Rfl: 4   carvedilol  (COREG ) 12.5 MG tablet, Take 6.25 mg by mouth 2 (two) times daily with a meal., Disp: , Rfl:    chlorthalidone  (HYGROTON ) 25 MG tablet, Take 25 mg by mouth every Monday, Wednesday, and Friday. In the morning., Disp: , Rfl:    cholecalciferol  (VITAMIN D3) 25 MCG (1000 UNIT) tablet, Take 1,000 Units by mouth in the morning., Disp: , Rfl:    cyanocobalamin (VITAMIN B12) 1000 MCG tablet, Take 1,000 mcg by mouth at bedtime., Disp: , Rfl:    Faricimab -svoa (VABYSMO  IZ), 1 Dose by Intravitreal route every 6 (six) weeks. Right eye, Disp: , Rfl:    gabapentin  (NEURONTIN ) 300 MG capsule, Take 300-600 mg by mouth See admin  instructions. Take 1 capsule (300 mg) by mouth every morning & take 2 capsules (600 mg) by mouth every night., Disp: , Rfl:    melatonin 5 MG TABS, Take 10 mg by mouth at bedtime., Disp: , Rfl:    Multiple Vitamins-Minerals (CENTRUM SILVER WOMEN 50+) TABS, Take 1 tablet by mouth daily., Disp: , Rfl:    omeprazole (PRILOSEC) 20 MG capsule, Take 20 mg by mouth daily as needed (Heartburn)., Disp: , Rfl: 8   OVER THE COUNTER MEDICATION, Take 1 tablet by mouth at bedtime. Opti Gold, Disp: , Rfl:    polyethylene glycol powder (GLYCOLAX /MIRALAX ) 17 GM/SCOOP powder, Take 17 g by mouth daily as needed (constipation.)., Disp: , Rfl:    psyllium (REGULOID) 0.52 g capsule, Take 0.52 g by mouth daily., Disp: , Rfl:    rOPINIRole  (REQUIP ) 0.5 MG tablet, Take 0.5 mg by mouth at bedtime., Disp: , Rfl:    Study - VICTORION-1 PREVENT - inclisiran 300 mg/1.5mL or placebo SQ injection (PI-Stuckey), Inject 300 mg into the skin every 6 (six) months. For Investigational Use Only. Inject subcutaneously into the abdomen, upper arm or thigh every 6 months. Do not inject into areas of active skin disease,such as sun burns, skin rashes, inflammation, or skin infections.Please contact [facility] for any questions or concerns., Disp: , Rfl:    docusate sodium  (COLACE) 100 MG capsule, Take 100 mg by mouth at bedtime. (Patient not taking: Reported on 10/08/2024), Disp: , Rfl:  senna-docusate (SENOKOT-S) 8.6-50 MG tablet, Take 1 tablet by mouth 2 (two) times daily. While taking strong pain meds to prevent constipation (Patient not taking: Reported on 10/08/2024), Disp: 10 tablet, Rfl: 0

## 2024-10-09 NOTE — Progress Notes (Signed)
 " Case: [id number] Date/Time: 10/10/24 0815   Procedures:      URETEROURETEROSTOMY, ROBOT ASSISTED (Left) - ROBOT ASSISTED URETEROURETEROSTOMY     CYSTOSCOPY WITH INDOCYANINE GREEN IMAGING (ICG) (Left)     CYSTOSCOPY, WITH RETROGRADE PYELOGRAM (Left)   Anesthesia type: General   Diagnosis: Hydronephrosis with ureteral stricture [N13.1]   Pre-op diagnosis: URETERAL STRICTURE   [facility]: [facility] 05 [facility]   Surgeons: Alvaro Ricardo KATHEE Mickey., MD       DISCUSSION: [name] is a 69 yo female with PMH of nonobstructive moderate CAD (by CCTA), DOE, possible OSA, HTN, GERD, hx of Hep A, anxiety, anemia, arthritis, ureteral stricture.  Patient follows by Cardiology for nonobstructive CAD and CV risk factors managed medically. Last seen on 05/14/24 by Dr. Verlin. She denied any cardiac symptoms at that time. LE arterial dopplers ordered to assess for PAD which were normal. She was advised to f/u in 1 year.  Hx of possible OSA per notes. Unclear if she had sleep study but has never been prescribed a CPAP. PCP notes state that patient denies this hx and she sleeps well.  At PAT visit she reports she can do a flight of stairs without CP/SOB. Anticipate she can proceed.  VS: BP (!) 154/85   Pulse 67   Temp 36.9 C (Oral)   Ht 5' 6 (1.676 m)   Wt 67.6 kg   SpO2 100%   BMI 24.05 kg/m   PROVIDERS: Shayne Anes, MD   LABS: Labs reviewed: Acceptable for surgery. (all labs ordered are listed, but only abnormal results are displayed)  Labs Reviewed  CBC - Abnormal; Notable for the following components:      Result Value   RBC 3.76 (*)    All other components within normal limits  BASIC METABOLIC PANEL WITH GFR - Abnormal; Notable for the following components:   Glucose, Bld 102 (*)    BUN 27 (*)    Creatinine, Ser 1.23 (*)    GFR, Estimated 47 (*)    All other components within normal limits  TYPE AND SCREEN    EKG 05/14/24:  Sinus bradycardia, rate 58 Biatrial  enlargement   CCTA 05/02/2022:  IMPRESSION: 1. Coronary calcium  score of 670. This was 96th percentile for age and sex matched control.   2. Normal coronary origin with right dominance.   3. Step artifacts due to respiratory motion during acquisition   4. Mixed plaque in the proximal to mid LAD causes moderate (50-69%) stenosis. There are high risk plaque features including positive remodeling, low attenuation plaque, and napkin ring sign   5. Calcified plaque in the proximal LCX causes moderate (50-69%) stenosis. Noncalcified plaque in mid LCX causes moderate (50-69%) stenosis.   6. Calcified plaque in ostial OM1 causes moderate (50-69%) stenosis, though small vessel (~23mm in diameter)   7. Mixed plaque in the proximal RCA causes mild (25-49%) stenosis. Calcified plaque in the mid RCA causes mild (25-49%) stenosis.   8.  Calcified plaque in left main causes minimal (0-24%) stenosis   9.  Will send study for CTFFR   CAD-RADS 3. Moderate stenosis. Consider symptom-guided anti-ischemic pharmacotherapy as well as risk factor modification per guideline directed care. Additional analysis with CT FFR will be submitted.  FFR 05/02/2022:  IMPRESSION: 1. CTFFR suggests nonobstructive CAD in LAD and LCX. RCA was unable to be evaluated by FFR due to artifact, but appeared nonobstructive on CTA.  Echo 04/19/2016:  Study Conclusions  - Left ventricle: The cavity  size was normal. There was mild   concentric hypertrophy. Systolic function was normal. The   estimated ejection fraction was in the range of 60% to 65%. Wall   motion was normal; there were no regional wall motion   abnormalities. Left ventricular diastolic function parameters   were normal. - Atrial septum: No defect or patent foramen ovale was identified.  Past Medical History:  Diagnosis Date   Anemia    Anxiety    Arthritis    Carpal tunnel syndrome    Right hand   Coronary artery disease    Deviated septum     Dyspnea    with exertion   wheezing at night   Fibroid tumor    GERD (gastroesophageal reflux disease) 11/02/2015   Hepatitis    AS CHILD  , 4th grade   History of kidney stones    Hypercholesteremia 07/21/2015   Hypertension 07/10/2015   Macular degeneration    Right   Memory loss    Meniscus tear    Numbness in feet    Optic neuritis    Right   Pneumonia    Walking   PONV (postoperative nausea and vomiting)    Restless leg syndrome 09/01/2015   Retinal detachment 03/06/2001   Spinal stenosis    Urinary frequency    Vertigo     Past Surgical History:  Procedure Laterality Date   ANTERIOR CERVICAL DECOMP/DISCECTOMY FUSION N/A 11/02/2015   Procedure: ANTERIOR CERVICAL DECOMPRESSION FUSION CERVICAL FIVE-SIX,CERVICAL SIX-SEVEN;  Surgeon: Darina Boehringer, MD;  Location: [facility];  Service: Neurosurgery;  Laterality: N/A;  right side approach   BACK SURGERY     mild   CATARACT EXTRACTION Bilateral    CYSTOSCOPY W/ RETROGRADES Left 08/20/2024   Procedure: CYSTOSCOPY, WITH RETROGRADE PYELOGRAM;  Surgeon: Alvaro Ricardo KATHEE Mickey., MD;  Location: [facility];  Service: Urology;  Laterality: Left;   CYSTOSCOPY/URETEROSCOPY/HOLMIUM LASER/STENT PLACEMENT Left 04/15/2024   Procedure: CYSTOSCOPY/URETEROSCOPY/HOLMIUM LASER/STENT PLACEMENT;  Surgeon: Elisabeth Valli BIRCH, MD;  Location: [facility];  Service: Urology;  Laterality: Left;  CYSTOSCOPY/LEFT URETEROSCOPY/HOLMIUM LASER/STENT PLACEMENT   CYSTOSCOPY/URETEROSCOPY/HOLMIUM LASER/STENT PLACEMENT Left 05/08/2024   Procedure: CYSTOSCOPY/URETEROSCOPY/HOLMIUM LASER/STENT PLACEMENT;  Surgeon: Elisabeth Valli BIRCH, MD;  Location: [facility];  Service: Urology;  Laterality: Left;  CYSTOSCOPY/LEFT URETEROSCOPY/HOLMIUM LASER/STENT EXCHANGE   CYSTOSCOPY/URETEROSCOPY/HOLMIUM LASER/STENT PLACEMENT Left 08/20/2024   Procedure: CYSTOSCOPY/DIAGNOSTIC URETEROSCOPY/HOLMIUM LASER;  Surgeon: Alvaro Ricardo KATHEE Mickey., MD;  Location: [facility];  Service: Urology;  Laterality: Left;    fibroid tumor     KNEE SURGERY Bilateral    RETINAL DETACHMENT SURGERY Right 03/06/2001   TOTAL KNEE ARTHROPLASTY Right 11/08/2017   Procedure: RIGHT TOTAL KNEE ARTHROPLASTY;  Surgeon: Jerri Kay HERO, MD;  Location: [facility];  Service: Orthopedics;  Laterality: Right;    MEDICATIONS:  aspirin  EC 81 MG tablet   azaTHIOprine  (IMURAN ) 50 MG tablet   carvedilol  (COREG ) 12.5 MG tablet   chlorthalidone  (HYGROTON ) 25 MG tablet   cholecalciferol  (VITAMIN D3) 25 MCG (1000 UNIT) tablet   cyanocobalamin (VITAMIN B12) 1000 MCG tablet   docusate sodium  (COLACE) 100 MG capsule   Faricimab -svoa (VABYSMO  IZ)   gabapentin  (NEURONTIN ) 300 MG capsule   melatonin 5 MG TABS   Multiple Vitamins-Minerals (CENTRUM SILVER WOMEN 50+) TABS   omeprazole (PRILOSEC) 20 MG capsule   OVER THE COUNTER MEDICATION   polyethylene glycol powder (GLYCOLAX /MIRALAX ) 17 GM/SCOOP powder   psyllium (REGULOID) 0.52 g capsule   rOPINIRole  (REQUIP ) 0.5 MG tablet   senna-docusate (SENOKOT-S) 8.6-50 MG tablet   Study -  VICTORION-1 PREVENT - inclisiran 300 mg/1.10mL or placebo SQ injection (PI-Stuckey)   No current facility-administered medications for this encounter.    [name] [facility] Short [facility] Phone [phone] 10/09/2024 8:57 AM       "

## 2024-10-09 NOTE — Anesthesia Preprocedure Evaluation (Addendum)
"                                    Anesthesia Evaluation  Patient identified by MRN, date of birth, ID band Patient awake    Reviewed: Allergy & Precautions, NPO status , Patient's Chart, lab work & pertinent test results  History of Anesthesia Complications (+) PONV  Airway Mallampati: II  TM Distance: >3 FB Neck ROM: Full    Dental  (+) Dental Advisory Given, Caps   Pulmonary sleep apnea (does not use CPAP)    breath sounds clear to auscultation       Cardiovascular hypertension, Pt. on medications and Pt. on home beta blockers (-) angina + CAD (non-obstructive by CT) and + Peripheral Vascular Disease   Rhythm:Regular Rate:Normal  '23 Coronary CT CTFFR suggests nonobstructive CAD in LAD and LCX. RCA was unable to be evaluated by FFR due to artifact, but appeared nonobstructive on CTA.  '17 ECHO: - Left ventricle: The cavity size was normal. There was mild    concentric LVH. Systolic function was normal. EF 60% to 65%. Wall    motion was normal; no regional wall motion abnormalities. Left ventricular diastolic  function parameters were normal.  - Atrial septum: No defect or patent foramen ovale was identified.  -no significant valvular abnormalities    Neuro/Psych   Anxiety        GI/Hepatic Neg liver ROS,GERD  Medicated and Controlled,,  Endo/Other  negative endocrine ROS    Renal/GU Renal InsufficiencyRenal diseasestone     Musculoskeletal   Abdominal   Peds  Hematology  (+) Blood dyscrasia (Hb 12.4, plt 268k) Hb 12.4, plt 268k   Anesthesia Other Findings   Reproductive/Obstetrics                              Anesthesia Physical Anesthesia Plan  ASA: 3  Anesthesia Plan: General   Post-op Pain Management: Tylenol  PO (pre-op)*   Induction: Intravenous  PONV Risk Score and Plan: 4 or greater and Ondansetron , Dexamethasone  and Droperidol  Airway Management Planned: Oral ETT  Additional Equipment:  None  Intra-op Plan:   Post-operative Plan: Extubation in OR  Informed Consent: I have reviewed the patients History and Physical, chart, labs and discussed the procedure including the risks, benefits and alternatives for the proposed anesthesia with the patient or authorized representative who has indicated his/her understanding and acceptance.     Dental advisory given  Plan Discussed with: CRNA and Surgeon  Anesthesia Plan Comments: (See PAT note from 1/9)         Anesthesia Quick Evaluation  "

## 2024-10-10 ENCOUNTER — Inpatient Hospital Stay (HOSPITAL_COMMUNITY)

## 2024-10-10 ENCOUNTER — Inpatient Hospital Stay (HOSPITAL_COMMUNITY): Admitting: Anesthesiology

## 2024-10-10 ENCOUNTER — Encounter (HOSPITAL_COMMUNITY): Payer: Self-pay | Admitting: Medical

## 2024-10-10 ENCOUNTER — Inpatient Hospital Stay (HOSPITAL_COMMUNITY)
Admission: RE | Admit: 2024-10-10 | Discharge: 2024-10-12 | DRG: 661 | Disposition: A | Discharge status: Home / Self Care | Attending: Urology | Admitting: Urology

## 2024-10-10 ENCOUNTER — Encounter (HOSPITAL_COMMUNITY)
Admission: RE | Disposition: A | Discharge status: Home / Self Care | Payer: Self-pay | Source: Home / Self Care | Attending: Urology

## 2024-10-10 ENCOUNTER — Encounter (HOSPITAL_COMMUNITY): Payer: Self-pay | Admitting: Urology

## 2024-10-10 ENCOUNTER — Other Ambulatory Visit: Payer: Self-pay

## 2024-10-10 DIAGNOSIS — Z96651 Presence of right artificial knee joint: Secondary | ICD-10-CM | POA: Diagnosis present

## 2024-10-10 DIAGNOSIS — I739 Peripheral vascular disease, unspecified: Secondary | ICD-10-CM | POA: Diagnosis present

## 2024-10-10 DIAGNOSIS — Z888 Allergy status to other drugs, medicaments and biological substances status: Secondary | ICD-10-CM

## 2024-10-10 DIAGNOSIS — E7800 Pure hypercholesterolemia, unspecified: Secondary | ICD-10-CM | POA: Diagnosis present

## 2024-10-10 DIAGNOSIS — Z823 Family history of stroke: Secondary | ICD-10-CM

## 2024-10-10 DIAGNOSIS — Z8349 Family history of other endocrine, nutritional and metabolic diseases: Secondary | ICD-10-CM

## 2024-10-10 DIAGNOSIS — Z87442 Personal history of urinary calculi: Secondary | ICD-10-CM

## 2024-10-10 DIAGNOSIS — Z7982 Long term (current) use of aspirin: Secondary | ICD-10-CM

## 2024-10-10 DIAGNOSIS — M1990 Unspecified osteoarthritis, unspecified site: Secondary | ICD-10-CM | POA: Diagnosis present

## 2024-10-10 DIAGNOSIS — Z981 Arthrodesis status: Secondary | ICD-10-CM

## 2024-10-10 DIAGNOSIS — G4730 Sleep apnea, unspecified: Secondary | ICD-10-CM | POA: Diagnosis present

## 2024-10-10 DIAGNOSIS — Z79899 Other long term (current) drug therapy: Secondary | ICD-10-CM

## 2024-10-10 DIAGNOSIS — N135 Crossing vessel and stricture of ureter without hydronephrosis: Principal | ICD-10-CM | POA: Diagnosis present

## 2024-10-10 DIAGNOSIS — I2510 Atherosclerotic heart disease of native coronary artery without angina pectoris: Secondary | ICD-10-CM | POA: Diagnosis not present

## 2024-10-10 DIAGNOSIS — H3530 Unspecified macular degeneration: Secondary | ICD-10-CM | POA: Diagnosis present

## 2024-10-10 DIAGNOSIS — K219 Gastro-esophageal reflux disease without esophagitis: Secondary | ICD-10-CM | POA: Diagnosis present

## 2024-10-10 DIAGNOSIS — G2581 Restless legs syndrome: Secondary | ICD-10-CM | POA: Diagnosis present

## 2024-10-10 DIAGNOSIS — Z8052 Family history of malignant neoplasm of bladder: Secondary | ICD-10-CM

## 2024-10-10 DIAGNOSIS — I10 Essential (primary) hypertension: Secondary | ICD-10-CM

## 2024-10-10 DIAGNOSIS — Z8249 Family history of ischemic heart disease and other diseases of the circulatory system: Secondary | ICD-10-CM

## 2024-10-10 DIAGNOSIS — Z79624 Long term (current) use of inhibitors of nucleotide synthesis: Secondary | ICD-10-CM

## 2024-10-10 DIAGNOSIS — F419 Anxiety disorder, unspecified: Secondary | ICD-10-CM | POA: Diagnosis present

## 2024-10-10 HISTORY — PX: CYSTOSCOPY W/ RETROGRADES: SHX1426

## 2024-10-10 HISTORY — PX: CYSTOSCOPY WITH INDOCYANINE GREEN IMAGING (ICG): SHX7549

## 2024-10-10 LAB — TYPE AND SCREEN
ABO/RH(D): A POS
Antibody Screen: NEGATIVE

## 2024-10-10 LAB — BASIC METABOLIC PANEL WITH GFR
Anion gap: 9 (ref 5–15)
BUN: 23 mg/dL (ref 8–23)
CO2: 28 mmol/L (ref 22–32)
Calcium: 9.3 mg/dL (ref 8.9–10.3)
Chloride: 104 mmol/L (ref 98–111)
Creatinine, Ser: 1.16 mg/dL — ABNORMAL HIGH (ref 0.44–1.00)
GFR, Estimated: 50 mL/min — ABNORMAL LOW
Glucose, Bld: 125 mg/dL — ABNORMAL HIGH (ref 70–99)
Potassium: 4.8 mmol/L (ref 3.5–5.1)
Sodium: 140 mmol/L (ref 135–145)

## 2024-10-10 LAB — HEMOGLOBIN AND HEMATOCRIT, BLOOD
HCT: 37.7 % (ref 36.0–46.0)
Hemoglobin: 12.7 g/dL (ref 12.0–15.0)

## 2024-10-10 MED ORDER — STERILE WATER FOR INJECTION IJ SOLN
INTRAMUSCULAR | Status: DC | PRN
  Administered 2024-10-10: 10 mL via INTRAMUSCULAR

## 2024-10-10 MED ORDER — FENTANYL CITRATE (PF) 100 MCG/2ML IJ SOLN
INTRAMUSCULAR | Status: AC
  Filled 2024-10-10: qty 2

## 2024-10-10 MED ORDER — CARVEDILOL 6.25 MG PO TABS
6.2500 mg | ORAL_TABLET | Freq: Two times a day (BID) | ORAL | Status: DC
  Administered 2024-10-10 – 2024-10-12 (×4): 6.25 mg via ORAL
  Filled 2024-10-10 (×4): qty 1

## 2024-10-10 MED ORDER — DEXAMETHASONE SOD PHOSPHATE PF 10 MG/ML IJ SOLN
INTRAMUSCULAR | Status: AC
  Filled 2024-10-10: qty 1

## 2024-10-10 MED ORDER — ROCURONIUM BROMIDE 100 MG/10ML IV SOLN
INTRAVENOUS | Status: DC | PRN
  Administered 2024-10-10: 20 mg via INTRAVENOUS
  Administered 2024-10-10: 60 mg via INTRAVENOUS
  Administered 2024-10-10: 10 mg via INTRAVENOUS

## 2024-10-10 MED ORDER — PROPOFOL 10 MG/ML IV BOLUS
INTRAVENOUS | Status: AC
  Filled 2024-10-10: qty 20

## 2024-10-10 MED ORDER — ONDANSETRON HCL 4 MG/2ML IJ SOLN
4.0000 mg | INTRAMUSCULAR | Status: DC | PRN
  Administered 2024-10-10: 4 mg via INTRAVENOUS
  Filled 2024-10-10: qty 2

## 2024-10-10 MED ORDER — CEFAZOLIN SODIUM 1 G IJ SOLR
INTRAMUSCULAR | Status: AC
  Filled 2024-10-10: qty 10

## 2024-10-10 MED ORDER — ACETAMINOPHEN 500 MG PO TABS
1000.0000 mg | ORAL_TABLET | Freq: Four times a day (QID) | ORAL | Status: DC
  Administered 2024-10-10 – 2024-10-12 (×6): 1000 mg via ORAL
  Filled 2024-10-10 (×6): qty 2

## 2024-10-10 MED ORDER — MAGNESIUM CITRATE PO SOLN: 1.0000 | Freq: Once | ORAL | Status: DC

## 2024-10-10 MED ORDER — SENNOSIDES-DOCUSATE SODIUM 8.6-50 MG PO TABS: 1.0000 | ORAL_TABLET | Freq: Two times a day (BID) | ORAL | 0 refills | Status: AC

## 2024-10-10 MED ORDER — WATER FOR IRRIGATION, STERILE IR SOLN
Status: DC | PRN
  Administered 2024-10-10: 1000 mL

## 2024-10-10 MED ORDER — SODIUM CHLORIDE (PF) 0.9 % IJ SOLN
INTRAMUSCULAR | Status: DC | PRN
  Administered 2024-10-10: 20 mL

## 2024-10-10 MED ORDER — CEFAZOLIN SODIUM-DEXTROSE 2-4 GM/100ML-% IV SOLN
2.0000 g | INTRAVENOUS | Status: AC
  Administered 2024-10-10: 2 g via INTRAVENOUS

## 2024-10-10 MED ORDER — DEXAMETHASONE SOD PHOSPHATE PF 10 MG/ML IJ SOLN
INTRAMUSCULAR | Status: DC | PRN
  Administered 2024-10-10: 10 mg via INTRAVENOUS

## 2024-10-10 MED ORDER — CHLORHEXIDINE GLUCONATE 0.12 % MT SOLN
15.0000 mL | Freq: Once | OROMUCOSAL | Status: AC
  Administered 2024-10-10: 15 mL via OROMUCOSAL

## 2024-10-10 MED ORDER — LIDOCAINE HCL (PF) 2 % IJ SOLN
INTRAMUSCULAR | Status: AC
  Filled 2024-10-10: qty 5

## 2024-10-10 MED ORDER — HYDROMORPHONE HCL 1 MG/ML IJ SOLN
0.2500 mg | INTRAMUSCULAR | Status: DC | PRN
  Administered 2024-10-10 (×4): 0.5 mg via INTRAVENOUS

## 2024-10-10 MED ORDER — PHENYLEPHRINE HCL-NACL 20-0.9 MG/250ML-% IV SOLN
INTRAVENOUS | Status: DC | PRN
  Administered 2024-10-10: 40 ug/min via INTRAVENOUS

## 2024-10-10 MED ORDER — MIDAZOLAM HCL (PF) 2 MG/2ML IJ SOLN
0.5000 mg | Freq: Once | INTRAMUSCULAR | Status: AC | PRN
  Administered 2024-10-10: 0.5 mg via INTRAVENOUS

## 2024-10-10 MED ORDER — SUGAMMADEX SODIUM 200 MG/2ML IV SOLN
INTRAVENOUS | Status: DC | PRN
  Administered 2024-10-10: 200 mg via INTRAVENOUS

## 2024-10-10 MED ORDER — MIDAZOLAM HCL 2 MG/2ML IJ SOLN
INTRAMUSCULAR | Status: AC
  Filled 2024-10-10: qty 2

## 2024-10-10 MED ORDER — LIDOCAINE HCL (CARDIAC) PF 100 MG/5ML IV SOSY
PREFILLED_SYRINGE | INTRAVENOUS | Status: DC | PRN
  Administered 2024-10-10: 40 mg via INTRAVENOUS

## 2024-10-10 MED ORDER — STERILE WATER FOR IRRIGATION IR SOLN
Status: DC | PRN
  Administered 2024-10-10: 3000 mL

## 2024-10-10 MED ORDER — HYDROMORPHONE HCL 1 MG/ML IJ SOLN
0.2500 mg | INTRAMUSCULAR | Status: DC | PRN
  Administered 2024-10-10: 0.5 mg via INTRAVENOUS

## 2024-10-10 MED ORDER — ROPINIROLE HCL 0.25 MG PO TABS
0.5000 mg | ORAL_TABLET | Freq: Every day | ORAL | Status: DC
  Administered 2024-10-10 – 2024-10-11 (×2): 0.5 mg via ORAL
  Filled 2024-10-10 (×2): qty 2

## 2024-10-10 MED ORDER — POLYETHYLENE GLYCOL 3350 17 G PO PACK: 17.0000 g | PACK | Freq: Every day | ORAL | Status: DC | PRN

## 2024-10-10 MED ORDER — OXYCODONE HCL 5 MG PO TABS
5.0000 mg | ORAL_TABLET | ORAL | Status: DC | PRN
  Administered 2024-10-10: 5 mg via ORAL
  Filled 2024-10-10: qty 1

## 2024-10-10 MED ORDER — HYDROMORPHONE HCL 1 MG/ML IJ SOLN: 0.2500 mg | INTRAMUSCULAR | Status: DC | PRN

## 2024-10-10 MED ORDER — AZATHIOPRINE 50 MG PO TABS
50.0000 mg | ORAL_TABLET | Freq: Every day | ORAL | Status: DC
  Administered 2024-10-11 – 2024-10-12 (×2): 50 mg via ORAL
  Filled 2024-10-10 (×2): qty 1

## 2024-10-10 MED ORDER — LACTATED RINGERS IV SOLN: INTRAVENOUS | Status: DC

## 2024-10-10 MED ORDER — BUPIVACAINE LIPOSOME 1.3 % IJ SUSP
INTRAMUSCULAR | Status: DC | PRN
  Administered 2024-10-10: 20 mL

## 2024-10-10 MED ORDER — HYDROMORPHONE HCL 1 MG/ML IJ SOLN
INTRAMUSCULAR | Status: AC
  Filled 2024-10-10: qty 1

## 2024-10-10 MED ORDER — SODIUM CHLORIDE (PF) 0.9 % IJ SOLN
INTRAMUSCULAR | Status: AC
  Filled 2024-10-10: qty 20

## 2024-10-10 MED ORDER — SUGAMMADEX SODIUM 200 MG/2ML IV SOLN
INTRAVENOUS | Status: AC
  Filled 2024-10-10: qty 2

## 2024-10-10 MED ORDER — MELATONIN 5 MG PO TABS
10.0000 mg | ORAL_TABLET | Freq: Every day | ORAL | Status: DC
  Administered 2024-10-10 – 2024-10-11 (×2): 10 mg via ORAL
  Filled 2024-10-10 (×2): qty 2

## 2024-10-10 MED ORDER — ONDANSETRON HCL 4 MG/2ML IJ SOLN
INTRAMUSCULAR | Status: DC | PRN
  Administered 2024-10-10: 4 mg via INTRAVENOUS

## 2024-10-10 MED ORDER — TAMSULOSIN HCL 0.4 MG PO CAPS
0.4000 mg | ORAL_CAPSULE | Freq: Every day | ORAL | Status: DC
  Administered 2024-10-10 – 2024-10-12 (×3): 0.4 mg via ORAL
  Filled 2024-10-10 (×3): qty 1

## 2024-10-10 MED ORDER — LACTATED RINGERS IV SOLN: INTRAVENOUS | Status: DC | PRN

## 2024-10-10 MED ORDER — OXYCODONE HCL 5 MG PO TABS: 5.0000 mg | ORAL_TABLET | Freq: Once | ORAL | Status: DC | PRN

## 2024-10-10 MED ORDER — BUPIVACAINE LIPOSOME 1.3 % IJ SUSP
INTRAMUSCULAR | Status: AC
  Filled 2024-10-10: qty 10

## 2024-10-10 MED ORDER — ONDANSETRON HCL 4 MG/2ML IJ SOLN
INTRAMUSCULAR | Status: AC
  Filled 2024-10-10: qty 2

## 2024-10-10 MED ORDER — 0.9 % SODIUM CHLORIDE (POUR BTL) OPTIME
TOPICAL | Status: DC | PRN
  Administered 2024-10-10: 1000 mL

## 2024-10-10 MED ORDER — FENTANYL CITRATE (PF) 100 MCG/2ML IJ SOLN
INTRAMUSCULAR | Status: DC | PRN
  Administered 2024-10-10 (×2): 50 ug via INTRAVENOUS
  Administered 2024-10-10: 100 ug via INTRAVENOUS

## 2024-10-10 MED ORDER — OXYCODONE-ACETAMINOPHEN 5-325 MG PO TABS: 1.0000 | ORAL_TABLET | Freq: Four times a day (QID) | ORAL | 0 refills | Status: AC | PRN

## 2024-10-10 MED ORDER — OXYCODONE HCL 5 MG/5ML PO SOLN: 5.0000 mg | Freq: Once | ORAL | Status: DC | PRN

## 2024-10-10 MED ORDER — PROPOFOL 10 MG/ML IV BOLUS
INTRAVENOUS | Status: DC | PRN
  Administered 2024-10-10: 150 mg via INTRAVENOUS

## 2024-10-10 MED ORDER — ROCURONIUM BROMIDE 10 MG/ML (PF) SYRINGE
PREFILLED_SYRINGE | INTRAVENOUS | Status: AC
  Filled 2024-10-10: qty 10

## 2024-10-10 MED ORDER — GABAPENTIN 300 MG PO CAPS
300.0000 mg | ORAL_CAPSULE | Freq: Every day | ORAL | Status: DC
  Administered 2024-10-11 – 2024-10-12 (×2): 300 mg via ORAL
  Filled 2024-10-10 (×2): qty 1

## 2024-10-10 MED ORDER — SODIUM CHLORIDE 0.9 % IV SOLN: INTRAVENOUS | Status: AC

## 2024-10-10 MED ORDER — GABAPENTIN 300 MG PO CAPS
600.0000 mg | ORAL_CAPSULE | Freq: Every day | ORAL | Status: DC
  Administered 2024-10-10 – 2024-10-11 (×2): 600 mg via ORAL
  Filled 2024-10-10 (×2): qty 2

## 2024-10-10 MED ORDER — CHLORHEXIDINE GLUCONATE CLOTH 2 % EX PADS
6.0000 | MEDICATED_PAD | Freq: Every day | CUTANEOUS | Status: DC
  Administered 2024-10-10: 6 via TOPICAL

## 2024-10-10 MED ORDER — ACETAMINOPHEN 500 MG PO TABS
1000.0000 mg | ORAL_TABLET | Freq: Once | ORAL | Status: AC
  Administered 2024-10-10: 1000 mg via ORAL
  Filled 2024-10-10: qty 2

## 2024-10-10 MED ORDER — MIDAZOLAM HCL 2 MG/2ML IJ SOLN: 0.5000 mg | Freq: Once | INTRAMUSCULAR | Status: DC

## 2024-10-10 MED ORDER — PANTOPRAZOLE SODIUM 40 MG PO TBEC
40.0000 mg | DELAYED_RELEASE_TABLET | Freq: Every day | ORAL | Status: DC
  Administered 2024-10-11 – 2024-10-12 (×2): 40 mg via ORAL
  Filled 2024-10-10 (×2): qty 1

## 2024-10-10 MED ORDER — ORAL CARE MOUTH RINSE: 15.0000 mL | Freq: Once | OROMUCOSAL | Status: AC

## 2024-10-10 NOTE — H&P (Signed)
 [name] is an 69 y.o. female.    Chief Complaint: Pre-OP LEFT Ureteral Reconstruction  HPI:   1 - LEFT Urolithiasis - S/p 2 stage ureteroscopy for very large UPJ stone around 04/2024 (declined PCNL).  2 - High Grade LEFT Ureteral Sricture - blind ending left mid ureter at site of previously impacted ureteral stone noted at ureteroscopy 07/2024. CT 07/2024 with preserved parenchema. 1 artery / 1 vein (lumbar below artery) left renovascular anatomy.  PMH sig for obesity, lumbago, ocular neuritis / Imuran  use. Her PCP is Oneil Neth MD.  Today [name] is seen to proceed with LEFT ureteral reconstruction for severe mid ureteral stricture. No interval fevers. Most recent UCX non-clonal, Cr 1.2, Hgb 12.    Frankly recommend left ureteral reconstruction with robotic uretero-ureterostomy in elective setting, anything short of this would wind up with chronic tubes (nephrostomy), chronic stents, or eventual renal loss. Risks, benefits, alternatives, expected peri-op course discussed. Her immune supression wtih imuran  does increase risk of peri-op complicaitons (infection, poor healing), but stil very acceptable. SHe has very good understnading of issue and options.  Past Medical History:  Diagnosis Date   Anemia    Anxiety    Arthritis    Carpal tunnel syndrome    Right hand   Coronary artery disease    Deviated septum    Dyspnea    with exertion   wheezing at night   Fibroid tumor    GERD (gastroesophageal reflux disease) 11/02/2015   Hepatitis    AS CHILD  , 4th grade   History of kidney stones    Hypercholesteremia 07/21/2015   Hypertension 07/10/2015   Macular degeneration    Right   Memory loss    Meniscus tear    Numbness in feet    Optic neuritis    Right   Pneumonia    Walking   PONV (postoperative nausea and vomiting)    Restless leg syndrome 09/01/2015   Retinal detachment 03/06/2001   Spinal stenosis    Urinary frequency    Vertigo     Past Surgical History:   Procedure Laterality Date   ANTERIOR CERVICAL DECOMP/DISCECTOMY FUSION N/A 11/02/2015   Procedure: ANTERIOR CERVICAL DECOMPRESSION FUSION CERVICAL FIVE-SIX,CERVICAL SIX-SEVEN;  Surgeon: Darina Boehringer, MD;  Location: [facility];  Service: Neurosurgery;  Laterality: N/A;  right side approach   BACK SURGERY     mild   CATARACT EXTRACTION Bilateral    CYSTOSCOPY W/ RETROGRADES Left 08/20/2024   Procedure: CYSTOSCOPY, WITH RETROGRADE PYELOGRAM;  Surgeon: Alvaro Ricardo KATHEE Mickey., MD;  Location[facility];  Service: Urology;  Laterality: Left;   CYSTOSCOPY/URETEROSCOPY/HOLMIUM LASER/STENT PLACEMENT Left 04/15/2024   Procedure: CYSTOSCOPY/URETEROSCOPY/HOLMIUM LASER/STENT PLACEMENT;  Surgeon: Elisabeth Valli BIRCH, MD;  Location: [facility];  Service: Urology;  Laterality: Left;  CYSTOSCOPY/LEFT URETEROSCOPY/HOLMIUM LASER/STENT PLACEMENT   CYSTOSCOPY/URETEROSCOPY/HOLMIUM LASER/STENT PLACEMENT Left 05/08/2024   Procedure: CYSTOSCOPY/URETEROSCOPY/HOLMIUM LASER/STENT PLACEMENT;  Surgeon: Elisabeth Valli BIRCH, MD;  Location: [facility];  Service: Urology;  Laterality: Left;  CYSTOSCOPY/LEFT URETEROSCOPY/HOLMIUM LASER/STENT EXCHANGE   CYSTOSCOPY/URETEROSCOPY/HOLMIUM LASER/STENT PLACEMENT Left 08/20/2024   Procedure: CYSTOSCOPY/DIAGNOSTIC URETEROSCOPY/HOLMIUM LASER;  Surgeon: Alvaro Ricardo KATHEE Mickey., MD;  Location: [facility];  Service: Urology;  Laterality: Left;   fibroid tumor     KNEE SURGERY Bilateral    RETINAL DETACHMENT SURGERY Right 03/06/2001   TOTAL KNEE ARTHROPLASTY Right 11/08/2017   Procedure: RIGHT TOTAL KNEE ARTHROPLASTY;  Surgeon: Jerri Kay HERO, MD;  Location: [facility];  Service: Orthopedics;  Laterality: Right;    Family History  Problem Relation Age of Onset   Stroke Mother    Arrhythmia Mother    Bladder Cancer Mother    Thyroid  disease Mother    Heart disease Father    Heart attack Father    Stroke Maternal Grandmother    Stroke Maternal Grandfather    Breast cancer Neg Hx    Social History:  reports  that she has never smoked. She has never used smokeless tobacco. She reports that she does not drink alcohol  and does not use drugs.  Allergies: Allergies[1]  Medications Prior to Admission  Medication Sig Dispense Refill   aspirin  EC 81 MG tablet Take 81 mg by mouth in the morning. Swallow whole.     azaTHIOprine  (IMURAN ) 50 MG tablet Take 1 tablet (50 mg total) by mouth daily. 90 tablet 4   carvedilol  (COREG ) 12.5 MG tablet Take 6.25 mg by mouth 2 (two) times daily with a meal.     chlorthalidone  (HYGROTON ) 25 MG tablet Take 25 mg by mouth every Monday, Wednesday, and Friday. In the morning.     cholecalciferol  (VITAMIN D3) 25 MCG (1000 UNIT) tablet Take 1,000 Units by mouth in the morning.     cyanocobalamin (VITAMIN B12) 1000 MCG tablet Take 1,000 mcg by mouth at bedtime.     docusate sodium  (COLACE) 100 MG capsule Take 100 mg by mouth at bedtime. (Patient not taking: Reported on 10/08/2024)     Faricimab -svoa (VABYSMO  IZ) 1 Dose by Intravitreal route every 6 (six) weeks. Right eye     gabapentin  (NEURONTIN ) 300 MG capsule Take 300-600 mg by mouth See admin instructions. Take 1 capsule (300 mg) by mouth every morning & take 2 capsules (600 mg) by mouth every night.     melatonin 5 MG TABS Take 10 mg by mouth at bedtime.     Multiple Vitamins-Minerals (CENTRUM SILVER WOMEN 50+) TABS Take 1 tablet by mouth daily.     omeprazole (PRILOSEC) 20 MG capsule Take 20 mg by mouth daily as needed (Heartburn).  8   OVER THE COUNTER MEDICATION Take 1 tablet by mouth at bedtime. Opti Gold     polyethylene glycol powder (GLYCOLAX /MIRALAX ) 17 GM/SCOOP powder Take 17 g by mouth daily as needed (constipation.).     psyllium (REGULOID) 0.52 g capsule Take 0.52 g by mouth daily.     rOPINIRole  (REQUIP ) 0.5 MG tablet Take 0.5 mg by mouth at bedtime.     Study - VICTORION-1 PREVENT - inclisiran 300 mg/1.22mL or placebo SQ injection (PI-Stuckey) Inject 300 mg into the skin every 6 (six) months. For Investigational  Use Only. Inject subcutaneously into the abdomen, upper arm or thigh every 6 months. Do not inject into areas of active skin disease,such as sun burns, skin rashes, inflammation, or skin infections.Please contact [facility] for any questions or concerns.     senna-docusate (SENOKOT-S) 8.6-50 MG tablet Take 1 tablet by mouth 2 (two) times daily. While taking strong pain meds to prevent constipation (Patient not taking: Reported on 10/08/2024) 10 tablet 0    No results found for this or any previous visit (from the past 48 hours). No results found.  Review of Systems  Constitutional:  Negative for chills and fever.  Genitourinary:  Positive for flank pain.  All other systems reviewed and are negative.   There were no vitals taken for this visit. Physical Exam Vitals reviewed.  HENT:     Head: Normocephalic.  Eyes:     Pupils: Pupils are equal, round, and  reactive to light.  Cardiovascular:     Rate and Rhythm: Normal rate.  Pulmonary:     Effort: Pulmonary effort is normal.  Abdominal:     General: Abdomen is flat.     Comments: Stable moderate truncal obesity.   Genitourinary:    Comments: Mild LEFT CVAT at present Musculoskeletal:        General: Normal range of motion.     Cervical back: Normal range of motion.  Skin:    General: Skin is warm.  Neurological:     General: No focal deficit present.     Mental Status: She is alert.  Psychiatric:        Mood and Affect: Mood normal.      Assessment/Plan  Proceed as planned with left ureteral reconstruction with robotic uretero-ureterostomy + cysto / stent. Risks, benefits, alternatives, expected peri-op course discussed previously and reiterated today. Her immune supression wtih imuran  does increase risk of peri-op complicaitons (infection, poor healing), but stil very acceptable.    Ricardo KATHEE Alvaro Mickey., MD 10/10/2024, 6:38 AM       [1]  Allergies Allergen Reactions   Ezetimibe Other (See  Comments)    Myalgia   Prednisone  Anxiety and Other (See Comments)    Sleep disturbances, Agitation, Confusion   Rosuvastatin Other (See Comments)    Myalgia

## 2024-10-10 NOTE — Plan of Care (Signed)

## 2024-10-10 NOTE — Transfer of Care (Signed)
 Immediate Anesthesia Transfer of Care Note  Patient: [name]  Procedure(s) Performed: URETEROURETEROSTOMY, ROBOT ASSISTED (Left) CYSTOSCOPY WITH INDOCYANINE GREEN  IMAGING (ICG) (Left) CYSTOSCOPY, WITH RETROGRADE PYELOGRAM (Left)  Patient Location: PACU  Anesthesia Type:General  Level of Consciousness: awake, alert , and oriented  Airway & Oxygen Therapy: Patient Spontanous Breathing and Patient connected to face mask oxygen  Post-op Assessment: Report given to RN and Post -op Vital signs reviewed and stable  Post vital signs: Reviewed and stable  Last Vitals:  Vitals Value Taken Time  BP 126/64 10/10/24 12:18  Temp    Pulse 72 10/10/24 12:18  Resp 16 10/10/24 12:21  SpO2 81 % 10/10/24 12:18  Vitals shown include unfiled device data.  Last Pain:  Vitals:   10/10/24 0732  TempSrc:   PainSc: 0-No pain         Complications: No notable events documented.

## 2024-10-10 NOTE — Brief Op Note (Signed)
 10/10/2024  8:30 AM  11:55 AM  PATIENT:  [name]  69 y.o. female  PRE-OPERATIVE DIAGNOSIS:  LEFT URETERAL STRICTURE  POST-OPERATIVE DIAGNOSIS:  LEFT URETERAL STRICTURE  PROCEDURE:  Procedures with comments: URETEROURETEROSTOMY, ROBOT ASSISTED (Left) - ROBOT ASSISTED URETEROURETEROSTOMY CYSTOSCOPY WITH INDOCYANINE GREEN  IMAGING (ICG) (Left) CYSTOSCOPY, WITH RETROGRADE PYELOGRAM (Left)  SURGEON:  Surgeons and Role:    * Manny, Ricardo KATHEE Raddle., MD - Primary  PHYSICIAN ASSISTANT:   ASSISTANTS: Hersch Trevedi   ANESTHESIA:   local and general  EBL:  minimal   BLOOD ADMINISTERED:none  DRAINS: JP to bulb; Foley to gravity   LOCAL MEDICATIONS USED:  MARCAINE      SPECIMEN:  Source of Specimen:  Left ureter segmetn with stricture and stone  DISPOSITION OF SPECIMEN:  PATHOLOGY  COUNTS:  YES  TOURNIQUET:  * No tourniquets in log *  DICTATION: .Other Dictation: Dictation Number [id number]  PLAN OF CARE: Admit to inpatient   PATIENT DISPOSITION:  PACU - hemodynamically stable.   Delay start of Pharmacological VTE agent (>24hrs) due to surgical blood loss or risk of bleeding: yes

## 2024-10-10 NOTE — Plan of Care (Signed)
" °  Problem: Education: Goal: Knowledge of the prescribed therapeutic regimen will improve Outcome: Progressing   Problem: Bowel/Gastric: Goal: Gastrointestinal status for postoperative course will improve Outcome: Progressing   Problem: Cardiac: Goal: Ability to maintain an adequate cardiac output Outcome: Progressing Goal: Will show no evidence of cardiac arrhythmias Outcome: Progressing   Problem: Nutritional: Goal: Will attain and maintain optimal nutritional status Outcome: Progressing   Problem: Neurological: Goal: Will regain or maintain usual level of consciousness Outcome: Progressing   Problem: Clinical Measurements: Goal: Ability to maintain clinical measurements within normal limits Outcome: Progressing Goal: Postoperative complications will be avoided or minimized Outcome: Progressing   Problem: Respiratory: Goal: Will regain and/or maintain adequate ventilation Outcome: Progressing Goal: Respiratory status will improve Outcome: Progressing   Problem: Skin Integrity: Goal: Demonstrates signs of wound healing without infection Outcome: Progressing   Problem: Urinary Elimination: Goal: Will remain free from infection Outcome: Progressing Goal: Ability to achieve and maintain adequate urine output Outcome: Progressing   Problem: Health Behavior/Discharge Planning: Goal: Ability to manage health-related needs will improve Outcome: Progressing   Problem: Activity: Goal: Risk for activity intolerance will decrease Outcome: Progressing   Problem: Coping: Goal: Level of anxiety will decrease Outcome: Progressing   Problem: Elimination: Goal: Will not experience complications related to bowel motility Outcome: Progressing Goal: Will not experience complications related to urinary retention Outcome: Progressing   Problem: Pain Managment: Goal: General experience of comfort will improve and/or be controlled Outcome: Progressing   Problem: Skin  Integrity: Goal: Risk for impaired skin integrity will decrease Outcome: Progressing   "

## 2024-10-10 NOTE — Discharge Instructions (Signed)

## 2024-10-10 NOTE — Anesthesia Procedure Notes (Signed)
 Procedure Name: Intubation Date/Time: 10/10/2024 8:55 AM  Performed by: Drew Lips, Corean BROCKS, CRNAPre-anesthesia Checklist: Patient identified, Emergency Drugs available, Suction available and Patient being monitored Patient Re-evaluated:Patient Re-evaluated prior to induction Oxygen Delivery Method: Circle system utilized Preoxygenation: Pre-oxygenation with 100% oxygen Induction Type: IV induction Ventilation: Mask ventilation without difficulty Laryngoscope Size: Mac and 3 Grade View: Grade I Tube type: Oral Number of attempts: 1 Airway Equipment and Method: Stylet and Oral airway Placement Confirmation: ETT inserted through vocal cords under direct vision, positive ETCO2 and breath sounds checked- equal and bilateral Secured at: 21 cm Tube secured with: Tape Dental Injury: Teeth and Oropharynx as per pre-operative assessment

## 2024-10-10 NOTE — Op Note (Unsigned)
 [name], [name] MEDICAL RECORD NO: [id number] ACCOUNT NO: [id number] DATE OF BIRTH: 06/25/1956 FACILITY: [facility] LOCATION: [facility] PHYSICIAN: Ricardo Likens, MD  Operative Report   DATE OF PROCEDURE: 10/10/2024  SURGEON:  Ricardo Likens, MD  PREOPERATIVE DIAGNOSIS:  High grade left ureteral stricture.  PROCEDURE PERFORMED: 1. Cystoscopy, left retrograde pyelogram, interpretation. 2. Insertion of left ureteral stent. 3. Robotic assisted laparoscopic left ureteroureterostomy with nephropexy.  ESTIMATED BLOOD LOSS:  Nil.  COMPLICATIONS:  None.  SPECIMENS:  Left ureteral segment with stricture and stone for permanent pathology.  FINDINGS: 1.  Persistent essential blind ending of the left ureter on retrograde pyelography. 2. Scarified segment of left ureter as anticipated at a position approximately 5 cm below the area of UPJ. 3. Excellent reapproximation of the ureter following segmental resection and nephropexy. 4. Successful placement of a left ureteral stent, proximal end in the renal pelvis, distal end in the urinary bladder.  INDICATIONS:  The patient is a pleasant 69 year old lady with a history of recurrent urolithiasis.  She was found last year to have a fairly large left UPJ stone.  She adamantly declined percutaneous management.  She therefore underwent staged  ureteroscopy by a colleague of mine as per her wishes.  Unfortunately, she developed a high grade left ureteral stricture following this that seemed to be associated with some stone material.  This was not amenable to endoscopic management, and a  diagnostic ureteroscopy and stent showed essentially blind ending of the ureter.  Options were discussed including recommended path of ureteral reconstruction in an effort to save her left kidney.  She presents for this today. Informed consent was  obtained and placed in the medical record.  DESCRIPTION OF PROCEDURE:  The patient being identified and verified.  Procedure  being cystoscopy, left retrograde stent placement, left ureteroureterostomy, possible nephropexy confirmed.  Procedure timeout was performed.  Intravenous antibiotics were  administered.  General endotracheal anesthesia was introduced.  The patient was placed into a low lithotomy position.  A sterile field was created, prepping and draping the patient's vagina and introitus and proximal thigh using iodine.   Cystourethroscopy was performed using a 21-French rigid cystoscope with offset lens.  Inspection of the urinary bladder revealed no diverticula, calcifications or papillary lesions.  The left ureteral orifice was cannulated with a 6-French end-hole  catheter and a left retrograde pyelogram was obtained.  The left retrograde pyelogram demonstrated a single left ureter with essentially blind ending at the level of the proximal ureter as per prior.  The open-ended catheter was advanced to this level.   A Foley catheter was placed per urethra to straight drain.  10 mL of sterile water  in the balloon. An open-ended catheter was fashioned to this using silk tie x2 and a primed 1-foot piece of IV extension tubing for later verification of the strictured  segment.  The patient was then repositioned into a left side up full flank position with applying 15 degrees of table flexion, superior arm elevated on an axillary roll, Sequential compression devices, bottom limb bent, top leg straight.  Bean bag was  deployed, she was further fastened to the operating table using 3-inch tape and foam padding across her supraxiphoid chest and her pelvis.  High-flow, low-pressure pneumoperitoneum was obtained using a Veress technique in the left upper quadrant, having  passed the aspiration and drop test after she was prepped with chlorhexidine  gluconate.  An 8-mm robotic camera port was then placed in a position approximately one-half handbreadths superior and lateral to  the umbilicus.  Laparoscopic examination of the   peritoneal cavity revealed no significant adhesions, no visceral injury.  Additional ports were placed as follows: a left subcostal 8-mm robotic port, a left far lateral 8-mm robotic port approximately 4 fingerbreadths superior medial to the anterior  superior iliac spine, a left paramedian inferior robotic port approximately one-half handbreadths superior to the pubic ramus, and two 12-mm assistant port sites in a paramedian location, one approximately 3 fingerbreadths inferior and medial to the  camera port, one approximately 3 fingerbreadths superior and medial to the camera port.  The superior one being an AirSeal type.  The robot was docked and passed the electronic checks.  Attention was directed at the development of the retroperitoneum.   An incision was made lateral to the descending colon from the area of the splenic flexure towards the area of the internal ring.  The colon was carefully swept medially, lateral splenic attachment was taken down allowing the spleen and pancreas to rotate  medially away from the anterior surface of Gerota's fascia.  The lower pole of the kidney was identified and placed in gentle lateral traction.  Dissection was proceeded medial to this.  The ureter and gonadal vessels were encountered.  The gonadal  vessels were swept laterally.  The ureter was allowed to fall medially.  There was significant desmoplasia around the ureter in this area with some dilation proximally.  This was marked with a vessel loop and then traced distally to the area of the  gonadal crossing.  The ureter became much more healthy appearing and normal caliber in the more mid to distal aspects.  The renal hilum was exposed just enough to visualize the artery and vein for safety.  The area of presumed stricture was noted grossly  as it was noted by a significant inflammatory response and change of ureteral caliber.  However, I wanted to actually verify this.  Therefore, approximately 4 mL of ICG dye  was injected via the open-ended catheter in this location and using Firefly  vision, this was corroborated.  The ureter was then transected in a spatulated fashion at the distal aspect which appeared to be healthy grossly and with ICG marking.  The ureter appeared to be grossly healthy approximately 2.5 cm proximal to this and it  was similarly transected.  There was some small stone debris in this, which was irrigated and set aside.  The intervening approximately 2.5 cm segment of stricture was taken out of continuity, set aside for pathologic analysis.  Given this ends looked  quite healthy with suitable vascularity for likely reconstruction.  Each end was spatulated approximately 1 cm in length.  There was some gap between this and it was felt that gentle nephropexy would be warranted to bring the kidney inferiorly.  As such,  some of the lateral and superior attachments of the kidney were taken down allowing the kidney to be further mobilized superiorly and inferiorly and two nephropexy sutures were applied between the psoas muscle and the lower pole perinephric tissue.   This anchoring it an additional approximately 3 cm inferiorly.  Following this, the two ends of the ureter could be reapproximated very easily without tension.  I was quite happy with this.  A heel stitch of 3-0 Monocryl was applied.  Next, a 6 x 24  Contour-type stent was carefully placed to the level of the bladder and to the level of the kidney over a wire using robotic assistance.  And further ureter-to-ureter anastomosis was  performed using two separate running suture lines of 3-0 Monocryl which  resulted in excellent tension-free apposition.  I was quite happy with the geometry and visible viability of this.  The retroperitoneum was reapproximated using running 3-0 V-Loc.  The robot was then undocked.  The two 12-mm port sites were closed at  the level of fascia using a Carter-Thomason suture passer and 0 Vicryl.  All incision  sites were infiltrated with dilute lipolyzed Marcaine  and closed at the level of the skin using subcuticular Monocryl followed by Dermabond.  Procedure was then  terminated.  The patient tolerated the procedure well with no immediate periprocedural complications.  The patient was taken to the postanesthesia care unit in stable condition.  Plan for admission.   PUS D: 10/10/2024 12:04:56 pm T: 10/10/2024 1:18:00 pm  JOB: [id number]/ [id number]

## 2024-10-10 NOTE — Anesthesia Postprocedure Evaluation (Signed)
"   Anesthesia Post Note  Patient: [name]  Procedure(s) Performed: URETEROURETEROSTOMY, ROBOT ASSISTED (Left) CYSTOSCOPY WITH INDOCYANINE GREEN  IMAGING (ICG) (Left) CYSTOSCOPY, WITH RETROGRADE PYELOGRAM (Left)     Patient location during evaluation: PACU Anesthesia Type: General Level of consciousness: oriented, patient cooperative and sedated Pain management: pain level controlled Vital Signs Assessment: post-procedure vital signs reviewed and stable Respiratory status: spontaneous breathing, nonlabored ventilation, respiratory function stable and patient connected to nasal cannula oxygen Cardiovascular status: blood pressure returned to baseline and stable Postop Assessment: no apparent nausea or vomiting Anesthetic complications: no   No notable events documented.  Last Vitals:  Vitals:   10/10/24 1330 10/10/24 1345  BP: 130/71 (!) 157/74  Pulse: 77 72  Resp: 10 10  Temp:    SpO2: 98% 100%    Last Pain:  Vitals:   10/10/24 1330  TempSrc:   PainSc: Asleep                 Facundo Allemand,E. Annastacia Duba      "

## 2024-10-11 ENCOUNTER — Encounter (HOSPITAL_COMMUNITY): Payer: Self-pay | Admitting: Urology

## 2024-10-11 LAB — BASIC METABOLIC PANEL WITH GFR
Anion gap: 11 (ref 5–15)
BUN: 19 mg/dL (ref 8–23)
CO2: 22 mmol/L (ref 22–32)
Calcium: 8.7 mg/dL — ABNORMAL LOW (ref 8.9–10.3)
Chloride: 104 mmol/L (ref 98–111)
Creatinine, Ser: 1.1 mg/dL — ABNORMAL HIGH (ref 0.44–1.00)
GFR, Estimated: 54 mL/min — ABNORMAL LOW
Glucose, Bld: 93 mg/dL (ref 70–99)
Potassium: 3.8 mmol/L (ref 3.5–5.1)
Sodium: 137 mmol/L (ref 135–145)

## 2024-10-11 LAB — CBC
HCT: 33.1 % — ABNORMAL LOW (ref 36.0–46.0)
Hemoglobin: 11.1 g/dL — ABNORMAL LOW (ref 12.0–15.0)
MCH: 33.6 pg (ref 26.0–34.0)
MCHC: 33.5 g/dL (ref 30.0–36.0)
MCV: 100.3 fL — ABNORMAL HIGH (ref 80.0–100.0)
Platelets: 190 K/uL (ref 150–400)
RBC: 3.3 MIL/uL — ABNORMAL LOW (ref 3.87–5.11)
RDW: 12.8 % (ref 11.5–15.5)
WBC: 8.6 K/uL (ref 4.0–10.5)
nRBC: 0 % (ref 0.0–0.2)

## 2024-10-11 MED ORDER — ORAL CARE MOUTH RINSE: 15.0000 mL | OROMUCOSAL | Status: DC | PRN

## 2024-10-11 MED ORDER — OXYBUTYNIN CHLORIDE 5 MG PO TABS
5.0000 mg | ORAL_TABLET | Freq: Three times a day (TID) | ORAL | Status: DC | PRN
  Administered 2024-10-11 – 2024-10-12 (×2): 5 mg via ORAL
  Filled 2024-10-11 (×2): qty 1

## 2024-10-11 NOTE — Progress Notes (Signed)
 Pt was able to walk from her room to the charge station and back to her room. Foley was removed per order

## 2024-10-11 NOTE — Progress Notes (Signed)
 1 Day Post-Op  Subjective: Mackinley had shoulder pain over night but that has resolved.  She had some nausea when getting up to walk with an empty stomach but she is hungry and would like more than clears.  She has no significant surgical site pain.   The foley was removed this morning.  She has moderate JP output.  Cr is 1.1 and Hgb is stable.    ROS:  Review of Systems  All other systems reviewed and are negative.   Anti-infectives: Anti-infectives (From admission, onward)    Start     Dose/Rate Route Frequency Ordered Stop   10/10/24 0704  ceFAZolin  (ANCEF ) IVPB 2g/100 mL premix        2 g 200 mL/hr over 30 Minutes Intravenous 30 min pre-op 10/10/24 9295 10/10/24 0902       Current Facility-Administered Medications  Medication Dose Route Frequency Provider Last Rate Last Admin   0.9 %  sodium chloride  infusion   Intravenous Continuous Trivedi, Hersh M, MD 100 mL/hr at 10/11/24 0425 New Bag at 10/11/24 0425   acetaminophen  (TYLENOL ) tablet 1,000 mg  1,000 mg Oral Q6H Trivedi, Hersh M, MD   1,000 mg at 10/11/24 0503   azaTHIOprine  (IMURAN ) tablet 50 mg  50 mg Oral Daily Trivedi, Hersh M, MD       carvedilol  (COREG ) tablet 6.25 mg  6.25 mg Oral BID WC Trivedi, Hersh M, MD   6.25 mg at 10/10/24 1730   Chlorhexidine  Gluconate Cloth 2 % PADS 6 each  6 each Topical Daily Trivedi, Hersh M, MD   6 each at 10/10/24 1730   gabapentin  (NEURONTIN ) capsule 300 mg  300 mg Oral Daily Trivedi, Hersh M, MD       gabapentin  (NEURONTIN ) capsule 600 mg  600 mg Oral QHS Manny, Theodore B Jr., MD   600 mg at 10/10/24 2209   melatonin tablet 10 mg  10 mg Oral QHS Trivedi, Hersh M, MD   10 mg at 10/10/24 2210   ondansetron  (ZOFRAN ) injection 4 mg  4 mg Intravenous Q4H PRN Jesusa Jacqulyn HERO, MD   4 mg at 10/10/24 1743   Oral care mouth rinse  15 mL Mouth Rinse PRN Alvaro Ricardo KATHEE Mickey., MD       oxyCODONE  (Oxy IR/ROXICODONE ) immediate release tablet 5 mg  5 mg Oral Q4H PRN Trivedi, Hersh M, MD   5 mg at  10/10/24 2003   pantoprazole  (PROTONIX ) EC tablet 40 mg  40 mg Oral Daily Trivedi, Hersh M, MD       polyethylene glycol (MIRALAX  / GLYCOLAX ) packet 17 g  17 g Oral Daily PRN Trivedi, Hersh M, MD       rOPINIRole  (REQUIP ) tablet 0.5 mg  0.5 mg Oral QHS Trivedi, Hersh M, MD   0.5 mg at 10/10/24 2210   tamsulosin  (FLOMAX ) capsule 0.4 mg  0.4 mg Oral Daily Trivedi, Hersh M, MD   0.4 mg at 10/10/24 1730     Objective: Vital signs in last 24 hours: Temp:  [96.9 F (36.1 C)-99.3 F (37.4 C)] 98 F (36.7 C) (01/17 0424) Pulse Rate:  [70-91] 73 (01/17 0424) Resp:  [9-21] 17 (01/17 0424) BP: (100-167)/(47-93) 111/54 (01/17 0424) SpO2:  [90 %-100 %] 97 % (01/17 0424) Weight:  [72.4 kg] 72.4 kg (01/16 1623)  Intake/Output from previous day: 01/16 0701 - 01/17 0700 In: 2800.5 [I.V.:2700.5; IV Piggyback:100] Out: 1150 [Urine:1050; Drains:90; Blood:10] Intake/Output this shift: No intake/output data recorded.   Physical Exam Vitals reviewed.  Constitutional:      Appearance: Normal appearance.  Cardiovascular:     Rate and Rhythm: Normal rate.  Pulmonary:     Effort: Pulmonary effort is normal. No respiratory distress.  Abdominal:     Comments: Wounds intact.  JP with serosanguinous drainage.   Musculoskeletal:        General: Normal range of motion.  Skin:    General: Skin is warm and dry.  Neurological:     General: No focal deficit present.     Mental Status: She is alert and oriented to person, place, and time.     Lab Results:  Recent Labs    10/10/24 1833 10/11/24 0531  WBC  --  8.6  HGB 12.7 11.1*  HCT 37.7 33.1*  PLT  --  190   BMET Recent Labs    10/10/24 1442 10/11/24 0531  NA 140 137  K 4.8 3.8  CL 104 104  CO2 28 22  GLUCOSE 125* 93  BUN 23 19  CREATININE 1.16* 1.10*  CALCIUM  9.3 8.7*   PT/INR No results for input(s): LABPROT, INR in the last 72 hours. ABG No results for input(s): PHART, HCO3 in the last 72 hours.  Invalid input(s):  PCO2, PO2  Studies/Results: DG C-Arm 1-60 Min-No Report Result Date: 10/10/2024 Fluoroscopy was utilized by the requesting physician.  No radiographic interpretation.     Assessment and Plan: Doing well s/p left UU.   Advance diet and check JP Cr.       LOS: 1 day    Norleen Seltzer 10/11/2024

## 2024-10-12 LAB — CBC
HCT: 31 % — ABNORMAL LOW (ref 36.0–46.0)
Hemoglobin: 10.3 g/dL — ABNORMAL LOW (ref 12.0–15.0)
MCH: 33.3 pg (ref 26.0–34.0)
MCHC: 33.2 g/dL (ref 30.0–36.0)
MCV: 100.3 fL — ABNORMAL HIGH (ref 80.0–100.0)
Platelets: 184 K/uL (ref 150–400)
RBC: 3.09 MIL/uL — ABNORMAL LOW (ref 3.87–5.11)
RDW: 13 % (ref 11.5–15.5)
WBC: 7.9 K/uL (ref 4.0–10.5)
nRBC: 0 % (ref 0.0–0.2)

## 2024-10-12 LAB — BASIC METABOLIC PANEL WITH GFR
Anion gap: 6 (ref 5–15)
BUN: 18 mg/dL (ref 8–23)
CO2: 26 mmol/L (ref 22–32)
Calcium: 8.8 mg/dL — ABNORMAL LOW (ref 8.9–10.3)
Chloride: 107 mmol/L (ref 98–111)
Creatinine, Ser: 1.11 mg/dL — ABNORMAL HIGH (ref 0.44–1.00)
GFR, Estimated: 53 mL/min — ABNORMAL LOW
Glucose, Bld: 115 mg/dL — ABNORMAL HIGH (ref 70–99)
Potassium: 3.8 mmol/L (ref 3.5–5.1)
Sodium: 138 mmol/L (ref 135–145)

## 2024-10-12 LAB — HIV ANTIBODY (ROUTINE TESTING W REFLEX): HIV Screen 4th Generation wRfx: NONREACTIVE

## 2024-10-12 NOTE — Discharge Summary (Signed)
 Physician Discharge Summary  Patient ID: [name] MRN: [id number] DOB/AGE: 12-02-1955 69 y.o.  Admit date: 10/10/2024 Discharge date: 10/12/2024  Admission Diagnoses:  Ureteral stricture  Discharge Diagnoses:  Principal Problem:   Ureteral stricture   Past Medical History:  Diagnosis Date   Anemia    Anxiety    Arthritis    Carpal tunnel syndrome    Right hand   Coronary artery disease    Deviated septum    Dyspnea    with exertion   wheezing at night   Fibroid tumor    GERD (gastroesophageal reflux disease) 11/02/2015   Hepatitis    AS CHILD  , 4th grade   History of kidney stones    Hypercholesteremia 07/21/2015   Hypertension 07/10/2015   Macular degeneration    Right   Memory loss    Meniscus tear    Numbness in feet    Optic neuritis    Right   Pneumonia    Walking   PONV (postoperative nausea and vomiting)    Restless leg syndrome 09/01/2015   Retinal detachment 03/06/2001   Spinal stenosis    Urinary frequency    Vertigo     Surgeries: Procedures: URETEROURETEROSTOMY, ROBOT ASSISTED CYSTOSCOPY WITH INDOCYANINE GREEN  IMAGING (ICG) CYSTOSCOPY, WITH RETROGRADE PYELOGRAM on 10/10/2024   Consultants (if any):   Discharged Condition: Improved  Hospital Course: [name] is an 69 y.o. female who was admitted 10/10/2024 with a diagnosis of Ureteral stricture and went to the operating room on 10/10/2024 and underwent the above named procedures.  She has been voiding well since the foley was removed.  Her JP drainage is minimal but slightly cream suggestive of lymph but with the small amount the drain was removed.     She was given perioperative antibiotics:  Anti-infectives (From admission, onward)    Start     Dose/Rate Route Frequency Ordered Stop   10/10/24 0704  ceFAZolin  (ANCEF ) IVPB 2g/100 mL premix        2 g 200 mL/hr over 30 Minutes Intravenous 30 min pre-op 10/10/24 0704 10/10/24 0902     .  She was given sequential compression  devices for DVT prophylaxis.  She benefited maximally from the hospital stay and there were no complications.    Inpatient Morphine  Milligram Equivalents Per Day 1/16 - 1/18   Values displayed are in units of MME/Day    Order Start / End Date 1/16 Yesterday Today    oxyCODONE  (Oxy IR/ROXICODONE ) immediate release tablet 5 mg 1/16 - 1/16 0 of Unknown -- --    oxyCODONE  (ROXICODONE ) 5 MG/5ML solution 5 mg 1/16 - 1/16 0 of Unknown -- --      Group total: 0 of Unknown      HYDROmorphone  (DILAUDID ) injection 0.25-0.5 mg 1/16 - 1/16 40 of 40-80 -- --    fentaNYL  (SUBLIMAZE ) injection 1/16 - 1/16 *60 of 60 -- --    oxyCODONE  (Oxy IR/ROXICODONE ) immediate release tablet 5 mg 1/16 - No end date 7.5 of 15 0 of 45 0 of 45    HYDROmorphone  (DILAUDID ) injection 0.25-0.5 mg 1/16 - 1/16 0 of 5-10 -- --    HYDROmorphone  (DILAUDID ) injection 0.25-0.5 mg 1/16 - 1/16 10 of 40-80 -- --    Daily Totals  * 117.5 of Unknown (at least 160-245) 0 of 45 0 of 45  *One-Step medication  Calculation Errors     Order Type Date Details   oxyCODONE  (Oxy IR/ROXICODONE ) immediate release tablet 5 mg  Ordered Dose -- Insufficient frequency information   oxyCODONE  (ROXICODONE ) 5 MG/5ML solution 5 mg Ordered Dose -- Insufficient frequency information            Recent vital signs:  Vitals:   10/12/24 0027 10/12/24 0535  BP: (!) 110/45 118/65  Pulse: 66 70  Resp:  18  Temp:  97.8 F (36.6 C)  SpO2: 97% 96%   Gen: WD, WN in NAD Lungs: CTA CV: RRR GI: soft with intact incisions and left flank JP.   Recent laboratory studies:  Lab Results  Component Value Date   HGB 10.3 (L) 10/12/2024   HGB 11.1 (L) 10/11/2024   HGB 12.7 10/10/2024   Lab Results  Component Value Date   WBC 7.9 10/12/2024   PLT 184 10/12/2024   Lab Results  Component Value Date   INR 1.01 10/29/2017   Lab Results  Component Value Date   NA 138 10/12/2024   K 3.8 10/12/2024   CL 107 10/12/2024   CO2 26 10/12/2024   BUN 18  10/12/2024   CREATININE 1.11 (H) 10/12/2024   GLUCOSE 115 (H) 10/12/2024    Discharge Medications:   Allergies as of 10/12/2024       Reactions   Ezetimibe Other (See Comments)   Myalgia   Prednisone  Anxiety, Other (See Comments)   Sleep disturbances, Agitation, Confusion   Rosuvastatin Other (See Comments)   Myalgia        Medication List     TAKE these medications    rOPINIRole  0.5 MG tablet Commonly known as: REQUIP  Take 0.5 mg by mouth at bedtime. The timing of this medication is very important.   aspirin  EC 81 MG tablet Take 81 mg by mouth in the morning. Swallow whole.   azaTHIOprine  50 MG tablet Commonly known as: IMURAN  Take 1 tablet (50 mg total) by mouth daily.   carvedilol  12.5 MG tablet Commonly known as: COREG  Take 6.25 mg by mouth 2 (two) times daily with a meal.   Centrum Silver Women 50+ Tabs Take 1 tablet by mouth daily.   chlorthalidone  25 MG tablet Commonly known as: HYGROTON  Take 25 mg by mouth every Monday, Wednesday, and Friday. In the morning.   cholecalciferol  25 MCG (1000 UNIT) tablet Commonly known as: VITAMIN D3 Take 1,000 Units by mouth in the morning.   cyanocobalamin 1000 MCG tablet Commonly known as: VITAMIN B12 Take 1,000 mcg by mouth at bedtime.   docusate sodium  100 MG capsule Commonly known as: COLACE Take 100 mg by mouth at bedtime.   gabapentin  300 MG capsule Commonly known as: NEURONTIN  Take 300-600 mg by mouth See admin instructions. Take 1 capsule (300 mg) by mouth every morning & take 2 capsules (600 mg) by mouth every night.   melatonin 5 MG Tabs Take 10 mg by mouth at bedtime.   omeprazole 20 MG capsule Commonly known as: PRILOSEC Take 20 mg by mouth daily as needed (Heartburn).   OVER THE COUNTER MEDICATION Take 1 tablet by mouth at bedtime. Opti Gold   oxyCODONE -acetaminophen  5-325 MG tablet Commonly known as: Percocet Take 1 tablet by mouth every 6 (six) hours as needed for moderate pain (pain  score 4-6) or severe pain (pain score 7-10). Post-operatively.   polyethylene glycol powder 17 GM/SCOOP powder Commonly known as: GLYCOLAX /MIRALAX  Take 17 g by mouth daily as needed (constipation.).   psyllium 0.52 g capsule Commonly known as: REGULOID Take 0.52 g by mouth daily.   senna-docusate 8.6-50 MG tablet Commonly known as:  Senokot-S Take 1 tablet by mouth 2 (two) times daily. While taking strong pain meds to prevent constipation What changed: Another medication with the same name was added. Make sure you understand how and when to take each.   senna-docusate 8.6-50 MG tablet Commonly known as: Senokot-S Take 1 tablet by mouth 2 (two) times daily. While taking strong pain meds to prevent constipation. What changed: You were already taking a medication with the same name, and this prescription was added. Make sure you understand how and when to take each.   VABYSMO  IZ 1 Dose by Intravitreal route every 6 (six) weeks. Right eye   VICTORION-1 PREVENT inclisiran or placebo 300 mg/1.5 mL SQ injection Inject 300 mg into the skin every 6 (six) months. For Investigational Use Only. Inject subcutaneously into the abdomen, upper arm or thigh every 6 months. Do not inject into areas of active skin disease,such as sun burns, skin rashes, inflammation, or skin infections.Please contact [facility] for any questions or concerns.        Diagnostic Studies: DG C-Arm 1-60 Min-No Report Result Date: 10/10/2024 Fluoroscopy was utilized by the requesting physician.  No radiographic interpretation.    Disposition: Discharge disposition: 01-Home or Self Care       Discharge Instructions     Discontinue IV   Complete by: As directed         Follow-up Information     Alvaro Ricardo KATHEE Raddle., MD Follow up on 10/23/2024.   Specialty: Urology Why: at 9:15 for MD visit Contact information: [address] [place] [address] [phone]                   Signed: Norleen Seltzer 10/12/2024, 9:55 AM

## 2024-10-13 LAB — SURGICAL PATHOLOGY

## 2024-10-14 ENCOUNTER — Emergency Department (HOSPITAL_COMMUNITY)

## 2024-10-14 ENCOUNTER — Emergency Department (HOSPITAL_COMMUNITY): Admitting: Certified Registered Nurse Anesthetist

## 2024-10-14 ENCOUNTER — Inpatient Hospital Stay (HOSPITAL_COMMUNITY)
Admission: EM | Admit: 2024-10-14 | Discharge: 2024-10-17 | DRG: 355 | Disposition: A | Discharge status: Home / Self Care | Attending: Urology | Admitting: Urology

## 2024-10-14 ENCOUNTER — Encounter (HOSPITAL_COMMUNITY)
Admission: EM | Disposition: A | Discharge status: Home / Self Care | Payer: Self-pay | Source: Home / Self Care | Attending: Urology

## 2024-10-14 ENCOUNTER — Other Ambulatory Visit: Payer: Self-pay

## 2024-10-14 ENCOUNTER — Encounter (HOSPITAL_COMMUNITY): Payer: Self-pay | Admitting: Anesthesiology

## 2024-10-14 DIAGNOSIS — Z96651 Presence of right artificial knee joint: Secondary | ICD-10-CM | POA: Diagnosis present

## 2024-10-14 DIAGNOSIS — H3530 Unspecified macular degeneration: Secondary | ICD-10-CM | POA: Diagnosis present

## 2024-10-14 DIAGNOSIS — M1990 Unspecified osteoarthritis, unspecified site: Secondary | ICD-10-CM | POA: Diagnosis present

## 2024-10-14 DIAGNOSIS — Z888 Allergy status to other drugs, medicaments and biological substances status: Secondary | ICD-10-CM | POA: Diagnosis not present

## 2024-10-14 DIAGNOSIS — K219 Gastro-esophageal reflux disease without esophagitis: Secondary | ICD-10-CM | POA: Diagnosis present

## 2024-10-14 DIAGNOSIS — I2510 Atherosclerotic heart disease of native coronary artery without angina pectoris: Secondary | ICD-10-CM | POA: Diagnosis present

## 2024-10-14 DIAGNOSIS — Z8249 Family history of ischemic heart disease and other diseases of the circulatory system: Secondary | ICD-10-CM | POA: Diagnosis not present

## 2024-10-14 DIAGNOSIS — E7800 Pure hypercholesterolemia, unspecified: Secondary | ICD-10-CM | POA: Diagnosis present

## 2024-10-14 DIAGNOSIS — I10 Essential (primary) hypertension: Secondary | ICD-10-CM | POA: Diagnosis present

## 2024-10-14 DIAGNOSIS — Z7982 Long term (current) use of aspirin: Secondary | ICD-10-CM

## 2024-10-14 DIAGNOSIS — G2581 Restless legs syndrome: Secondary | ICD-10-CM | POA: Diagnosis present

## 2024-10-14 DIAGNOSIS — Z9842 Cataract extraction status, left eye: Secondary | ICD-10-CM

## 2024-10-14 DIAGNOSIS — F419 Anxiety disorder, unspecified: Secondary | ICD-10-CM | POA: Diagnosis present

## 2024-10-14 DIAGNOSIS — Z8052 Family history of malignant neoplasm of bladder: Secondary | ICD-10-CM

## 2024-10-14 DIAGNOSIS — N135 Crossing vessel and stricture of ureter without hydronephrosis: Secondary | ICD-10-CM | POA: Diagnosis present

## 2024-10-14 DIAGNOSIS — Z9841 Cataract extraction status, right eye: Secondary | ICD-10-CM | POA: Diagnosis not present

## 2024-10-14 DIAGNOSIS — Z79899 Other long term (current) drug therapy: Secondary | ICD-10-CM

## 2024-10-14 DIAGNOSIS — E785 Hyperlipidemia, unspecified: Secondary | ICD-10-CM | POA: Diagnosis present

## 2024-10-14 DIAGNOSIS — Z79624 Long term (current) use of inhibitors of nucleotide synthesis: Secondary | ICD-10-CM

## 2024-10-14 DIAGNOSIS — Z823 Family history of stroke: Secondary | ICD-10-CM

## 2024-10-14 DIAGNOSIS — N3289 Other specified disorders of bladder: Secondary | ICD-10-CM | POA: Diagnosis present

## 2024-10-14 DIAGNOSIS — K56609 Unspecified intestinal obstruction, unspecified as to partial versus complete obstruction: Principal | ICD-10-CM | POA: Diagnosis present

## 2024-10-14 DIAGNOSIS — Z9889 Other specified postprocedural states: Secondary | ICD-10-CM

## 2024-10-14 DIAGNOSIS — Z8349 Family history of other endocrine, nutritional and metabolic diseases: Secondary | ICD-10-CM

## 2024-10-14 DIAGNOSIS — Z87442 Personal history of urinary calculi: Secondary | ICD-10-CM | POA: Diagnosis not present

## 2024-10-14 DIAGNOSIS — M79652 Pain in left thigh: Secondary | ICD-10-CM | POA: Diagnosis present

## 2024-10-14 DIAGNOSIS — Z981 Arthrodesis status: Secondary | ICD-10-CM

## 2024-10-14 DIAGNOSIS — K430 Incisional hernia with obstruction, without gangrene: Secondary | ICD-10-CM | POA: Diagnosis present

## 2024-10-14 HISTORY — PX: INGUINAL HERNIA REPAIR: SHX194

## 2024-10-14 LAB — COMPREHENSIVE METABOLIC PANEL WITH GFR
ALT: 15 U/L (ref 0–44)
AST: 35 U/L (ref 15–41)
Albumin: 3.5 g/dL (ref 3.5–5.0)
Alkaline Phosphatase: 65 U/L (ref 38–126)
Anion gap: 8 (ref 5–15)
BUN: 16 mg/dL (ref 8–23)
CO2: 33 mmol/L — ABNORMAL HIGH (ref 22–32)
Calcium: 9.7 mg/dL (ref 8.9–10.3)
Chloride: 98 mmol/L (ref 98–111)
Creatinine, Ser: 0.94 mg/dL (ref 0.44–1.00)
GFR, Estimated: >60 mL/min
Glucose, Bld: 138 mg/dL — ABNORMAL HIGH (ref 70–99)
Potassium: 3.6 mmol/L (ref 3.5–5.1)
Sodium: 138 mmol/L (ref 135–145)
Total Bilirubin: 0.6 mg/dL (ref 0.0–1.2)
Total Protein: 6.2 g/dL — ABNORMAL LOW (ref 6.5–8.1)

## 2024-10-14 LAB — CBC
HCT: 36.4 % (ref 36.0–46.0)
Hemoglobin: 12.3 g/dL (ref 12.0–15.0)
MCH: 33.1 pg (ref 26.0–34.0)
MCHC: 33.8 g/dL (ref 30.0–36.0)
MCV: 97.8 fL (ref 80.0–100.0)
Platelets: 244 K/uL (ref 150–400)
RBC: 3.72 MIL/uL — ABNORMAL LOW (ref 3.87–5.11)
RDW: 12.5 % (ref 11.5–15.5)
WBC: 9.8 K/uL (ref 4.0–10.5)
nRBC: 0 % (ref 0.0–0.2)

## 2024-10-14 LAB — URINALYSIS, W/ REFLEX TO CULTURE (INFECTION SUSPECTED)
Bacteria, UA: NONE SEEN
Bilirubin Urine: NEGATIVE
Glucose, UA: NEGATIVE mg/dL
Hgb urine dipstick: NEGATIVE
Ketones, ur: 5 mg/dL — AB
Leukocytes,Ua: NEGATIVE
Nitrite: NEGATIVE
Protein, ur: 30 mg/dL — AB
Specific Gravity, Urine: 1.015 (ref 1.005–1.030)
pH: 7 (ref 5.0–8.0)

## 2024-10-14 LAB — LIPASE, BLOOD: Lipase: 15 U/L (ref 11–51)

## 2024-10-14 MED ORDER — GABAPENTIN 300 MG PO CAPS: 300.0000 mg | ORAL_CAPSULE | Freq: Every day | ORAL | Status: DC

## 2024-10-14 MED ORDER — FAMOTIDINE IN NACL 20-0.9 MG/50ML-% IV SOLN
20.0000 mg | INTRAVENOUS | Status: DC
  Administered 2024-10-14 – 2024-10-16 (×3): 20 mg via INTRAVENOUS
  Filled 2024-10-14 (×3): qty 50

## 2024-10-14 MED ORDER — FENTANYL CITRATE (PF) 100 MCG/2ML IJ SOLN
INTRAMUSCULAR | Status: AC
  Filled 2024-10-14: qty 2

## 2024-10-14 MED ORDER — PROPOFOL 10 MG/ML IV BOLUS
INTRAVENOUS | Status: DC | PRN
  Administered 2024-10-14: 100 mg via INTRAVENOUS

## 2024-10-14 MED ORDER — SUGAMMADEX SODIUM 200 MG/2ML IV SOLN
INTRAVENOUS | Status: DC | PRN
  Administered 2024-10-14: 144.8 mg via INTRAVENOUS

## 2024-10-14 MED ORDER — GABAPENTIN 250 MG/5ML PO SOLN
300.0000 mg | Freq: Every day | ORAL | Status: DC
  Administered 2024-10-15: 300 mg
  Filled 2024-10-14: qty 6

## 2024-10-14 MED ORDER — GABAPENTIN 250 MG/5ML PO SOLN
600.0000 mg | Freq: Every day | ORAL | Status: DC
  Filled 2024-10-14: qty 12

## 2024-10-14 MED ORDER — CHLORTHALIDONE 25 MG PO TABS
25.0000 mg | ORAL_TABLET | ORAL | Status: DC
  Filled 2024-10-14: qty 1

## 2024-10-14 MED ORDER — SODIUM CHLORIDE 0.9 % IV SOLN: INTRAVENOUS | Status: AC

## 2024-10-14 MED ORDER — METOCLOPRAMIDE HCL 5 MG/ML IJ SOLN
10.0000 mg | Freq: Once | INTRAMUSCULAR | Status: AC
  Administered 2024-10-14: 10 mg via INTRAVENOUS
  Filled 2024-10-14: qty 2

## 2024-10-14 MED ORDER — PHENYLEPHRINE HCL-NACL 20-0.9 MG/250ML-% IV SOLN
INTRAVENOUS | Status: DC | PRN
  Administered 2024-10-14: 50 ug/min via INTRAVENOUS

## 2024-10-14 MED ORDER — ACETAMINOPHEN 500 MG PO TABS
1000.0000 mg | ORAL_TABLET | Freq: Four times a day (QID) | ORAL | Status: AC
  Administered 2024-10-14 – 2024-10-15 (×3): 1000 mg via NASOGASTRIC
  Filled 2024-10-14 (×4): qty 2

## 2024-10-14 MED ORDER — ROCURONIUM BROMIDE 10 MG/ML (PF) SYRINGE
PREFILLED_SYRINGE | INTRAVENOUS | Status: DC | PRN
  Administered 2024-10-14: 50 mg via INTRAVENOUS

## 2024-10-14 MED ORDER — ONDANSETRON HCL 4 MG/2ML IJ SOLN
4.0000 mg | Freq: Once | INTRAMUSCULAR | Status: AC
  Administered 2024-10-14: 4 mg via INTRAVENOUS
  Filled 2024-10-14: qty 2

## 2024-10-14 MED ORDER — PHENYLEPHRINE 80 MCG/ML (10ML) SYRINGE FOR IV PUSH (FOR BLOOD PRESSURE SUPPORT)
PREFILLED_SYRINGE | INTRAVENOUS | Status: DC | PRN
  Administered 2024-10-14 (×3): 160 ug via INTRAVENOUS

## 2024-10-14 MED ORDER — ONDANSETRON HCL 4 MG/2ML IJ SOLN: 4.0000 mg | INTRAMUSCULAR | Status: DC | PRN

## 2024-10-14 MED ORDER — FENTANYL CITRATE (PF) 50 MCG/ML IJ SOSY
25.0000 ug | PREFILLED_SYRINGE | Freq: Once | INTRAMUSCULAR | Status: AC
  Administered 2024-10-14: 25 ug via INTRAVENOUS
  Filled 2024-10-14: qty 1

## 2024-10-14 MED ORDER — POLYETHYLENE GLYCOL 3350 17 G PO PACK: 17.0000 g | PACK | Freq: Every day | ORAL | Status: DC | PRN

## 2024-10-14 MED ORDER — CHLORHEXIDINE GLUCONATE 0.12 % MT SOLN: 15.0000 mL | Freq: Once | OROMUCOSAL | Status: AC

## 2024-10-14 MED ORDER — FENTANYL CITRATE (PF) 50 MCG/ML IJ SOSY
PREFILLED_SYRINGE | INTRAMUSCULAR | Status: AC
  Administered 2024-10-14: 50 ug via INTRAVENOUS
  Filled 2024-10-14: qty 1

## 2024-10-14 MED ORDER — FENTANYL CITRATE (PF) 50 MCG/ML IJ SOSY: 50.0000 ug | PREFILLED_SYRINGE | INTRAMUSCULAR | Status: AC

## 2024-10-14 MED ORDER — ONDANSETRON HCL 4 MG/2ML IJ SOLN: 4.0000 mg | Freq: Once | INTRAMUSCULAR | Status: DC | PRN

## 2024-10-14 MED ORDER — HYDROMORPHONE HCL 1 MG/ML IJ SOLN
0.2500 mg | INTRAMUSCULAR | Status: DC | PRN
  Administered 2024-10-14: 0.25 mg via INTRAVENOUS

## 2024-10-14 MED ORDER — BUPIVACAINE-EPINEPHRINE 0.25% -1:200000 IJ SOLN
INTRAMUSCULAR | Status: DC | PRN
  Administered 2024-10-14: 20 mL

## 2024-10-14 MED ORDER — PIPERACILLIN-TAZOBACTAM 3.375 G IVPB 30 MIN
3.3750 g | Freq: Once | INTRAVENOUS | Status: AC
  Administered 2024-10-14: 3.375 g via INTRAVENOUS
  Filled 2024-10-14: qty 50

## 2024-10-14 MED ORDER — PROPOFOL 10 MG/ML IV BOLUS
INTRAVENOUS | Status: AC
  Filled 2024-10-14: qty 20

## 2024-10-14 MED ORDER — HYDROMORPHONE HCL 1 MG/ML IJ SOLN
INTRAMUSCULAR | Status: AC
  Filled 2024-10-14: qty 1

## 2024-10-14 MED ORDER — GABAPENTIN 300 MG PO CAPS: 300.0000 mg | ORAL_CAPSULE | ORAL | Status: DC

## 2024-10-14 MED ORDER — FENTANYL CITRATE (PF) 250 MCG/5ML IJ SOLN
INTRAMUSCULAR | Status: DC | PRN
  Administered 2024-10-14 (×2): 50 ug via INTRAVENOUS

## 2024-10-14 MED ORDER — OXYCODONE HCL 5 MG/5ML PO SOLN: 5.0000 mg | Freq: Once | ORAL | Status: DC | PRN

## 2024-10-14 MED ORDER — BUPIVACAINE-EPINEPHRINE (PF) 0.25% -1:200000 IJ SOLN
INTRAMUSCULAR | Status: AC
  Filled 2024-10-14: qty 30

## 2024-10-14 MED ORDER — HYDROMORPHONE HCL 1 MG/ML IJ SOLN
0.2500 mg | Freq: Three times a day (TID) | INTRAMUSCULAR | Status: DC | PRN
  Administered 2024-10-14 – 2024-10-15 (×2): 0.5 mg via INTRAVENOUS
  Filled 2024-10-14 (×2): qty 0.5

## 2024-10-14 MED ORDER — DEXAMETHASONE SOD PHOSPHATE PF 10 MG/ML IJ SOLN
INTRAMUSCULAR | Status: DC | PRN
  Administered 2024-10-14: 10 mg via INTRAVENOUS

## 2024-10-14 MED ORDER — SODIUM CHLORIDE 0.9 % IV BOLUS
500.0000 mL | Freq: Once | INTRAVENOUS | Status: AC
  Administered 2024-10-14: 500 mL via INTRAVENOUS

## 2024-10-14 MED ORDER — CARVEDILOL 6.25 MG PO TABS
6.2500 mg | ORAL_TABLET | Freq: Two times a day (BID) | ORAL | Status: DC
  Administered 2024-10-15 (×2): 6.25 mg via NASOGASTRIC
  Filled 2024-10-14 (×2): qty 1

## 2024-10-14 MED ORDER — LACTATED RINGERS IV SOLN: INTRAVENOUS | Status: DC

## 2024-10-14 MED ORDER — AMISULPRIDE (ANTIEMETIC) 5 MG/2ML IV SOLN: 10.0000 mg | Freq: Once | INTRAVENOUS | Status: DC | PRN

## 2024-10-14 MED ORDER — SENNOSIDES-DOCUSATE SODIUM 8.6-50 MG PO TABS
2.0000 | ORAL_TABLET | Freq: Every day | ORAL | Status: DC
  Administered 2024-10-14: 2 via NASOGASTRIC
  Filled 2024-10-14: qty 2

## 2024-10-14 MED ORDER — FENTANYL CITRATE (PF) 50 MCG/ML IJ SOSY
PREFILLED_SYRINGE | INTRAMUSCULAR | Status: AC
  Filled 2024-10-14: qty 1

## 2024-10-14 MED ORDER — OXYCODONE HCL 5 MG PO TABS: 5.0000 mg | ORAL_TABLET | Freq: Once | ORAL | Status: DC | PRN

## 2024-10-14 MED ORDER — ALBUMIN HUMAN 5 % IV SOLN: INTRAVENOUS | Status: DC | PRN

## 2024-10-14 MED ORDER — ONDANSETRON HCL 4 MG/2ML IJ SOLN
INTRAMUSCULAR | Status: DC | PRN
  Administered 2024-10-14: 4 mg via INTRAVENOUS

## 2024-10-14 MED ORDER — MIDAZOLAM HCL (PF) 2 MG/2ML IJ SOLN
INTRAMUSCULAR | Status: DC | PRN
  Administered 2024-10-14: 2 mg via INTRAVENOUS

## 2024-10-14 MED ORDER — MELATONIN 5 MG PO TABS
10.0000 mg | ORAL_TABLET | Freq: Every day | ORAL | Status: DC
  Administered 2024-10-14: 10 mg
  Filled 2024-10-14: qty 2

## 2024-10-14 MED ORDER — MIDAZOLAM HCL 2 MG/2ML IJ SOLN
INTRAMUSCULAR | Status: AC
  Filled 2024-10-14: qty 2

## 2024-10-14 MED ORDER — GABAPENTIN 300 MG PO CAPS
600.0000 mg | ORAL_CAPSULE | Freq: Every day | ORAL | Status: DC
  Administered 2024-10-14: 600 mg via ORAL
  Filled 2024-10-14: qty 2

## 2024-10-14 MED ORDER — ROPINIROLE HCL 0.5 MG PO TABS
0.5000 mg | ORAL_TABLET | Freq: Every day | ORAL | Status: DC
  Administered 2024-10-14: 0.5 mg via NASOGASTRIC
  Filled 2024-10-14 (×2): qty 1
  Filled 2024-10-14: qty 2

## 2024-10-14 MED ORDER — ORAL CARE MOUTH RINSE
15.0000 mL | Freq: Once | OROMUCOSAL | Status: AC
  Administered 2024-10-14: 15 mL via OROMUCOSAL

## 2024-10-14 MED ORDER — LIDOCAINE 2% (20 MG/ML) 5 ML SYRINGE
INTRAMUSCULAR | Status: DC | PRN
  Administered 2024-10-14: 80 mg via INTRAVENOUS

## 2024-10-14 NOTE — Anesthesia Preprocedure Evaluation (Addendum)
 "                                  Anesthesia Evaluation  Patient identified by MRN, date of birth, ID band Patient awake    Reviewed: Allergy & Precautions, NPO status , Patient's Chart, lab work & pertinent test results, reviewed documented beta blocker date and time   History of Anesthesia Complications (+) PONV and history of anesthetic complications  Airway Mallampati: I  TM Distance: >3 FB     Dental no notable dental hx. (+) Caps, Teeth Intact, Dental Advisory Given   Pulmonary shortness of breath and with exertion, sleep apnea , pneumonia, resolved   Pulmonary exam normal breath sounds clear to auscultation       Cardiovascular hypertension, Pt. on medications and Pt. on home beta blockers + angina  + CAD and + Peripheral Vascular Disease  Normal cardiovascular exam Rhythm:Regular Rate:Normal  EKG 10/14/24 NSR  Echo 2017 LVEF 60-65% with no RWMA   Neuro/Psych   Anxiety     Lumbar and cervical stenosis S/P surgery Hx/o retinal detachment Restless legs syndrome Optic neuritis and macular degeneration OD  Neuromuscular disease    GI/Hepatic ,GERD  Medicated,,(+) Hepatitis -  Endo/Other  HLD  Renal/GU Renal diseaseHx/o nephrolithiasis Hx/o ureteral stricture  Lab Results      Component                Value               Date                      NA                       138                 10/14/2024                CL                       98                  10/14/2024                K                        3.6                 10/14/2024                CO2                      33 (H)              10/14/2024                BUN                      16                  10/14/2024                CREATININE               0.94  10/14/2024                GFRNONAA                 >60                 10/14/2024                CALCIUM                   9.7                 10/14/2024                PHOS                     3.2                  09/25/2017                ALBUMIN                   3.5                 10/14/2024                GLUCOSE                  138 (H)             10/14/2024             negative genitourinary   Musculoskeletal  (+) Arthritis , Osteoarthritis,  Left lateral abdominal wall hernia   Abdominal   Peds  Hematology  (+) Blood dyscrasia, anemia   Anesthesia Other Findings   Reproductive/Obstetrics                              Anesthesia Physical Anesthesia Plan  ASA: 2  Anesthesia Plan: General   Post-op Pain Management: Minimal or no pain anticipated, Precedex and Ofirmev  IV (intra-op)*   Induction: Intravenous  PONV Risk Score and Plan: 4 or greater and Treatment may vary due to age or medical condition, Midazolam , Ondansetron  and Dexamethasone   Airway Management Planned: Oral ETT  Additional Equipment: None  Intra-op Plan:   Post-operative Plan: Extubation in OR  Informed Consent: I have reviewed the patients History and Physical, chart, labs and discussed the procedure including the risks, benefits and alternatives for the proposed anesthesia with the patient or authorized representative who has indicated his/her understanding and acceptance.     Dental advisory given  Plan Discussed with: CRNA and Anesthesiologist  Anesthesia Plan Comments:          Anesthesia Quick Evaluation  "

## 2024-10-14 NOTE — ED Triage Notes (Signed)
 Pt BIB EMS from home. Kidney surgery on Friday, discharged Sunday. Now feeling dull consistent pain w nausea and unable to keep anything down.   EMS vitals  BP 128/60 HR 88 SPO2 96 RA

## 2024-10-14 NOTE — H&P (Signed)
 [name] is an 69 y.o. female.    Chief Complaint: Small Bowel Obstruction from Likely Port Site Hernia  HPI:   1- Small Bowel Obstruction from Likely Port Site Hernia - 2 days of worsening nausea and emesis 4 days after uncomplicated left uretero-ureterostomy. ER CT with likely lateral port site hernia with strangulated small bowel (prior 8mm port site). No fevers / leukocytosis. Cr 0.9.   2- LEFT Urolithiasis - S/p 2 stage ureteroscopy for very large UPJ stone around 04/2024 (declined PCNL).  3 - High Grade LEFT Ureteral Sricture - s/p LEFT uretero-ureterostomy with nephropexy and ureteral stent placement 10/10/24 for high grade ureteral stricture. CT this admission with stent in good position, no large fluid collections.   PMH sig for obesity, lumbago, ocular neuritis / Imuran  use. Her PCP is [name].  Today [name] is seen for emergent evaluation of likely small bowel obstruction from port site hernia. Last meal Sat but full stomach on CT.   Past Medical History:  Diagnosis Date   Anemia    Anxiety    Arthritis    Carpal tunnel syndrome    Right hand   Coronary artery disease    Deviated septum    Dyspnea    with exertion   wheezing at night   Fibroid tumor    GERD (gastroesophageal reflux disease) 11/02/2015   Hepatitis    AS CHILD  , 4th grade   History of kidney stones    Hypercholesteremia 07/21/2015   Hypertension 07/10/2015   Macular degeneration    Right   Memory loss    Meniscus tear    Numbness in feet    Optic neuritis    Right   Pneumonia    Walking   PONV (postoperative nausea and vomiting)    Restless leg syndrome 09/01/2015   Retinal detachment 03/06/2001   Spinal stenosis    Urinary frequency    Vertigo     Past Surgical History:  Procedure Laterality Date   ANTERIOR CERVICAL DECOMP/DISCECTOMY FUSION N/A 11/02/2015   Procedure: ANTERIOR CERVICAL DECOMPRESSION FUSION CERVICAL FIVE-SIX,CERVICAL SIX-SEVEN;  Surgeon: Cheryl Boehringer, Miller;   Location: [facility];  Service: Neurosurgery;  Laterality: N/A;  right side approach   BACK SURGERY     mild   CATARACT EXTRACTION Bilateral    CYSTOSCOPY W/ RETROGRADES Left 08/20/2024   Procedure: CYSTOSCOPY, WITH RETROGRADE PYELOGRAM;  Surgeon: [name]., Miller;  Location: [facility];  Service: Urology;  Laterality: Left;   CYSTOSCOPY W/ RETROGRADES Left 10/10/2024   Procedure: CYSTOSCOPY, WITH RETROGRADE PYELOGRAM;  Surgeon: [name]., Miller;  Location: [facility];  Service: Urology;  Laterality: Left;   CYSTOSCOPY WITH INDOCYANINE GREEN  IMAGING (ICG) Left 10/10/2024   Procedure: CYSTOSCOPY WITH INDOCYANINE GREEN  IMAGING (ICG);  Surgeon: [name]., Miller;  Location: [facility];  Service: Urology;  Laterality: Left;   CYSTOSCOPY/URETEROSCOPY/HOLMIUM LASER/STENT PLACEMENT Left 04/15/2024   Procedure: CYSTOSCOPY/URETEROSCOPY/HOLMIUM LASER/STENT PLACEMENT;  Surgeon: Cheryl Valli BIRCH, Miller;  Location: [facility];  Service: Urology;  Laterality: Left;  CYSTOSCOPY/LEFT URETEROSCOPY/HOLMIUM LASER/STENT PLACEMENT   CYSTOSCOPY/URETEROSCOPY/HOLMIUM LASER/STENT PLACEMENT Left 05/08/2024   Procedure: CYSTOSCOPY/URETEROSCOPY/HOLMIUM LASER/STENT PLACEMENT;  Surgeon: Cheryl Valli BIRCH, Miller;  Location: [facility];  Service: Urology;  Laterality: Left;  CYSTOSCOPY/LEFT URETEROSCOPY/HOLMIUM LASER/STENT EXCHANGE   CYSTOSCOPY/URETEROSCOPY/HOLMIUM LASER/STENT PLACEMENT Left 08/20/2024   Procedure: CYSTOSCOPY/DIAGNOSTIC URETEROSCOPY/HOLMIUM LASER;  Surgeon: [name]., Miller;  Location: [facility];  Service: Urology;  Laterality: Left;   fibroid tumor  KNEE SURGERY Bilateral    RETINAL DETACHMENT SURGERY Right 03/06/2001   TOTAL KNEE ARTHROPLASTY Right 11/08/2017   Procedure: RIGHT TOTAL KNEE ARTHROPLASTY;  Surgeon: Cheryl Kay HERO, Miller;  Location: [facility];  Service: Orthopedics;  Laterality: Right;    Family History  Problem Relation Age of Onset   Stroke Mother    Arrhythmia Mother    Bladder Cancer  Mother    Thyroid  disease Mother    Heart disease Father    Heart attack Father    Stroke Maternal Grandmother    Stroke Maternal Grandfather    Breast cancer Neg Hx    Social History:  reports that she has never smoked. She has never used smokeless tobacco. She reports that she does not drink alcohol  and does not use drugs.  Allergies: Allergies[1]  (Not in a hospital admission)   Results for orders placed or performed during the hospital encounter of 10/14/24 (from the past 48 hours)  Lipase, blood     Status: None   Collection Time: 10/14/24  1:53 PM  Result Value Ref Range   Lipase 15 11 - 51 U/L    Comment: Performed at [facility], [address] [place], [place] [address]  Comprehensive metabolic panel     Status: Abnormal   Collection Time: 10/14/24  1:53 PM  Result Value Ref Range   Sodium 138 135 - 145 mmol/L   Potassium 3.6 3.5 - 5.1 mmol/L   Chloride 98 98 - 111 mmol/L   CO2 33 (H) 22 - 32 mmol/L   Glucose, Bld 138 (H) 70 - 99 mg/dL    Comment: Glucose reference range applies only to samples taken after fasting for at least 8 hours.   BUN 16 8 - 23 mg/dL   Creatinine, Ser 9.05 0.44 - 1.00 mg/dL   Calcium  9.7 8.9 - 10.3 mg/dL   Total Protein 6.2 (L) 6.5 - 8.1 g/dL   Albumin  3.5 3.5 - 5.0 g/dL   AST 35 15 - 41 U/L   ALT 15 0 - 44 U/L   Alkaline Phosphatase 65 38 - 126 U/L   Total Bilirubin 0.6 0.0 - 1.2 mg/dL   GFR, Estimated >39 >39 mL/min    Comment: (NOTE) Calculated using the CKD-EPI Creatinine Equation (2021)    Anion gap 8 5 - 15    Comment: Performed at [facility], [address] [place], [place] [address]  CBC     Status: Abnormal   Collection Time: 10/14/24  1:53 PM  Result Value Ref Range   WBC 9.8 4.0 - 10.5 K/uL   RBC 3.72 (L) 3.87 - 5.11 MIL/uL   Hemoglobin 12.3 12.0 - 15.0 g/dL   HCT 63.5 63.9 - 53.9 %   MCV 97.8 80.0 - 100.0 fL   MCH 33.1 26.0 - 34.0 pg   MCHC 33.8 30.0 - 36.0 g/dL   RDW 87.4  88.4 - 84.4 %   Platelets 244 150 - 400 K/uL   nRBC 0.0 0.0 - 0.2 %    Comment: Performed at [facility], [address] [place], [place] [address]   CT ABDOMEN PELVIS WO CONTRAST Result Date: 10/14/2024 EXAM: CT ABDOMEN AND PELVIS WITHOUT CONTRAST 10/14/2024 01:36:57 PM TECHNIQUE: CT of the abdomen and pelvis was performed without the administration of intravenous contrast. Multiplanar reformatted images are provided for review. Automated exposure control, iterative reconstruction, and/or weight-based adjustment of the mA/kV was utilized to reduce the radiation dose to as  low as reasonably achievable. COMPARISON: CT scan dated 08/15/2024. CLINICAL HISTORY: Abdominal pain and nausea, postoperative day 4 status post left ureteroureterostomy, nephropexy, and left ureteral stent placement. FINDINGS: LOWER CHEST: Pleural effusion with mild atelectasis in the left lower lobe. Descending thoracic aortic and right coronary artery atheromatous vascular calcification. LIVER: The liver is unremarkable. GALLBLADDER AND BILE DUCTS: Contracted gallbladder. No biliary ductal dilatation. SPLEEN: New peripheral triangular hypodense lesion in the posterior superior spleen on image 118 series 7, possibly fluid along a lobulation of the spleen which is somewhat shifted in position compared to previous, but a small superior splenic laceration or infarct cannot be excluded on image 19 series 2. Small amount of extraluminal gas immediately below the spleen. PANCREAS: Atrophic pancreas. ADRENAL GLANDS: No acute abnormality. KIDNEYS, URETERS AND BLADDER: The left double J ureteral stent is present with proximal and distal loops formed in the left renal pelvis and urinary bladder, respectively. About 10 nonobstructive left renal calculi are present, 1 of the largest in the lower pole measuring 1.0 cm in long axis. 3 nonobstructive right renal calculi measure up to 7 mm in long axis. No current hydronephrosis.  There is left periureteral stranding but no obvious large fluid collection in the immediate vicinity of the left ureter. Trace amount of gas in the left perirenal space and left retroperitoneum. Urinary bladder is unremarkable. GI AND BOWEL: Substantially dilated stomach. Dilated loops of jejunum extend to a transition point in the left abdomen around image 48 of series 2. On coronal multiplanar reconstructed images 53 through 59 of series 7, there is suspicion for herniation of the jejunum at the transition point through the lateral abdominal wall musculature as a potential lead point for obstruction, with loops distal to this hernia noted not dilated. The appearance is most compatible with a small bowel obstruction due to hernia. The herniated bowel is amorphous and also surrounded by subcutaneous emphysema, making it difficult to assess the bowel separate from the regional musculature, with the surrounding subcutaneous emphysema also complicating the picture. I favor that the subcutaneous emphysema is likely related to the recent laparoscopy rather than necessarily being due to perforation of the herniated bowel sac given the volume of gas involved and its distribution. Sigmoid colon diverticulosis. PERITONEUM AND RETROPERITONEUM: Small amount of free pelvic fluid. Faint stranding in the omentum. Trace amount of pneumoperitoneum. Trace amount of gas in the left perirenal space and left retroperitoneum. VASCULATURE: Aorta is normal in caliber. Systemic atherosclerosis is present, including the aorta and iliac arteries. LYMPH NODES: No lymphadenopathy. REPRODUCTIVE ORGANS: No acute abnormality. BONES AND SOFT TISSUES: Lumbar spondylosis and degenerative disc disease. Substantial subcutaneous gas along the anterior abdominal wall and left flank breaking all the way down into the labia from the chest region. A small amount of this gas dissects in the subxiphoid space into the fatty tissues adjacent to the  pericardium. IMPRESSION: 1. Acute small bowel obstruction from herniation of distal jejunum through a left lateral abdominal wall defect. 2. New peripheral triangular hypodense lesion in the posterior superior spleen, possibly from mild ascites outlining lobulation, but a small splenic laceration or splenic infarct cannot be excluded. 3. Extensive subcutaneous emphysema along the anterior abdominal wall and left flank extending into the labia, with trace pneumoperitoneum and trace gas extending into the subxiphoid fat adjacent to the pericardium ;much of this is thought to be postoperative. 4. Left double j ureteral stent in place without hydronephrosis, with left periureteral stranding and trace gas in the left perirenal space  and retroperitoneum, without a large adjacent fluid collection. 5. Bilateral nonobstructive renal calculi. 6. Small left pleural effusion with mild left lower lobe atelectasis. 7. Sigmoid colon diverticulosis. 8. Systemic atherosclerosis. Electronically signed by: Ryan Salvage Miller 10/14/2024 02:00 PM EST RP Workstation: [id number]    Review of Systems  Constitutional:  Negative for chills, fatigue and fever.  Gastrointestinal:  Positive for abdominal distention, nausea and vomiting.  All other systems reviewed and are negative.   Blood pressure (!) 152/61, pulse 70, temperature 98.4 F (36.9 C), temperature source Oral, resp. rate 18, height 5' 6 (1.676 m), weight 72.4 kg, SpO2 95%. Physical Exam Vitals reviewed.  Constitutional:      Comments: Uncomfortable in ER, emesis bag in her hand.   HENT:     Head: Normocephalic.  Cardiovascular:     Rate and Rhythm: Normal rate.  Pulmonary:     Effort: Pulmonary effort is normal.  Abdominal:     Comments: Mild abd distension and tenerness w/o reboung or guarding. Area of likey port site hernai left lateral without extreme pain / redness or any drainage.   Genitourinary:    Comments: No CVAT     Assessment/Plan  Rec  emergent OR exploration for laparoscopic port site hernia repair. Risks, benefits, alternatives, expected peri-op course discussed. Also frankly discussed may require conversion to open and/or bowel resection if bowel non-viable.   Ricardo KATHEE [name]., Miller 10/14/2024, 4:03 PM       [1]  Allergies Allergen Reactions   Ezetimibe Other (See Comments)    Myalgia   Prednisone  Anxiety and Other (See Comments)    Sleep disturbances, Agitation, Confusion   Rosuvastatin Other (See Comments)    Myalgia

## 2024-10-14 NOTE — Op Note (Unsigned)
 [name], [name] MEDICAL RECORD NO: [id number] ACCOUNT NO: [id number] DATE OF BIRTH: 1956/04/24 FACILITY: [facility] LOCATION: [facility] PHYSICIAN: Ricardo Likens, MD  Operative Report   DATE OF PROCEDURE: 10/14/2024  SURGEON: Ricardo Likens, MD  PREOPERATIVE DIAGNOSIS:  High grade small bowel obstruction from port site hernia.  PROCEDURE PERFORMED:  Diagnostic laparoscopy and port site hernia repair laparoscopic.  ESTIMATED BLOOD LOSS:  Nil.  COMPLICATIONS:  None.  SPECIMENS:  None.  FINDINGS:  Preoperative findings. 1.  High grade small bowel obstruction from small port site hernia. 2.  Successful reduction of strangulated bowel with excellent bowel viability. 3.  Successful repair of port site hernia with simple nonabsorbable closure.  This is on the left or the prior left lateral most robotic port site. 3.  Large volume stomach contents as anticipated.  INDICATIONS:  The patient is an incredibly pleasant 69 year old woman who is status post recent ureteroureterostomy 4 days ago robotically and she did very well perioperatively.  She was discharged home however she developed progressive abdominal  distention, nausea, emesis, poor p.o. intake uptake, presenting to the Emergency Room today where CAT scan revealed appeared to be a small bowel obstruction likely from port site hernia on her previous left lateral most port site.  She was evaluated,  fortunately not febrile or septic but given the constellation of symptoms it was clearly felt that urgent operative exploration and repair would be warranted to prevent any progressive bowel ischemia.  She was noted to have significantly dilated stomach  on her prior imaging despite being having not eaten in almost 48 hours.  Informed consent was obtained and placed in the medical record.  DESCRIPTION OF PROCEDURE:  The patient being [name] verified and the procedure being diagnostic laparoscopy and likely port site hernia repair is  confirmed.  Procedure timeout was performed. Intravenous antibiotics administered.  General  endotracheal anesthesia was very carefully introduced using rapid sequence technique by the anesthesia staff.  After the airway was secured, OG tube was immediately placed and very large volume enteric contents were suctioned away.  Once it was felt the  stomach had been adequately drained, the patient was then placed into a left side up full flank position employing 15 degrees of table flexion, superior arm elevator, axillary roll, sequential compression devices, bottom leg bent, top leg straight.  She  was further fastened to the operating table using 3-inch tape over foam padding across her supraxiphoid chest , and her pelvis.  Superior arm elevator was used.  A sterile field was created by prepping and draping the patient's entire left flank and  abdomen using chlorhexidine  gluconate allowing all of her previous port sites to be within the abdominal field.  Next, a high flow low pressure pneumoperitoneum was obtained by placing a 5 mm trocar bluntly without an obturator through the previous  robotic camera port site very carefully by palpation and this easily was able to traverse into the peritoneal cavity.  Insufflation was achieved via this and a 5 mm camera introduced with 30-degree offset.  Diagnostic laparoscopy revealed dilation of the  small bowel diffusely, no evidence of any bowel non-viability.  Retroperitoneum was intact without any obvious fluid collections.  As anticipated, there was a knuckle of bowel entering and exiting the previous lateral most port site.  An additional 5 mm  port was then placed in the previous inferior most paramedian robotic port site also using a blunt, purely blunt technique.  Using atraumatic bowel graspers, this loop of bowel  was reduced from the small hernia defect.  The bowel fortunately was  completely viable, very pink without any evidence of necrosis or serosal injury  or other abnormalities.  The small hernia defect was then palpated via the skin and again, the defect size was actually less than the surgeon's pinky.  The defect was then  closed using 2-0 Ethibond with a Carter-Thomason technique x 2 which then completely resolved the small palpable fascial defect both by palpation and laparoscopically.  I was quite happy with the completeness of this. And then, again the abdomen was once  again examined laparoscopically, no additional concerning findings were found.  The insufflation was taken down.  The 2 very small 5 mm port sites were then closed with the deep dermal and superficial fascial layer using a UR-6 needle.  All incision  sites were then infiltrated with dilute Marcaine  and closed at the level of the skin using subcuticular Monocryl and Dermabond.  Procedure was then terminated.  The OG was exchanged for an NG tube before attempts at extubation.  Patient was taken to  postanesthesia care unit in stable condition.  Plan for progressive care admission.    MUK D: 10/14/2024 7:39:47 pm T: 10/14/2024 8:35:00 pm  JOB: [id number]/ [id number]

## 2024-10-14 NOTE — ED Notes (Signed)
 Care delay: pt in scanner

## 2024-10-14 NOTE — Brief Op Note (Signed)
 10/14/2024  6:31 PM  7:31 PM  PATIENT:  [name]  69 y.o. female  PRE-OPERATIVE DIAGNOSIS:   port site hernia, small bowel obstruction  POST-OPERATIVE DIAGNOSIS:  same  PROCEDURE:  Procedures with comments: REPAIR, POST OPERATIVE PORT SITE HERNIA,, LAPAROSCOPIC (Left) - Laproscopic port side hernia repair  SURGEON:  Surgeons and Role:    * Manny, Ricardo KATHEE Raddle., MD - Primary  PHYSICIAN ASSISTANT:   ASSISTANTS: Clifm Cobb MD   ANESTHESIA:   local and general  EBL:  minimal   BLOOD ADMINISTERED:none  DRAINS: none   LOCAL MEDICATIONS USED:  MARCAINE      SPECIMEN:  No Specimen  DISPOSITION OF SPECIMEN:  N/A  COUNTS:  YES  TOURNIQUET:  * No tourniquets in log *  DICTATION: .Other Dictation: Dictation Number [id number]  PLAN OF CARE: Admit to inpatient   PATIENT DISPOSITION:  PACU - hemodynamically stable.   Delay start of Pharmacological VTE agent (>24hrs) due to surgical blood loss or risk of bleeding: not applicable

## 2024-10-14 NOTE — Anesthesia Postprocedure Evaluation (Signed)
"   Anesthesia Post Note  Patient: [name]  Procedure(s) Performed: REPAIR, POST OPERATIVE PORT SITE HERNIA,, LAPAROSCOPIC (Left)     Patient location during evaluation: PACU Anesthesia Type: General Level of consciousness: awake and alert Pain management: pain level controlled Vital Signs Assessment: post-procedure vital signs reviewed and stable Respiratory status: spontaneous breathing, nonlabored ventilation, respiratory function stable and patient connected to nasal cannula oxygen Cardiovascular status: blood pressure returned to baseline and stable Postop Assessment: no apparent nausea or vomiting Anesthetic complications: no   No notable events documented.  Last Vitals:  Vitals:   10/14/24 1945 10/14/24 1950  BP: (!) 156/80 (!) 156/80  Pulse: 88 93  Resp: 18 17  Temp:    SpO2: 94% 91%    Last Pain:                 Garnette FORBES Skillern      "

## 2024-10-14 NOTE — ED Provider Notes (Signed)
 " [place] [facility] AT [facility] Provider Note   CSN: [id number] Arrival date & time: 10/14/24  1242     Patient presents with: Abdominal Pain and Post-op Problem   [name] is a 69 y.o. female.   HPI Patient presents 4 days postop now with diffuse abdominal pain, nausea, vomiting.  Procedure was kidney stone removal, reimplantation of ureter.  Per her report procedure was uncomplicated, patient was generally well at home, and over the past hours has developed nausea, vomiting, and left-sided abdominal pain.  No fever, confusion, fall.    Prior to Admission medications  Medication Sig Start Date End Date Taking? Authorizing Provider  aspirin  EC 81 MG tablet Take 81 mg by mouth in the morning. Swallow whole.    [provider]  azaTHIOprine  (IMURAN ) 50 MG tablet Take 1 tablet (50 mg total) by mouth daily. 05/16/16   Onita Duos, MD  carvedilol  (COREG ) 12.5 MG tablet Take 6.25 mg by mouth 2 (two) times daily with a meal.    [provider]  chlorthalidone  (HYGROTON ) 25 MG tablet Take 25 mg by mouth every Monday, Wednesday, and Friday. In the morning.    [provider]  cholecalciferol  (VITAMIN D3) 25 MCG (1000 UNIT) tablet Take 1,000 Units by mouth in the morning.    [provider]  cyanocobalamin (VITAMIN B12) 1000 MCG tablet Take 1,000 mcg by mouth at bedtime.    [provider]  docusate sodium  (COLACE) 100 MG capsule Take 100 mg by mouth at bedtime. Patient not taking: Reported on 10/08/2024    [provider]  Faricimab -svoa (VABYSMO  IZ) 1 Dose by Intravitreal route every 6 (six) weeks. Right eye    [provider]  gabapentin  (NEURONTIN ) 300 MG capsule Take 300-600 mg by mouth See admin instructions. Take 1 capsule (300 mg) by mouth every morning & take 2 capsules (600 mg) by mouth every night. 04/05/20   [provider]  melatonin 5 MG TABS Take 10 mg by mouth at bedtime.     [provider]  Multiple Vitamins-Minerals (CENTRUM SILVER WOMEN 50+) TABS Take 1 tablet by mouth daily.    [provider]  omeprazole (PRILOSEC) 20 MG capsule Take 20 mg by mouth daily as needed (Heartburn). 02/17/16   [provider]  OVER THE COUNTER MEDICATION Take 1 tablet by mouth at bedtime. Opti Gold    [provider]  oxyCODONE -acetaminophen  (PERCOCET) 5-325 MG tablet Take 1 tablet by mouth every 6 (six) hours as needed for moderate pain (pain score 4-6) or severe pain (pain score 7-10). Post-operatively. 10/10/24 10/10/25  Alvaro Ricardo KATHEE Mickey., MD  polyethylene glycol powder (GLYCOLAX /MIRALAX ) 17 GM/SCOOP powder Take 17 g by mouth daily as needed (constipation.).    [provider]  psyllium (REGULOID) 0.52 g capsule Take 0.52 g by mouth daily.    [provider]  rOPINIRole  (REQUIP ) 0.5 MG tablet Take 0.5 mg by mouth at bedtime. 03/13/20   [provider]  senna-docusate (SENOKOT-S) 8.6-50 MG tablet Take 1 tablet by mouth 2 (two) times daily. While taking strong pain meds to prevent constipation Patient not taking: Reported on 10/08/2024 08/20/24   Alvaro Ricardo KATHEE Mickey., MD  senna-docusate (SENOKOT-S) 8.6-50 MG tablet Take 1 tablet by mouth 2 (two) times daily. While taking strong pain meds to prevent constipation. 10/10/24   Alvaro Ricardo KATHEE Mickey., MD  Study - VICTORION-1 PREVENT - inclisiran 300 mg/1.80mL or placebo SQ injection (PI-Stuckey) Inject 300 mg  into the skin every 6 (six) months. For Investigational Use Only. Inject subcutaneously into the abdomen, upper arm or thigh every 6 months. Do not inject into areas of active skin disease,such as sun burns, skin rashes, inflammation, or skin infections.Please contact [facility] for any questions or concerns.    Morris Debby BIRCH, MD    Allergies: Ezetimibe, Prednisone , and Rosuvastatin    Review of Systems  Updated Vital Signs BP (!) 152/61 (BP Location:  Right Arm)   Pulse 70   Temp 98.4 F (36.9 C) (Oral)   Resp 18   Ht 1.676 m (5' 6)   Wt 72.4 kg   SpO2 95%   BMI 25.76 kg/m   Physical Exam Vitals and nursing note reviewed.  Constitutional:      General: She is not in acute distress.    Appearance: She is well-developed.  HENT:     Head: Normocephalic and atraumatic.  Eyes:     Conjunctiva/sclera: Conjunctivae normal.  Cardiovascular:     Rate and Rhythm: Normal rate and regular rhythm.  Pulmonary:     Effort: Pulmonary effort is normal. No respiratory distress.     Breath sounds: No stridor.  Abdominal:     General: There is no distension.     Tenderness: There is abdominal tenderness in the left upper quadrant and left lower quadrant.  Skin:    General: Skin is warm and dry.  Neurological:     Mental Status: She is alert and oriented to person, place, and time.     Cranial Nerves: No cranial nerve deficit.  Psychiatric:        Mood and Affect: Mood normal.     (all labs ordered are listed, but only abnormal results are displayed) Labs Reviewed  COMPREHENSIVE METABOLIC PANEL WITH GFR - Abnormal; Notable for the following components:      Result Value   CO2 33 (*)    Glucose, Bld 138 (*)    Total Protein 6.2 (*)    All other components within normal limits  CBC - Abnormal; Notable for the following components:   RBC 3.72 (*)    All other components within normal limits  LIPASE, BLOOD  URINALYSIS, W/ REFLEX TO CULTURE (INFECTION SUSPECTED)    EKG: EKG Interpretation Date/Time:  Tuesday October 14 2024 13:00:52 EST Ventricular Rate:  70 PR Interval:  138 QRS Duration:  90 QT Interval:  358 QTC Calculation: 386 R Axis:   78  Text Interpretation: Normal sinus rhythm ST-t wave abnormality Abnormal ECG Confirmed by Garrick Charleston [phone]) on 10/14/2024 1:32:48 PM  Radiology: CT ABDOMEN PELVIS WO CONTRAST Result Date: 10/14/2024 EXAM: CT ABDOMEN AND PELVIS WITHOUT CONTRAST 10/14/2024 01:36:57 PM TECHNIQUE:  CT of the abdomen and pelvis was performed without the administration of intravenous contrast. Multiplanar reformatted images are provided for review. Automated exposure control, iterative reconstruction, and/or weight-based adjustment of the mA/kV was utilized to reduce the radiation dose to as low as reasonably achievable. COMPARISON: CT scan dated 08/15/2024. CLINICAL HISTORY: Abdominal pain and nausea, postoperative day 4 status post left ureteroureterostomy, nephropexy, and left ureteral stent placement. FINDINGS: LOWER CHEST: Pleural effusion with mild atelectasis in the left lower lobe. Descending thoracic aortic and right coronary artery atheromatous vascular calcification. LIVER: The liver is unremarkable. GALLBLADDER AND BILE DUCTS: Contracted gallbladder. No biliary ductal dilatation. SPLEEN: New peripheral triangular hypodense lesion in the posterior superior spleen on image 118 series 7, possibly fluid along a lobulation of the spleen which is somewhat  shifted in position compared to previous, but a small superior splenic laceration or infarct cannot be excluded on image 19 series 2. Small amount of extraluminal gas immediately below the spleen. PANCREAS: Atrophic pancreas. ADRENAL GLANDS: No acute abnormality. KIDNEYS, URETERS AND BLADDER: The left double J ureteral stent is present with proximal and distal loops formed in the left renal pelvis and urinary bladder, respectively. About 10 nonobstructive left renal calculi are present, 1 of the largest in the lower pole measuring 1.0 cm in long axis. 3 nonobstructive right renal calculi measure up to 7 mm in long axis. No current hydronephrosis. There is left periureteral stranding but no obvious large fluid collection in the immediate vicinity of the left ureter. Trace amount of gas in the left perirenal space and left retroperitoneum. Urinary bladder is unremarkable. GI AND BOWEL: Substantially dilated stomach. Dilated loops of jejunum extend to a  transition point in the left abdomen around image 48 of series 2. On coronal multiplanar reconstructed images 53 through 59 of series 7, there is suspicion for herniation of the jejunum at the transition point through the lateral abdominal wall musculature as a potential lead point for obstruction, with loops distal to this hernia noted not dilated. The appearance is most compatible with a small bowel obstruction due to hernia. The herniated bowel is amorphous and also surrounded by subcutaneous emphysema, making it difficult to assess the bowel separate from the regional musculature, with the surrounding subcutaneous emphysema also complicating the picture. I favor that the subcutaneous emphysema is likely related to the recent laparoscopy rather than necessarily being due to perforation of the herniated bowel sac given the volume of gas involved and its distribution. Sigmoid colon diverticulosis. PERITONEUM AND RETROPERITONEUM: Small amount of free pelvic fluid. Faint stranding in the omentum. Trace amount of pneumoperitoneum. Trace amount of gas in the left perirenal space and left retroperitoneum. VASCULATURE: Aorta is normal in caliber. Systemic atherosclerosis is present, including the aorta and iliac arteries. LYMPH NODES: No lymphadenopathy. REPRODUCTIVE ORGANS: No acute abnormality. BONES AND SOFT TISSUES: Lumbar spondylosis and degenerative disc disease. Substantial subcutaneous gas along the anterior abdominal wall and left flank breaking all the way down into the labia from the chest region. A small amount of this gas dissects in the subxiphoid space into the fatty tissues adjacent to the pericardium. IMPRESSION: 1. Acute small bowel obstruction from herniation of distal jejunum through a left lateral abdominal wall defect. 2. New peripheral triangular hypodense lesion in the posterior superior spleen, possibly from mild ascites outlining lobulation, but a small splenic laceration or splenic infarct  cannot be excluded. 3. Extensive subcutaneous emphysema along the anterior abdominal wall and left flank extending into the labia, with trace pneumoperitoneum and trace gas extending into the subxiphoid fat adjacent to the pericardium ;much of this is thought to be postoperative. 4. Left double j ureteral stent in place without hydronephrosis, with left periureteral stranding and trace gas in the left perirenal space and retroperitoneum, without a large adjacent fluid collection. 5. Bilateral nonobstructive renal calculi. 6. Small left pleural effusion with mild left lower lobe atelectasis. 7. Sigmoid colon diverticulosis. 8. Systemic atherosclerosis. Electronically signed by: Ryan Salvage MD 10/14/2024 02:00 PM EST RP Workstation: [id number]     Procedures   Medications Ordered in the ED  piperacillin -tazobactam (ZOSYN ) IVPB 3.375 g (has no administration in time range)  metoCLOPramide  (REGLAN ) injection 10 mg (has no administration in time range)  fentaNYL  (SUBLIMAZE ) injection 25 mcg (25 mcg Intravenous Given 10/14/24 1354)  ondansetron  (ZOFRAN ) injection 4 mg (4 mg Intravenous Given 10/14/24 1352)  sodium chloride  0.9 % bolus 500 mL (500 mLs Intravenous New Bag/Given 10/14/24 1359)                                    Medical Decision Making Adult female recent surgery now with abdominal pain nausea, vomiting, broad differential, bowel obstruction, infection, abscess. Initial vitals notable for hypertension.   Amount and/or Complexity of Data Reviewed External Data Reviewed: notes.    Details: Urology notes reviewed Labs: ordered. Decision-making details documented in ED Course. Radiology: ordered and independent interpretation performed. Decision-making details documented in ED Course.  Risk Prescription drug management. Decision regarding hospitalization. Diagnosis or treatment significantly limited by social determinants of health.  Surgeries: Procedures: URETEROURETEROSTOMY,  ROBOT ASSISTED CYSTOSCOPY WITH INDOCYANINE GREEN  IMAGING (ICG) CYSTOSCOPY, WITH RETROGRADE PYELOGRAM on 10/10/2024   Consultants (if any):    Discharged Condition: Improved   Hospital Course: SHONITA RINCK is an 69 y.o. female who was admitted 10/10/2024 with a diagnosis of Ureteral stricture and went to the operating room on 10/10/2024 and underwent the above named procedures.  She has been voiding well since the foley was removed.  Her JP drainage is minimal but slightly cream suggestive of lymph but with the small amount the drain was removed.      Update: CT with evidence for bowel obstruction, internal hernia, fluid collection, pneumoperitoneum. SABRA 4:20 PM I have discussed patient's case with her surgeon, he will see and evaluate the patient.  Patient with evidence for bowel obstruction, possible port site hernia versus internal hernia.  Patient will require admission for ongoing antiemetics, surgery.   Final diagnoses:  SBO (small bowel obstruction) [facility])    ED Discharge Orders     None          Garrick Charleston, MD 10/14/24 1712  "

## 2024-10-14 NOTE — Transfer of Care (Signed)
 Immediate Anesthesia Transfer of Care Note  Patient: [name]  Procedure(s) Performed: REPAIR, POST OPERATIVE PORT SITE HERNIA,, LAPAROSCOPIC (Left)  Patient Location: PACU  Anesthesia Type:General  Level of Consciousness: drowsy and patient cooperative  Airway & Oxygen Therapy: Patient Spontanous Breathing and Patient connected to nasal cannula oxygen  Post-op Assessment: Report given to RN and Post -op Vital signs reviewed and stable  Post vital signs: Reviewed and stable  Last Vitals:  Vitals Value Taken Time  BP 148/81 10/14/24 19:37  Temp    Pulse 92 10/14/24 19:38  Resp 26 10/14/24 19:38  SpO2 98 % 10/14/24 19:38  Vitals shown include unfiled device data.  Last Pain:  Vitals:   10/14/24 1800  TempSrc:   PainSc: Asleep         Complications: No notable events documented.

## 2024-10-14 NOTE — Anesthesia Procedure Notes (Signed)
 Procedure Name: Intubation Date/Time: 10/14/2024 6:40 PM  Performed by: Cena Epps, CRNAPre-anesthesia Checklist: Patient identified, Emergency Drugs available, Suction available and Patient being monitored Patient Re-evaluated:Patient Re-evaluated prior to induction Oxygen Delivery Method: Circle System Utilized Preoxygenation: Pre-oxygenation with 100% oxygen Induction Type: IV induction, Rapid sequence and Cricoid Pressure applied Laryngoscope Size: Mac and 3 Grade View: Grade I Tube type: Oral Tube size: 7.0 mm Number of attempts: 1 Airway Equipment and Method: Stylet and Oral airway Placement Confirmation: ETT inserted through vocal cords under direct vision, positive ETCO2 and breath sounds checked- equal and bilateral Secured at: 22 cm Tube secured with: Tape Dental Injury: Teeth and Oropharynx as per pre-operative assessment  Comments: Posterior oropharynx free of gastric content following ETT cuff inflation.

## 2024-10-15 ENCOUNTER — Encounter (HOSPITAL_COMMUNITY): Payer: Self-pay | Admitting: Urology

## 2024-10-15 LAB — BASIC METABOLIC PANEL WITH GFR
Anion gap: 9 (ref 5–15)
BUN: 13 mg/dL (ref 8–23)
CO2: 31 mmol/L (ref 22–32)
Calcium: 9.5 mg/dL (ref 8.9–10.3)
Chloride: 100 mmol/L (ref 98–111)
Creatinine, Ser: 0.93 mg/dL (ref 0.44–1.00)
GFR, Estimated: >60 mL/min
Glucose, Bld: 115 mg/dL — ABNORMAL HIGH (ref 70–99)
Potassium: 4 mmol/L (ref 3.5–5.1)
Sodium: 140 mmol/L (ref 135–145)

## 2024-10-15 LAB — CBC
HCT: 32.3 % — ABNORMAL LOW (ref 36.0–46.0)
Hemoglobin: 11 g/dL — ABNORMAL LOW (ref 12.0–15.0)
MCH: 33.3 pg (ref 26.0–34.0)
MCHC: 34.1 g/dL (ref 30.0–36.0)
MCV: 97.9 fL (ref 80.0–100.0)
Platelets: 232 K/uL (ref 150–400)
RBC: 3.3 MIL/uL — ABNORMAL LOW (ref 3.87–5.11)
RDW: 12.7 % (ref 11.5–15.5)
WBC: 8.9 K/uL (ref 4.0–10.5)
nRBC: 0 % (ref 0.0–0.2)

## 2024-10-15 MED ORDER — ROPINIROLE HCL 0.5 MG PO TABS
0.5000 mg | ORAL_TABLET | Freq: Every day | ORAL | Status: DC
  Administered 2024-10-15 – 2024-10-16 (×2): 0.5 mg via ORAL
  Filled 2024-10-15: qty 1
  Filled 2024-10-15: qty 2
  Filled 2024-10-15: qty 1
  Filled 2024-10-15: qty 2

## 2024-10-15 MED ORDER — SENNOSIDES-DOCUSATE SODIUM 8.6-50 MG PO TABS
2.0000 | ORAL_TABLET | Freq: Every day | ORAL | Status: DC
  Administered 2024-10-15 – 2024-10-16 (×2): 2 via ORAL
  Filled 2024-10-15 (×2): qty 2

## 2024-10-15 MED ORDER — OXYCODONE HCL 5 MG PO TABS: 5.0000 mg | ORAL_TABLET | Freq: Four times a day (QID) | ORAL | Status: DC | PRN

## 2024-10-15 MED ORDER — POLYETHYLENE GLYCOL 3350 17 G PO PACK: 17.0000 g | PACK | Freq: Every day | ORAL | Status: DC | PRN

## 2024-10-15 MED ORDER — OXYCODONE HCL 5 MG PO TABS
5.0000 mg | ORAL_TABLET | Freq: Four times a day (QID) | ORAL | Status: DC | PRN
  Administered 2024-10-15 – 2024-10-17 (×2): 5 mg via ORAL
  Filled 2024-10-15 (×2): qty 1

## 2024-10-15 MED ORDER — HYDROMORPHONE HCL 1 MG/ML IJ SOLN: 0.2500 mg | Freq: Four times a day (QID) | INTRAMUSCULAR | Status: DC | PRN

## 2024-10-15 MED ORDER — CHLORTHALIDONE 25 MG PO TABS
25.0000 mg | ORAL_TABLET | ORAL | Status: DC
  Filled 2024-10-15: qty 1

## 2024-10-15 MED ORDER — GABAPENTIN 300 MG PO CAPS
600.0000 mg | ORAL_CAPSULE | Freq: Every day | ORAL | Status: DC
  Administered 2024-10-15 – 2024-10-16 (×2): 600 mg via ORAL
  Filled 2024-10-15 (×2): qty 2

## 2024-10-15 MED ORDER — AZATHIOPRINE 50 MG PO TABS
50.0000 mg | ORAL_TABLET | Freq: Every day | ORAL | Status: DC
  Administered 2024-10-15 – 2024-10-17 (×3): 50 mg via ORAL
  Filled 2024-10-15 (×3): qty 1

## 2024-10-15 MED ORDER — GABAPENTIN 250 MG/5ML PO SOLN: 300.0000 mg | Freq: Every day | ORAL | Status: DC

## 2024-10-15 MED ORDER — GABAPENTIN 250 MG/5ML PO SOLN
600.0000 mg | Freq: Every day | ORAL | Status: DC
  Filled 2024-10-15: qty 12

## 2024-10-15 MED ORDER — ORAL CARE MOUTH RINSE: 15.0000 mL | OROMUCOSAL | Status: DC | PRN

## 2024-10-15 MED ORDER — MELATONIN 5 MG PO TABS
10.0000 mg | ORAL_TABLET | Freq: Every day | ORAL | Status: DC
  Administered 2024-10-15 – 2024-10-16 (×2): 10 mg via ORAL
  Filled 2024-10-15 (×2): qty 2

## 2024-10-15 MED ORDER — MIRABEGRON ER 25 MG PO TB24
25.0000 mg | ORAL_TABLET | Freq: Every day | ORAL | Status: DC
  Administered 2024-10-15 – 2024-10-17 (×3): 25 mg via ORAL
  Filled 2024-10-15 (×3): qty 1

## 2024-10-15 MED ORDER — CARVEDILOL 6.25 MG PO TABS
6.2500 mg | ORAL_TABLET | Freq: Two times a day (BID) | ORAL | Status: DC
  Administered 2024-10-16 – 2024-10-17 (×3): 6.25 mg via ORAL
  Filled 2024-10-15 (×3): qty 1

## 2024-10-15 MED ORDER — GABAPENTIN 300 MG PO CAPS
300.0000 mg | ORAL_CAPSULE | Freq: Every day | ORAL | Status: DC
  Administered 2024-10-16 – 2024-10-17 (×2): 300 mg via ORAL
  Filled 2024-10-15 (×2): qty 1

## 2024-10-15 MED ORDER — KETOROLAC TROMETHAMINE 15 MG/ML IJ SOLN
15.0000 mg | Freq: Three times a day (TID) | INTRAMUSCULAR | Status: DC
  Administered 2024-10-15: 15 mg via INTRAVENOUS
  Filled 2024-10-15: qty 1

## 2024-10-15 MED ORDER — KETOROLAC TROMETHAMINE 15 MG/ML IJ SOLN
15.0000 mg | Freq: Three times a day (TID) | INTRAMUSCULAR | Status: AC
  Administered 2024-10-15 – 2024-10-16 (×2): 15 mg via INTRAVENOUS
  Filled 2024-10-15 (×2): qty 1

## 2024-10-15 NOTE — Plan of Care (Signed)
" °  Problem: Cardiac: Goal: Ability to maintain an adequate cardiac output Outcome: Progressing Goal: Will show no evidence of cardiac arrhythmias Outcome: Progressing   Problem: Bowel/Gastric: Goal: Gastrointestinal status for postoperative course will improve Outcome: Progressing   "

## 2024-10-15 NOTE — Progress Notes (Addendum)
 "  1 Day Post-Op Subjective: AFVSS on RA, labs wnl, out of NG since last night, appropriate UoP. Abdomen soft, incisions c/d/I. Some left lateral thigh pain appears MSK, ambulating without difficulty.   Objective: Vital signs in last 24 hours: Temp:  [97.5 F (36.4 C)-99.2 F (37.3 C)] 99.2 F (37.3 C) (01/21 0507) Pulse Rate:  [70-94] 94 (01/21 0507) Resp:  [11-20] 18 (01/21 0507) BP: (139-193)/(61-95) 149/69 (01/21 0507) SpO2:  [90 %-98 %] 95 % (01/21 0507) Weight:  [72.4 kg] 72.4 kg (01/20 1300)  Assessment/Plan:  -Clamp NG, if tolerates plan for removal this afternoon -Continue ambulation  -Adjust pain medications -Bowel regimen   Intake/Output from previous day: 01/20 0701 - 01/21 0700 In: 1145.3 [P.O.:10; I.V.:835.3; IV Piggyback:300] Out: 3265 [Urine:800; Emesis/NG output:750; Blood:15]  Intake/Output this shift: No intake/output data recorded.  Physical Exam:  General: Alert and oriented CV: No cyanosis Lungs: equal chest rise Abdomen: Soft, NTND, no rebound or guarding. Incisions c/d/i Gu: Voiding spontaneously   Lab Results: Recent Labs    10/14/24 1353 10/15/24 0534  HGB 12.3 11.0*  HCT 36.4 32.3*   BMET Recent Labs    10/14/24 1353 10/15/24 0534  NA 138 140  K 3.6 4.0  CL 98 100  CO2 33* 31  GLUCOSE 138* 115*  BUN 16 13  CREATININE 0.94 0.93  CALCIUM  9.7 9.5     Studies/Results: CT ABDOMEN PELVIS WO CONTRAST Result Date: 10/14/2024 EXAM: CT ABDOMEN AND PELVIS WITHOUT CONTRAST 10/14/2024 01:36:57 PM TECHNIQUE: CT of the abdomen and pelvis was performed without the administration of intravenous contrast. Multiplanar reformatted images are provided for review. Automated exposure control, iterative reconstruction, and/or weight-based adjustment of the mA/kV was utilized to reduce the radiation dose to as low as reasonably achievable. COMPARISON: CT scan dated 08/15/2024. CLINICAL HISTORY: Abdominal pain and nausea, postoperative day 4  status post left ureteroureterostomy, nephropexy, and left ureteral stent placement. FINDINGS: LOWER CHEST: Pleural effusion with mild atelectasis in the left lower lobe. Descending thoracic aortic and right coronary artery atheromatous vascular calcification. LIVER: The liver is unremarkable. GALLBLADDER AND BILE DUCTS: Contracted gallbladder. No biliary ductal dilatation. SPLEEN: New peripheral triangular hypodense lesion in the posterior superior spleen on image 118 series 7, possibly fluid along a lobulation of the spleen which is somewhat shifted in position compared to previous, but a small superior splenic laceration or infarct cannot be excluded on image 19 series 2. Small amount of extraluminal gas immediately below the spleen. PANCREAS: Atrophic pancreas. ADRENAL GLANDS: No acute abnormality. KIDNEYS, URETERS AND BLADDER: The left double J ureteral stent is present with proximal and distal loops formed in the left renal pelvis and urinary bladder, respectively. About 10 nonobstructive left renal calculi are present, 1 of the largest in the lower pole measuring 1.0 cm in long axis. 3 nonobstructive right renal calculi measure up to 7 mm in long axis. No current hydronephrosis. There is left periureteral stranding but no obvious large fluid collection in the immediate vicinity of the left ureter. Trace amount of gas in the left perirenal space and left retroperitoneum. Urinary bladder is unremarkable. GI AND BOWEL: Substantially dilated stomach. Dilated loops of jejunum extend to a transition point in the left abdomen around image 48 of series 2. On coronal multiplanar reconstructed images 53 through 59 of series 7, there is suspicion for herniation of the jejunum at the transition point through the lateral abdominal wall musculature as a potential lead point for obstruction, with loops distal to this  hernia noted not dilated. The appearance is most compatible with a small bowel obstruction due to hernia.  The herniated bowel is amorphous and also surrounded by subcutaneous emphysema, making it difficult to assess the bowel separate from the regional musculature, with the surrounding subcutaneous emphysema also complicating the picture. I favor that the subcutaneous emphysema is likely related to the recent laparoscopy rather than necessarily being due to perforation of the herniated bowel sac given the volume of gas involved and its distribution. Sigmoid colon diverticulosis. PERITONEUM AND RETROPERITONEUM: Small amount of free pelvic fluid. Faint stranding in the omentum. Trace amount of pneumoperitoneum. Trace amount of gas in the left perirenal space and left retroperitoneum. VASCULATURE: Aorta is normal in caliber. Systemic atherosclerosis is present, including the aorta and iliac arteries. LYMPH NODES: No lymphadenopathy. REPRODUCTIVE ORGANS: No acute abnormality. BONES AND SOFT TISSUES: Lumbar spondylosis and degenerative disc disease. Substantial subcutaneous gas along the anterior abdominal wall and left flank breaking all the way down into the labia from the chest region. A small amount of this gas dissects in the subxiphoid space into the fatty tissues adjacent to the pericardium. IMPRESSION: 1. Acute small bowel obstruction from herniation of distal jejunum through a left lateral abdominal wall defect. 2. New peripheral triangular hypodense lesion in the posterior superior spleen, possibly from mild ascites outlining lobulation, but a small splenic laceration or splenic infarct cannot be excluded. 3. Extensive subcutaneous emphysema along the anterior abdominal wall and left flank extending into the labia, with trace pneumoperitoneum and trace gas extending into the subxiphoid fat adjacent to the pericardium ;much of this is thought to be postoperative. 4. Left double j ureteral stent in place without hydronephrosis, with left periureteral stranding and trace gas in the left perirenal space and  retroperitoneum, without a large adjacent fluid collection. 5. Bilateral nonobstructive renal calculi. 6. Small left pleural effusion with mild left lower lobe atelectasis. 7. Sigmoid colon diverticulosis. 8. Systemic atherosclerosis. Electronically signed by: Ryan Salvage MD 10/14/2024 02:00 PM EST RP Workstation: [id number]      LOS: 1 day    Dr. Alvaro is the attending of record for this consult.    10/15/2024, 7:37 AM   "

## 2024-10-16 LAB — BASIC METABOLIC PANEL WITH GFR
Anion gap: 8 (ref 5–15)
BUN: 17 mg/dL (ref 8–23)
CO2: 28 mmol/L (ref 22–32)
Calcium: 9.1 mg/dL (ref 8.9–10.3)
Chloride: 102 mmol/L (ref 98–111)
Creatinine, Ser: 0.95 mg/dL (ref 0.44–1.00)
GFR, Estimated: >60 mL/min
Glucose, Bld: 92 mg/dL (ref 70–99)
Potassium: 3.3 mmol/L — ABNORMAL LOW (ref 3.5–5.1)
Sodium: 138 mmol/L (ref 135–145)

## 2024-10-16 LAB — CBC
HCT: 33 % — ABNORMAL LOW (ref 36.0–46.0)
Hemoglobin: 10.9 g/dL — ABNORMAL LOW (ref 12.0–15.0)
MCH: 33.3 pg (ref 26.0–34.0)
MCHC: 33 g/dL (ref 30.0–36.0)
MCV: 100.9 fL — ABNORMAL HIGH (ref 80.0–100.0)
Platelets: 202 K/uL (ref 150–400)
RBC: 3.27 MIL/uL — ABNORMAL LOW (ref 3.87–5.11)
RDW: 12.8 % (ref 11.5–15.5)
WBC: 7.3 K/uL (ref 4.0–10.5)
nRBC: 0 % (ref 0.0–0.2)

## 2024-10-16 MED ORDER — KETOROLAC TROMETHAMINE 15 MG/ML IJ SOLN
15.0000 mg | Freq: Three times a day (TID) | INTRAMUSCULAR | Status: AC | PRN
  Administered 2024-10-16 (×2): 15 mg via INTRAVENOUS
  Filled 2024-10-16 (×2): qty 1

## 2024-10-16 NOTE — Progress Notes (Addendum)
 "  2 Days Post-Op Subjective: AFVSS on RA, labs wnl, adequate UoP, NG tube removed last evening, tolerating clears. Abdomen soft, incisions c/d/I.   Objective: Vital signs in last 24 hours: Temp:  [97.7 F (36.5 C)-99.2 F (37.3 C)] 97.7 F (36.5 C) (01/22 0547) Pulse Rate:  [38-77] 70 (01/22 0547) Resp:  [12-18] 18 (01/22 0547) BP: (138-168)/(60-79) 138/60 (01/22 0547) SpO2:  [94 %-100 %] 94 % (01/22 0547)  Assessment/Plan:  -Trial regular diet, medlock -Continue ambulation  -Bowel regimen -Anticipate DC tomorrow AM   Intake/Output from previous day: 01/21 0701 - 01/22 0700 In: 1123.5 [P.O.:120; I.V.:882.1; NG/GT:60; IV Piggyback:61.4] Out: 1575 [Urine:1575]  Intake/Output this shift: No intake/output data recorded.  Physical Exam:  General: Alert and oriented CV: No cyanosis Lungs: equal chest rise Abdomen: Soft, NTND, no rebound or guarding. Incisions c/d/i Gu: Voiding spontaneously   Lab Results: Recent Labs    10/14/24 1353 10/15/24 0534 10/16/24 0543  HGB 12.3 11.0* 10.9*  HCT 36.4 32.3* 33.0*   BMET Recent Labs    10/15/24 0534 10/16/24 0543  NA 140 138  K 4.0 3.3*  CL 100 102  CO2 31 28  GLUCOSE 115* 92  BUN 13 17  CREATININE 0.93 0.95  CALCIUM  9.5 9.1     Studies/Results: CT ABDOMEN PELVIS WO CONTRAST Result Date: 10/14/2024 EXAM: CT ABDOMEN AND PELVIS WITHOUT CONTRAST 10/14/2024 01:36:57 PM TECHNIQUE: CT of the abdomen and pelvis was performed without the administration of intravenous contrast. Multiplanar reformatted images are provided for review. Automated exposure control, iterative reconstruction, and/or weight-based adjustment of the mA/kV was utilized to reduce the radiation dose to as low as reasonably achievable. COMPARISON: CT scan dated 08/15/2024. CLINICAL HISTORY: Abdominal pain and nausea, postoperative day 4 status post left ureteroureterostomy, nephropexy, and left ureteral stent placement. FINDINGS: LOWER CHEST: Pleural  effusion with mild atelectasis in the left lower lobe. Descending thoracic aortic and right coronary artery atheromatous vascular calcification. LIVER: The liver is unremarkable. GALLBLADDER AND BILE DUCTS: Contracted gallbladder. No biliary ductal dilatation. SPLEEN: New peripheral triangular hypodense lesion in the posterior superior spleen on image 118 series 7, possibly fluid along a lobulation of the spleen which is somewhat shifted in position compared to previous, but a small superior splenic laceration or infarct cannot be excluded on image 19 series 2. Small amount of extraluminal gas immediately below the spleen. PANCREAS: Atrophic pancreas. ADRENAL GLANDS: No acute abnormality. KIDNEYS, URETERS AND BLADDER: The left double J ureteral stent is present with proximal and distal loops formed in the left renal pelvis and urinary bladder, respectively. About 10 nonobstructive left renal calculi are present, 1 of the largest in the lower pole measuring 1.0 cm in long axis. 3 nonobstructive right renal calculi measure up to 7 mm in long axis. No current hydronephrosis. There is left periureteral stranding but no obvious large fluid collection in the immediate vicinity of the left ureter. Trace amount of gas in the left perirenal space and left retroperitoneum. Urinary bladder is unremarkable. GI AND BOWEL: Substantially dilated stomach. Dilated loops of jejunum extend to a transition point in the left abdomen around image 48 of series 2. On coronal multiplanar reconstructed images 53 through 59 of series 7, there is suspicion for herniation of the jejunum at the transition point through the lateral abdominal wall musculature as a potential lead point for obstruction, with loops distal to this hernia noted not dilated. The appearance is most compatible with a small bowel obstruction due to hernia. The herniated bowel  is amorphous and also surrounded by subcutaneous emphysema, making it difficult to assess the  bowel separate from the regional musculature, with the surrounding subcutaneous emphysema also complicating the picture. I favor that the subcutaneous emphysema is likely related to the recent laparoscopy rather than necessarily being due to perforation of the herniated bowel sac given the volume of gas involved and its distribution. Sigmoid colon diverticulosis. PERITONEUM AND RETROPERITONEUM: Small amount of free pelvic fluid. Faint stranding in the omentum. Trace amount of pneumoperitoneum. Trace amount of gas in the left perirenal space and left retroperitoneum. VASCULATURE: Aorta is normal in caliber. Systemic atherosclerosis is present, including the aorta and iliac arteries. LYMPH NODES: No lymphadenopathy. REPRODUCTIVE ORGANS: No acute abnormality. BONES AND SOFT TISSUES: Lumbar spondylosis and degenerative disc disease. Substantial subcutaneous gas along the anterior abdominal wall and left flank breaking all the way down into the labia from the chest region. A small amount of this gas dissects in the subxiphoid space into the fatty tissues adjacent to the pericardium. IMPRESSION: 1. Acute small bowel obstruction from herniation of distal jejunum through a left lateral abdominal wall defect. 2. New peripheral triangular hypodense lesion in the posterior superior spleen, possibly from mild ascites outlining lobulation, but a small splenic laceration or splenic infarct cannot be excluded. 3. Extensive subcutaneous emphysema along the anterior abdominal wall and left flank extending into the labia, with trace pneumoperitoneum and trace gas extending into the subxiphoid fat adjacent to the pericardium ;much of this is thought to be postoperative. 4. Left double j ureteral stent in place without hydronephrosis, with left periureteral stranding and trace gas in the left perirenal space and retroperitoneum, without a large adjacent fluid collection. 5. Bilateral nonobstructive renal calculi. 6. Small left pleural  effusion with mild left lower lobe atelectasis. 7. Sigmoid colon diverticulosis. 8. Systemic atherosclerosis. Electronically signed by: Ryan Salvage MD 10/14/2024 02:00 PM EST RP Workstation: [id number]      LOS: 2 days    Dr. Alvaro is the attending of record for this consult.    10/16/2024, 7:54 AM     I have seen and examined the patient and agree with Dr. Johnie plan.   Briefly,   S:   1 - Small Bowel Obstruction From Strangulated Port-Site Hernia - s/p emergent laparosopic port site hernia repair 1/20. Bowel viable. NGT initiall post-op. Clamped 1/21 AM, removed 1/21 PM and clears started as output scant. Reg diet 1/22 AM.    2- Left High Grade Ureteral Stricture - s/p LEFT robotic U-U 10/10/24. Stent in good position, no complicating factors on ER CT 1/20.   Today Jamika is improving. NO nausea / emesis. Tolerating clears, some flatus.   O: NAD NLB-RA SNTND, recent port sites c/d/I No c/c/e   A/P:   Doing well POD 2 s/p emergent port site hernia repair. Discussed goals of hospital stay (getting back to regular diet and ambulation), likely DC Fri AM based on current progress.    Myrbetriq  for stent-related bladder spasms.    "

## 2024-10-16 NOTE — TOC Initial Note (Signed)
 Transition of Care [facility]) - Initial/Assessment Note    Patient Details  Name: [name] MRN: [id number] Date of Birth: Sep 18, 1956  [facility]) CM/SW Contact:    Bascom Service, RN Phone Number: 10/16/2024, 3:04 PM  Clinical Narrative: d/c plan home.Has own transport home.                  Expected Discharge Plan: Home/Self Care Barriers to Discharge: Continued Medical Work up   Patient Goals and CMS Choice Patient states their goals for this hospitalization and ongoing recovery are:: Home CMS Medicare.gov Compare Post Acute Care list provided to:: Patient Choice offered to / list presented to : Patient Foster ownership interest in [facility].provided to:: Patient    Expected Discharge Plan and Services   Discharge Planning Services: CM Consult   Living arrangements for the past 2 months: Single Family Home                                      Prior Living Arrangements/Services Living arrangements for the past 2 months: Single Family Home Lives with:: Relatives                   Activities of Daily Living   ADL Screening (condition at time of admission) Independently performs ADLs?: Yes (appropriate for developmental age) Is the patient deaf or have difficulty hearing?: Yes Does the patient have difficulty seeing, even when wearing glasses/contacts?: No Does the patient have difficulty concentrating, remembering, or making decisions?: No  Permission Sought/Granted                  Emotional Assessment              Admission diagnosis:  SBO (small bowel obstruction) (HCC) [K56.609] Small bowel obstruction (HCC) [K56.609] Patient Active Problem List   Diagnosis Date Noted   Small bowel obstruction (HCC) 10/14/2024   Ureteral stricture 10/10/2024   Bilateral carpal tunnel syndrome 06/06/2024   Pain in both lower extremities 04/11/2024   Paresthesia 04/11/2024   Constipation 04/09/2024   Coronary artery  disease involving native coronary artery of native heart without angina pectoris 04/09/2024   Kidney stone 04/09/2024   Research subject 04/09/2024   Staphyloma posticum of both eyes 04/09/2024   Unintentional weight loss 04/09/2024   Atrophy of muscle of both lower legs 02/12/2024   Pneumonia 05/31/2021   Plantar fasciitis of right foot 03/25/2020   Impaired fasting glucose 10/28/2019   Anemia 06/26/2019   Congenital anomaly of nervous system (HCC) 07/31/2018   Chronic right shoulder pain 05/09/2018   Exudative age-related macular degeneration of right eye with active choroidal neovascularization (HCC) 02/02/2018   Frequency of micturition 12/18/2017   Restless legs syndrome 12/18/2017   Status post right knee replacement 11/08/2017   Sensorineural hearing loss (SNHL), bilateral 10/11/2017   Osteoarthritis of knee 10/04/2017   Benign paroxysmal positional vertigo 10/03/2017   Vertigo 09/25/2017   Gastro-esophageal reflux disease without esophagitis 09/25/2017   Obesity 09/25/2017   Intractable vomiting with nausea    Memory loss 05/21/2017   Long term current use of therapeutic drug 05/02/2017   Acute low back pain with sciatica 12/08/2016   Posterior vitreous detachment, bilateral 08/15/2016   Disorder of bone 06/22/2016   Osteoarthritis of lumbar spine with myelopathy 12/07/2015   Abnormality of gait 12/07/2015   Degenerative lumbar spinal stenosis 12/01/2015   High  risk medication use 11/09/2015   Cervical stenosis of spinal canal 11/02/2015   Low back pain 10/19/2015   Spinal stenosis in cervical region 10/19/2015   Bilateral hip pain 10/19/2015   Obstructive sleep apnea syndrome 10/08/2015   Cervical spondylosis without myelopathy 08/10/2015   Excessive daytime sleepiness 08/10/2015   Right optic neuritis 08/10/2015   Abnormal finding on MRI of brain 08/10/2015   Cataract of left eye 08/03/2015   Hyperlipidemia 07/30/2015   Carotid artery occlusion 07/30/2015    Encounter for examination for normal comparison and control in clinical research program 07/30/2015   Stenosis of carotid artery 07/21/2015   Abnormal glucose level 07/21/2015   Hypertensive retinopathy of both eyes 07/19/2015   Right retinal detachment 07/19/2015   Retinal edema 07/19/2015   Peripheral vascular disease 07/10/2015   Osteoarthritis 07/10/2015   Chest pain 01/21/2015   Angina pectoris 01/21/2015   Pseudophakia 12/22/2013   Pseudophakia of both eyes 12/22/2013   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 [place] [address] Phone: [phone] Fax: [phone]     Social Drivers of Health (SDOH) Social History: SDOH Screenings   Food Insecurity: No Food Insecurity (10/14/2024)  Housing: Low Risk (10/14/2024)  Transportation Needs: No Transportation Needs (10/14/2024)  Utilities: Not At Risk (10/14/2024)  Social Connections: Moderately Integrated (10/14/2024)  Tobacco Use: Low Risk (10/14/2024)   SDOH Interventions:     Readmission Risk Interventions     No data to display

## 2024-10-17 ENCOUNTER — Other Ambulatory Visit (HOSPITAL_COMMUNITY): Payer: Self-pay

## 2024-10-17 LAB — CBC
HCT: 33 % — ABNORMAL LOW (ref 36.0–46.0)
Hemoglobin: 11 g/dL — ABNORMAL LOW (ref 12.0–15.0)
MCH: 33.1 pg (ref 26.0–34.0)
MCHC: 33.3 g/dL (ref 30.0–36.0)
MCV: 99.4 fL (ref 80.0–100.0)
Platelets: 220 K/uL (ref 150–400)
RBC: 3.32 MIL/uL — ABNORMAL LOW (ref 3.87–5.11)
RDW: 12.6 % (ref 11.5–15.5)
WBC: 7.6 K/uL (ref 4.0–10.5)
nRBC: 0 % (ref 0.0–0.2)

## 2024-10-17 LAB — "CREATININE, BODY FLUID OTHER       ": Creatinine, Body Fluid: 1 mg/dL

## 2024-10-17 LAB — BASIC METABOLIC PANEL WITH GFR
Anion gap: 8 (ref 5–15)
BUN: 23 mg/dL (ref 8–23)
CO2: 29 mmol/L (ref 22–32)
Calcium: 9.1 mg/dL (ref 8.9–10.3)
Chloride: 104 mmol/L (ref 98–111)
Creatinine, Ser: 1.04 mg/dL — ABNORMAL HIGH (ref 0.44–1.00)
GFR, Estimated: 58 mL/min — ABNORMAL LOW
Glucose, Bld: 99 mg/dL (ref 70–99)
Potassium: 3.3 mmol/L — ABNORMAL LOW (ref 3.5–5.1)
Sodium: 140 mmol/L (ref 135–145)

## 2024-10-17 MED ORDER — TRAMADOL HCL 50 MG PO TABS
50.0000 mg | ORAL_TABLET | Freq: Four times a day (QID) | ORAL | 0 refills | Status: AC | PRN
  Filled 2024-10-17: qty 15, 2d supply, fill #0

## 2024-10-17 NOTE — Discharge Summary (Signed)
 Date of admission: 10/14/2024  Date of discharge: 10/17/2024  Admission diagnosis: Small bowel obstruction  Discharge diagnosis: Small bowel obstruction  Secondary diagnoses: None  History and Physical and Hospital Course: For full details, please see admission history and physical. Briefly, [name] is a 69 y.o. year old patient who had recent left UU 10/10/24 who presented with SBO 2/2 port site hernia s/p port site hernia repair on 1/20 with viable bowel. She had an NG tube placed and tolerated clamping thus it was removed on 1/21, she tolerated progression of her diet and tolerated a regular diet on 1/23 with flatus. Overall by discharge was tolerating a regular diet, pain was controlled on oral meds, she was ambulating without difficulty. She will follow-up as scheduled.    Laboratory values:  Recent Labs    10/15/24 0534 10/16/24 0543 10/17/24 0547  HGB 11.0* 10.9* 11.0*  HCT 32.3* 33.0* 33.0*   Recent Labs    10/16/24 0543 10/17/24 0547  CREATININE 0.95 1.04*    Disposition: Home  Discharge instruction: The patient was instructed to be ambulatory but told to refrain from heavy lifting, strenuous activity, or driving.   Discharge medications:  Allergies as of 10/17/2024       Reactions   Ezetimibe Other (See Comments)   Myalgia   Prednisone  Anxiety, Other (See Comments)   Sleep disturbances, Agitation, Confusion   Rosuvastatin Other (See Comments)   Myalgia        Medication List     TAKE these medications    rOPINIRole  0.5 MG tablet Commonly known as: REQUIP  Take 0.5 mg by mouth at bedtime. The timing of this medication is very important.   aspirin  EC 81 MG tablet Take 81 mg by mouth in the morning. Swallow whole.   azaTHIOprine  50 MG tablet Commonly known as: IMURAN  Take 1 tablet (50 mg total) by mouth daily.   carvedilol  12.5 MG tablet Commonly known as: COREG  Take 6.25 mg by mouth 2 (two) times daily with a meal.   Centrum Silver Women  50+ Tabs Take 1 tablet by mouth daily.   chlorthalidone  25 MG tablet Commonly known as: HYGROTON  Take 25 mg by mouth every Monday, Wednesday, and Friday. In the morning.   cholecalciferol  25 MCG (1000 UNIT) tablet Commonly known as: VITAMIN D3 Take 1,000 Units by mouth in the morning.   cyanocobalamin 1000 MCG tablet Commonly known as: VITAMIN B12 Take 1,000 mcg by mouth at bedtime.   docusate sodium  100 MG capsule Commonly known as: COLACE Take 100 mg by mouth at bedtime.   gabapentin  300 MG capsule Commonly known as: NEURONTIN  Take 300-600 mg by mouth See admin instructions. Take 1 capsule (300 mg) by mouth every morning & take 2 capsules (600 mg) by mouth every night.   melatonin 5 MG Tabs Take 10 mg by mouth at bedtime.   Na Sulfate-K Sulfate-Mg Sulfate concentrate 17.5-3.13-1.6 GM/177ML Soln Commonly known as: SUPREP Take 1 kit by mouth once.   omeprazole 20 MG capsule Commonly known as: PRILOSEC Take 20-40 mg by mouth daily as needed (Heartburn).   ondansetron  4 MG tablet Commonly known as: ZOFRAN  Take 4 mg by mouth every 8 (eight) hours as needed for nausea or vomiting.   OVER THE COUNTER MEDICATION Take 1 tablet by mouth at bedtime. Opti Gold   oxyCODONE -acetaminophen  5-325 MG tablet Commonly known as: Percocet Take 1 tablet by mouth every 6 (six) hours as needed for moderate pain (pain score 4-6) or severe pain (pain score  7-10). Post-operatively.   polyethylene glycol powder 17 GM/SCOOP powder Commonly known as: GLYCOLAX /MIRALAX  Take 17 g by mouth daily as needed (constipation.).   psyllium 0.52 g capsule Commonly known as: REGULOID Take 0.52 g by mouth daily.   senna-docusate 8.6-50 MG tablet Commonly known as: Senokot-S Take 1 tablet by mouth 2 (two) times daily. While taking strong pain meds to prevent constipation.   tamsulosin  0.4 MG Caps capsule Commonly known as: FLOMAX  Take 0.4 mg by mouth at bedtime.   traMADol  50 MG tablet Commonly  known as: ULTRAM  Take 1-2 tablets (50-100 mg total) by mouth every 6 (six) hours as needed for moderate pain (pain score 4-6) or severe pain (pain score 7-10). Post-operatively What changed:  how much to take reasons to take this additional instructions   VABYSMO  IZ 1 Dose by Intravitreal route every 6 (six) weeks. Right eye   VICTORION-1 PREVENT inclisiran or placebo 300 mg/1.5 mL SQ injection Inject 300 mg into the skin every 6 (six) months. For Investigational Use Only. Inject subcutaneously into the abdomen, upper arm or thigh every 6 months. Do not inject into areas of active skin disease,such as sun burns, skin rashes, inflammation, or skin infections.Please contact [facility] for any questions or concerns.        Followup:  Follow-up next week for post-op check as previously scheduled

## 2024-10-17 NOTE — Progress Notes (Signed)
 Patient alert and oriented 4. Patient reported a restless night prior and expressed desire to rest. Ambulated in hallway approximately 250 feet with walker with one assists ; tolerated well. Melatonin 10 mg administered. Lavender essential oil given with consent, ear plugs, and box fan used to promote sleep/white noise. Patient tolerated interventions well and was able to rest. Pain controlled. No distress noted. Plan of care ongoing.

## 2024-11-28 ENCOUNTER — Encounter: Admitting: Gastroenterology

## 2025-03-09 ENCOUNTER — Encounter

## 2025-04-08 ENCOUNTER — Encounter
